# Patient Record
Sex: Male | Born: 1945
Health system: Southern US, Community
[De-identification: ages and names within clinical notes are randomized; demographics above are authoritative.]

## PROBLEM LIST (undated history)

## (undated) DIAGNOSIS — J111 Influenza due to unidentified influenza virus with other respiratory manifestations: Secondary | ICD-10-CM

## (undated) DIAGNOSIS — M545 Low back pain, unspecified: Secondary | ICD-10-CM

## (undated) DIAGNOSIS — M1990 Unspecified osteoarthritis, unspecified site: Secondary | ICD-10-CM

## (undated) DIAGNOSIS — K649 Unspecified hemorrhoids: Secondary | ICD-10-CM

## (undated) DIAGNOSIS — J329 Chronic sinusitis, unspecified: Secondary | ICD-10-CM

## (undated) DIAGNOSIS — K589 Irritable bowel syndrome without diarrhea: Secondary | ICD-10-CM

## (undated) DIAGNOSIS — I4581 Long QT syndrome: Secondary | ICD-10-CM

## (undated) DIAGNOSIS — E785 Hyperlipidemia, unspecified: Secondary | ICD-10-CM

## (undated) DIAGNOSIS — R55 Syncope and collapse: Secondary | ICD-10-CM

## (undated) HISTORY — PX: APPENDECTOMY: SHX54

## (undated) HISTORY — DX: Low back pain: M54.5

## (undated) HISTORY — DX: Hyperlipidemia, unspecified: E78.5

## (undated) HISTORY — DX: Unspecified osteoarthritis, unspecified site: M19.90

## (undated) HISTORY — PX: EYE SURGERY: SHX253

## (undated) HISTORY — PX: POLYPECTOMY: SHX149

## (undated) HISTORY — PX: WISDOM TOOTH EXTRACTION: SHX21

## (undated) HISTORY — DX: Long QT syndrome: I45.81

## (undated) HISTORY — DX: Irritable bowel syndrome, unspecified: K58.9

## (undated) HISTORY — PX: HYPERPLASIA TISSUE EXCISION: SHX5201

## (undated) HISTORY — DX: Unspecified hemorrhoids: K64.9

## (undated) HISTORY — PX: TONSILLECTOMY: SUR1361

## (undated) HISTORY — DX: Low back pain, unspecified: M54.50

---

## 1993-12-31 DIAGNOSIS — R55 Syncope and collapse: Secondary | ICD-10-CM

## 1993-12-31 HISTORY — DX: Syncope and collapse: R55

## 2004-05-17 ENCOUNTER — Encounter: Payer: Self-pay | Admitting: Internal Medicine

## 2004-12-01 ENCOUNTER — Ambulatory Visit: Payer: Self-pay | Admitting: Internal Medicine

## 2005-02-23 ENCOUNTER — Ambulatory Visit: Payer: Self-pay | Admitting: Internal Medicine

## 2005-03-27 ENCOUNTER — Ambulatory Visit: Payer: Self-pay | Admitting: Internal Medicine

## 2005-04-04 ENCOUNTER — Ambulatory Visit: Payer: Self-pay | Admitting: Internal Medicine

## 2005-04-05 ENCOUNTER — Ambulatory Visit: Payer: Self-pay | Admitting: Internal Medicine

## 2005-04-19 ENCOUNTER — Ambulatory Visit: Payer: Self-pay | Admitting: Internal Medicine

## 2005-04-30 ENCOUNTER — Ambulatory Visit: Payer: Self-pay | Admitting: Internal Medicine

## 2005-05-18 ENCOUNTER — Ambulatory Visit: Payer: Self-pay | Admitting: Internal Medicine

## 2005-06-01 ENCOUNTER — Ambulatory Visit: Payer: Self-pay | Admitting: Internal Medicine

## 2005-11-16 ENCOUNTER — Ambulatory Visit: Payer: Self-pay | Admitting: Internal Medicine

## 2005-11-16 ENCOUNTER — Emergency Department (HOSPITAL_COMMUNITY): Admission: EM | Admit: 2005-11-16 | Discharge: 2005-11-17 | Payer: Self-pay | Admitting: Emergency Medicine

## 2005-12-07 ENCOUNTER — Ambulatory Visit: Payer: Self-pay | Admitting: Internal Medicine

## 2005-12-12 ENCOUNTER — Ambulatory Visit (HOSPITAL_COMMUNITY): Admission: RE | Admit: 2005-12-12 | Discharge: 2005-12-12 | Payer: Self-pay | Admitting: Internal Medicine

## 2005-12-14 ENCOUNTER — Ambulatory Visit: Payer: Self-pay

## 2005-12-14 ENCOUNTER — Encounter: Payer: Self-pay | Admitting: Internal Medicine

## 2005-12-14 ENCOUNTER — Encounter: Payer: Self-pay | Admitting: Cardiology

## 2006-01-12 ENCOUNTER — Encounter: Admission: RE | Admit: 2006-01-12 | Discharge: 2006-01-12 | Payer: Self-pay | Admitting: *Deleted

## 2006-02-28 ENCOUNTER — Ambulatory Visit: Payer: Self-pay | Admitting: Internal Medicine

## 2006-03-18 ENCOUNTER — Ambulatory Visit: Payer: Self-pay | Admitting: Internal Medicine

## 2006-03-25 ENCOUNTER — Ambulatory Visit: Payer: Self-pay

## 2006-04-29 ENCOUNTER — Ambulatory Visit: Payer: Self-pay | Admitting: Internal Medicine

## 2006-06-18 ENCOUNTER — Ambulatory Visit: Payer: Self-pay | Admitting: Internal Medicine

## 2006-06-25 ENCOUNTER — Ambulatory Visit: Payer: Self-pay | Admitting: Internal Medicine

## 2006-12-31 HISTORY — PX: COLONOSCOPY: SHX174

## 2007-04-02 ENCOUNTER — Ambulatory Visit: Payer: Self-pay | Admitting: Internal Medicine

## 2007-04-04 ENCOUNTER — Ambulatory Visit: Payer: Self-pay | Admitting: Internal Medicine

## 2007-09-22 ENCOUNTER — Ambulatory Visit: Payer: Self-pay | Admitting: Internal Medicine

## 2007-09-22 LAB — CONVERTED CEMR LAB
AST: 60 units/L — ABNORMAL HIGH (ref 0–37)
Albumin: 4.2 g/dL (ref 3.5–5.2)
Alkaline Phosphatase: 62 units/L (ref 39–117)
Basophils Absolute: 0 10*3/uL (ref 0.0–0.1)
Basophils Relative: 0 % (ref 0.0–1.0)
Blood in Urine, dipstick: NEGATIVE
CO2: 32 meq/L (ref 19–32)
Chloride: 109 meq/L (ref 96–112)
Creatinine, Ser: 0.9 mg/dL (ref 0.4–1.5)
HCT: 43.9 % (ref 39.0–52.0)
MCHC: 33.9 g/dL (ref 30.0–36.0)
Neutrophils Relative %: 52.9 % (ref 43.0–77.0)
Nitrite: NEGATIVE
PSA: 0.67 ng/mL (ref 0.10–4.00)
Protein, U semiquant: NEGATIVE
RBC: 4.56 M/uL (ref 4.22–5.81)
RDW: 12.7 % (ref 11.5–14.6)
Sodium: 145 meq/L (ref 135–145)
Total Bilirubin: 1.2 mg/dL (ref 0.3–1.2)
Total CHOL/HDL Ratio: 3.1
Triglycerides: 58 mg/dL (ref 0–149)
Urobilinogen, UA: 0.2
VLDL: 12 mg/dL (ref 0–40)
WBC Urine, dipstick: NEGATIVE
WBC: 5.3 10*3/uL (ref 4.5–10.5)
pH: 8.5

## 2007-09-24 DIAGNOSIS — M545 Low back pain: Secondary | ICD-10-CM

## 2007-09-29 ENCOUNTER — Ambulatory Visit: Payer: Self-pay | Admitting: Internal Medicine

## 2007-09-29 DIAGNOSIS — E785 Hyperlipidemia, unspecified: Secondary | ICD-10-CM

## 2007-09-29 DIAGNOSIS — R945 Abnormal results of liver function studies: Secondary | ICD-10-CM | POA: Insufficient documentation

## 2007-09-29 LAB — CONVERTED CEMR LAB
ALT: 58 units/L — ABNORMAL HIGH (ref 0–53)
Alkaline Phosphatase: 69 units/L (ref 39–117)
HDL goal, serum: 40 mg/dL
LDL Goal: 130 mg/dL

## 2007-10-15 ENCOUNTER — Ambulatory Visit: Payer: Self-pay | Admitting: Gastroenterology

## 2007-12-11 ENCOUNTER — Ambulatory Visit: Payer: Self-pay | Admitting: Gastroenterology

## 2007-12-11 ENCOUNTER — Encounter: Payer: Self-pay | Admitting: Internal Medicine

## 2008-09-22 ENCOUNTER — Ambulatory Visit: Payer: Self-pay | Admitting: Internal Medicine

## 2008-09-22 LAB — CONVERTED CEMR LAB
Alkaline Phosphatase: 57 units/L (ref 39–117)
Bilirubin Urine: NEGATIVE
Bilirubin, Direct: 0.1 mg/dL (ref 0.0–0.3)
Cholesterol: 234 mg/dL (ref 0–200)
GFR calc Af Amer: 97 mL/min
Glucose, Bld: 88 mg/dL (ref 70–99)
HCT: 48.2 % (ref 39.0–52.0)
Ketones, urine, test strip: NEGATIVE
Lymphocytes Relative: 26.4 % (ref 12.0–46.0)
Monocytes Absolute: 0.7 10*3/uL (ref 0.1–1.0)
Monocytes Relative: 10.2 % (ref 3.0–12.0)
Platelets: 233 10*3/uL (ref 150–400)
Potassium: 4.3 meq/L (ref 3.5–5.1)
Protein, U semiquant: NEGATIVE
RDW: 12.2 % (ref 11.5–14.6)
Sodium: 143 meq/L (ref 135–145)
TSH: 1.8 microintl units/mL (ref 0.35–5.50)
Total Bilirubin: 1 mg/dL (ref 0.3–1.2)
Total CHOL/HDL Ratio: 2.7
Total Protein: 7.2 g/dL (ref 6.0–8.3)
Triglycerides: 55 mg/dL (ref 0–149)
Urobilinogen, UA: 0.2
VLDL: 11 mg/dL (ref 0–40)

## 2008-10-01 ENCOUNTER — Ambulatory Visit: Payer: Self-pay | Admitting: Internal Medicine

## 2008-10-01 DIAGNOSIS — R55 Syncope and collapse: Secondary | ICD-10-CM

## 2009-05-06 ENCOUNTER — Encounter: Payer: Self-pay | Admitting: *Deleted

## 2009-05-06 ENCOUNTER — Encounter: Payer: Self-pay | Admitting: Internal Medicine

## 2009-11-04 ENCOUNTER — Ambulatory Visit: Payer: Self-pay | Admitting: Internal Medicine

## 2009-11-04 LAB — CONVERTED CEMR LAB
ALT: 37 units/L (ref 0–53)
Albumin: 4.6 g/dL (ref 3.5–5.2)
Basophils Relative: 0.1 % (ref 0.0–3.0)
Bilirubin Urine: NEGATIVE
Bilirubin, Direct: 0.1 mg/dL (ref 0.0–0.3)
CO2: 30 meq/L (ref 19–32)
Chloride: 107 meq/L (ref 96–112)
Cholesterol: 240 mg/dL — ABNORMAL HIGH (ref 0–200)
Eosinophils Relative: 0.8 % (ref 0.0–5.0)
Glucose, Urine, Semiquant: NEGATIVE
HCT: 49.5 % (ref 39.0–52.0)
HDL: 81 mg/dL (ref >39.00)
Hemoglobin: 16.8 g/dL (ref 13.0–17.0)
Ketones, urine, test strip: NEGATIVE
MCV: 100.1 fL — ABNORMAL HIGH (ref 78.0–100.0)
Monocytes Absolute: 0.8 10*3/uL (ref 0.1–1.0)
Neutro Abs: 4.5 10*3/uL (ref 1.4–7.7)
Neutrophils Relative %: 62.2 % (ref 43.0–77.0)
Nitrite: NEGATIVE
PSA: 1.01 ng/mL (ref 0.10–4.00)
RBC: 4.95 M/uL (ref 4.22–5.81)
Specific Gravity, Urine: 1.02
Total Protein: 7.1 g/dL (ref 6.0–8.3)
Triglycerides: 67 mg/dL (ref 0.0–149.0)
WBC: 7.2 10*3/uL (ref 4.5–10.5)
pH: 6

## 2009-11-22 ENCOUNTER — Ambulatory Visit: Payer: Self-pay | Admitting: Internal Medicine

## 2010-02-24 ENCOUNTER — Telehealth: Payer: Self-pay | Admitting: Internal Medicine

## 2010-04-19 ENCOUNTER — Ambulatory Visit: Payer: Self-pay | Admitting: Internal Medicine

## 2010-04-19 DIAGNOSIS — F5102 Adjustment insomnia: Secondary | ICD-10-CM

## 2010-04-26 ENCOUNTER — Telehealth: Payer: Self-pay | Admitting: Internal Medicine

## 2010-05-02 ENCOUNTER — Encounter: Payer: Self-pay | Admitting: Internal Medicine

## 2011-01-30 NOTE — Miscellaneous (Signed)
Summary: [facility]  [facility]   Imported By: Maryln Gottron 05/16/2010 09:22:54  _____________________________________________________________________  External Attachment:    Type:   Image     Comment:   External Document

## 2011-01-30 NOTE — Assessment & Plan Note (Signed)
Summary: BACK PAIN // RS   Vital Signs:  Patient profile:   65 year old male Height:      69 inches Weight:      164 pounds BMI:     24.31 Temp:     98.2 degrees F oral Pulse rate:   68 / minute Resp:     14 per minute BP sitting:   120 / 76  (left arm)  Vitals Entered By: Willy Eddy, LPN (April 19, 2010 2:41 PM) CC: c/o low back pain after reaching over to pick up a book at work on 4-14-, Back Pain   CC:  c/o low back pain after reaching over to pick up a book at work on 4-14- and Back Pain.  History of Present Illness: last thirsday at work reached and felt a pain in the lower back to IBU and used heat and this has improved over 6 days The pt also sufffers with insomnia and the pain has increased this problem  Back Pain History:      The patient's back pain started approximately 04/13/2010.  The pain is located in the lower back region and does not radiate below the knees.  He states this is work related.  On a scale of 1-10, he describes the pain as a 6.  He states that he has no prior history of back pain.  The patient has not had any recent physical therapy for his back pain.     Preventive Screening-Counseling & Management  Alcohol-Tobacco     Smoking Status: quit     Year Quit: 1978  Problems Prior to Update: 1)  Syncope  (ICD-780.2) 2)  Abnormal Result, Function Study, Liver  (ICD-794.8) 3)  Hyperlipidemia  (ICD-272.4) 4)  Low Back Pain  (ICD-724.2) 5)  Physical Examination  (ICD-V70.0)  Current Problems (verified): 1)  Syncope  (ICD-780.2) 2)  Abnormal Result, Function Study, Liver  (ICD-794.8) 3)  Hyperlipidemia  (ICD-272.4) 4)  Low Back Pain  (ICD-724.2) 5)  Physical Examination  (ICD-V70.0)  Medications Prior to Update: 1)  Advil 100 Mg  Tabs (Ibuprofen) .... As Needed 2)  Melatonin 3 Mg Tabs (Melatonin) .Marland Kitchen.. 1 At Bedtime As Needed Sleep 3)  Zolpidem Tartrate 5 Mg Tabs (Zolpidem Tartrate) .... One By Mouth Q Hs As Needed Insomnia  Current  Medications (verified): 1)  Advil 100 Mg  Tabs (Ibuprofen) .... As Needed 2)  Melatonin 3 Mg Tabs (Melatonin) .Marland Kitchen.. 1 At Bedtime As Needed Sleep 3)  Zolpidem Tartrate 5 Mg Tabs (Zolpidem Tartrate) .... One By Mouth Q Hs As Needed Insomnia  Allergies (verified): 1)  ! * Mycins  Past History:  Family History: Last updated: 09/29/2007 Family History of Arthritis Family History Hypertension Family History of Prostate CA 1st degree relative <50  Social History: Last updated: 09/24/2007 Occupation: Married Current Smoker Alcohol use-yes Drug use-no  Risk Factors: Smoking Status: quit (04/19/2010)  Past medical, surgical, family and social histories (including risk factors) reviewed, and no changes noted (except as noted below).  Past Medical History: Reviewed history from 09/29/2007 and no changes required. IBS Hemorrhoid Low back pain Hyperlipidemia  Past Surgical History: Reviewed history from 09/24/2007 and no changes required. Flexible Sigmoidoscopy Appendectomy  Family History: Reviewed history from 09/29/2007 and no changes required. Family History of Arthritis Family History Hypertension Family History of Prostate CA 1st degree relative <50  Social History: Reviewed history from 09/24/2007 and no changes required. Occupation: Married Current Smoker Alcohol use-yes Drug use-no  Review of  Systems  The patient denies anorexia, fever, weight loss, weight gain, vision loss, decreased hearing, hoarseness, chest pain, syncope, dyspnea on exertion, peripheral edema, prolonged cough, headaches, hemoptysis, abdominal pain, melena, hematochezia, severe indigestion/heartburn, hematuria, incontinence, genital sores, muscle weakness, suspicious skin lesions, transient blindness, difficulty walking, depression, unusual weight change, abnormal bleeding, enlarged lymph nodes, angioedema, and breast masses.         back pain  Physical Exam  General:   Well-developed,well-nourished,in no acute distress; alert,appropriate and cooperative throughout examination Eyes:  No corneal or conjunctival inflammation noted. EOMI. Perrla. Funduscopic exam benign, without hemorrhages, exudates or papilledema. Vision grossly normal. Chest Wall:  No deformities, masses, tenderness or gynecomastia noted. Lungs:  Normal respiratory effort, chest expands symmetrically. Lungs are clear to auscultation, no crackles or wheezes. Heart:  normal rate and regular rhythm.   Abdomen:  soft, non-tender, normal bowel sounds, and no distention.   Msk:  lumbar lordosis, SI joint tenderness, and trigger point tenderness.   Extremities:  No clubbing, cyanosis, edema, or deformity noted with normal full range of motion of all joints.   Neurologic:  No cranial nerve deficits noted. Station and gait are normal. Plantar reflexes are down-going bilaterally. DTRs are symmetrical throughout. Sensory, motor and coordinative functions appear intact.   Impression & Recommendations:  Problem # 1:  LOW BACK PAIN (ICD-724.2)  His updated medication list for this problem includes:    Advil 100 Mg Tabs (Ibuprofen) .Marland Kitchen... As needed  Discussed use of moist heat or ice, modified activities, medications, and stretching/strengthening exercises. Back care instructions given. To be seen in 2 weeks if no improvement; sooner if worsening of symptoms.   Orders: T-Lumbar Spine Comp w/[facility] [phone])  Problem # 2:  TRANSIENT DISORDER INITIATING/MAINTAINING SLEEP (ICD-307.41) the use of advil PM and somnapure  Complete Medication List: 1)  Advil 100 Mg Tabs (Ibuprofen) .... As needed 2)  Melatonin 3 Mg Tabs (Melatonin) .Marland Kitchen.. 1 at bedtime as needed sleep 3)  Zolpidem Tartrate 5 Mg Tabs (Zolpidem tartrate) .... One by mouth q hs as needed insomnia  Patient Instructions: 1)  get xray today and take the muscle relaxants at night for rest 2)  Most patients (90%) with low back pain will improve  with time (2-6 weeks). Keep active but avoid activities that are painful. Apply moist heat and/or ice to lower back several times a day.

## 2011-01-30 NOTE — Progress Notes (Signed)
Summary: Sleep RX  Phone Note Call from Patient   Caller: Patient Call For: Stacie Glaze MD Summary of Call: Pt is calling to ask Dr. Lovell Sheehan if he can have a RX for sleep. [facility] taking [facility]il [facility] and is not helping. [phone] Initial call taken by: Lynann Beaver CMA,  February 24, 2010 11:21 AM  Follow-up for Phone Call        per drjenkins- may have zolpidem5 mg # 20 1 at bedtime as needed sleep Follow-up by: Willy Eddy, LPN,  February 24, 2010 3:46 PM    New/Updated Medications: ZOLPIDEM TARTRATE 5 MG TABS (ZOLPIDEM TARTRATE) one by mouth q hs as needed insomnia Prescriptions: ZOLPIDEM TARTRATE 5 MG TABS (ZOLPIDEM TARTRATE) one by mouth q hs as needed insomnia  #20 x 1   Entered by:   Lynann Beaver CMA   Authorized by:   Stacie Glaze MD   Signed by:   Lynann Beaver CMA on 02/24/2010   Method used:   Telephoned to ...       [facility]  [phone]* (retail)       [address] [place], [place]  [address]       Ph: [phone] or [phone]       Fax: [phone]   RxID:   [id number]

## 2011-01-30 NOTE — Progress Notes (Signed)
Summary: PT  Phone Note Call from Patient Call back at Work Phone [phone]   Summary of Call: Have considered & want to have PT.   Next week unavailble Tues am.  Please set up. Initial call taken by: Rudy Jew, RN,  April 26, 2010 2:45 PM

## 2011-04-25 ENCOUNTER — Encounter: Payer: Self-pay | Admitting: Internal Medicine

## 2011-04-26 ENCOUNTER — Other Ambulatory Visit (INDEPENDENT_AMBULATORY_CARE_PROVIDER_SITE_OTHER): Payer: BC Managed Care – PPO | Admitting: Internal Medicine

## 2011-04-26 DIAGNOSIS — Z0000 Encounter for general adult medical examination without abnormal findings: Secondary | ICD-10-CM

## 2011-04-26 DIAGNOSIS — Z13220 Encounter for screening for lipoid disorders: Secondary | ICD-10-CM

## 2011-04-26 LAB — CBC WITH DIFFERENTIAL/PLATELET
Basophils Absolute: 0 10*3/uL (ref 0.0–0.1)
Basophils Relative: 0.3 % (ref 0.0–3.0)
Eosinophils Absolute: 0 10*3/uL (ref 0.0–0.7)
Eosinophils Relative: 0.8 % (ref 0.0–5.0)
HCT: 48.5 % (ref 39.0–52.0)
Hemoglobin: 16.7 g/dL (ref 13.0–17.0)
Lymphocytes Relative: 31.6 % (ref 12.0–46.0)
Lymphs Abs: 1.9 10*3/uL (ref 0.7–4.0)
MCHC: 34.5 g/dL (ref 30.0–36.0)
MCV: 97.6 fl (ref 78.0–100.0)
Monocytes Absolute: 0.7 10*3/uL (ref 0.1–1.0)
Monocytes Relative: 11.2 % (ref 3.0–12.0)
Neutro Abs: 3.4 10*3/uL (ref 1.4–7.7)
Neutrophils Relative %: 56.1 % (ref 43.0–77.0)
Platelets: 218 10*3/uL (ref 150.0–400.0)
RBC: 4.97 Mil/uL (ref 4.22–5.81)
RDW: 13.7 % (ref 11.5–14.6)
WBC: 6 10*3/uL (ref 4.5–10.5)

## 2011-04-26 LAB — BASIC METABOLIC PANEL
BUN: 17 mg/dL (ref 6–23)
CO2: 28 mEq/L (ref 19–32)
Calcium: 9.3 mg/dL (ref 8.4–10.5)
Chloride: 105 mEq/L (ref 96–112)
Creatinine, Ser: 1 mg/dL (ref 0.4–1.5)
GFR: 83.63 mL/min (ref >60.00)
Glucose, Bld: 88 mg/dL (ref 70–99)
Potassium: 5 mEq/L (ref 3.5–5.1)
Sodium: 143 mEq/L (ref 135–145)

## 2011-04-26 LAB — LDL CHOLESTEROL, DIRECT: Direct LDL: 140.9 mg/dL

## 2011-04-26 LAB — LIPID PANEL
Cholesterol: 245 mg/dL — ABNORMAL HIGH (ref 0–200)
HDL: 85.6 mg/dL (ref >39.00)
Total CHOL/HDL Ratio: 3
Triglycerides: 63 mg/dL (ref 0.0–149.0)
VLDL: 12.6 mg/dL (ref 0.0–40.0)

## 2011-04-26 LAB — POCT URINALYSIS DIPSTICK
Nitrite, UA: NEGATIVE
Urobilinogen, UA: 0.2
pH, UA: 5

## 2011-04-26 LAB — HEPATIC FUNCTION PANEL: Total Bilirubin: 1.2 mg/dL (ref 0.3–1.2)

## 2011-04-26 LAB — TSH: TSH: 2.21 u[IU]/mL (ref 0.35–5.50)

## 2011-05-03 ENCOUNTER — Ambulatory Visit (INDEPENDENT_AMBULATORY_CARE_PROVIDER_SITE_OTHER): Payer: BC Managed Care – PPO | Admitting: Internal Medicine

## 2011-05-03 ENCOUNTER — Encounter: Payer: Self-pay | Admitting: Internal Medicine

## 2011-05-03 VITALS — BP 116/74 | HR 72 | Temp 98.2°F | Resp 14 | Ht 70.0 in | Wt 168.0 lb

## 2011-05-03 DIAGNOSIS — Z0000 Encounter for general adult medical examination without abnormal findings: Secondary | ICD-10-CM

## 2011-05-03 DIAGNOSIS — L570 Actinic keratosis: Secondary | ICD-10-CM | POA: Insufficient documentation

## 2011-05-03 NOTE — Progress Notes (Signed)
  Subjective:    Patient ID: Kristopher Martinez, male    DOB: May 13, 1946, 65 y.o.   MRN: [id number]  HPI cpx Hearing loss And left leg pain with sitting and with use of the recumbent bike Has actinic keratosis on the forehead  Review of Systems  Constitutional: Negative for fever and fatigue.  HENT: Negative for hearing loss, congestion, neck pain and postnasal drip.   Eyes: Negative for discharge, redness and visual disturbance.  Respiratory: Negative for cough, shortness of breath and wheezing.   Cardiovascular: Negative for leg swelling.  Gastrointestinal: Negative for abdominal pain, constipation and abdominal distention.  Genitourinary: Negative for urgency and frequency.  Musculoskeletal: Positive for back pain and arthralgias. Negative for joint swelling.       Left leg stiff  Skin: Negative for color change and rash.  Neurological: Negative for weakness and light-headedness.  Hematological: Negative for adenopathy.  Psychiatric/Behavioral: Negative for behavioral problems.       Past Medical History  Diagnosis Date  . IBS (irritable bowel syndrome)   . Hemorrhoid   . Low back pain   . Hyperlipidemia    Past Surgical History  Procedure Date  . Appendectomy     reports that he quit smoking about 34 years ago. He does not have any smokeless tobacco history on file. He reports that he drinks alcohol. He reports that he does not use illicit drugs. family history includes Alcohol abuse in his mother; Cancer in his father; Colitis in his mother; and Prostate cancer in an unspecified family member. No Known Allergies  Objective:   Physical Exam  Constitutional: He is oriented to person, place, and time. He appears well-developed and well-nourished.  HENT:  Head: Normocephalic and atraumatic.  Eyes: Conjunctivae are normal. Pupils are equal, round, and reactive to light.  Neck: Normal range of motion. Neck supple.  Cardiovascular: Normal rate and regular rhythm.     Pulmonary/Chest: Effort normal and breath sounds normal.  Abdominal: Soft. Bowel sounds are normal.  Genitourinary: Prostate normal.  Musculoskeletal: Normal range of motion.  Neurological: He is alert and oriented to person, place, and time.  Skin: Skin is warm and dry.  Psychiatric: He has a normal mood and affect. His behavior is normal. Thought content normal.          Assessment & Plan:   Patient presents for yearly preventative medicine examination.   all immunizations and health maintenance protocols were reviewed with the patient and they are up to date with these protocols.   screening laboratory values were reviewed with the patient including screening of hyperlipidemia PSA renal function and hepatic function.   There medications past medical history social history problem list and allergies were reviewed in detail.   Goals were established with regard to weight loss exercise diet in compliance with medications

## 2011-05-03 NOTE — Assessment & Plan Note (Signed)
Patient has a 0.4 cm area of skin ulceration on his right for head is consistent with an actinic keratosis  Informed consent was obtained in the lesion was treated for 60 seconds of liquid nitrogen application the patient tolerated the procedure well as procedural care was discussed with the patient and instructions should the lesion reappears contact our office immediately

## 2011-12-05 ENCOUNTER — Ambulatory Visit (INDEPENDENT_AMBULATORY_CARE_PROVIDER_SITE_OTHER): Payer: Medicare Other | Admitting: Internal Medicine

## 2011-12-05 DIAGNOSIS — Z23 Encounter for immunization: Secondary | ICD-10-CM

## 2011-12-27 ENCOUNTER — Encounter: Payer: Self-pay | Admitting: Internal Medicine

## 2011-12-27 DIAGNOSIS — Z23 Encounter for immunization: Secondary | ICD-10-CM

## 2011-12-27 NOTE — Patient Instructions (Signed)
Shot given today

## 2012-05-08 ENCOUNTER — Encounter: Payer: Self-pay | Admitting: Internal Medicine

## 2012-05-08 ENCOUNTER — Ambulatory Visit (INDEPENDENT_AMBULATORY_CARE_PROVIDER_SITE_OTHER): Payer: Medicare Other | Admitting: Internal Medicine

## 2012-05-08 VITALS — BP 120/80 | HR 68 | Temp 98.0°F | Resp 14 | Ht 70.0 in | Wt 160.0 lb

## 2012-05-08 DIAGNOSIS — N400 Enlarged prostate without lower urinary tract symptoms: Secondary | ICD-10-CM

## 2012-05-08 DIAGNOSIS — E785 Hyperlipidemia, unspecified: Secondary | ICD-10-CM

## 2012-05-08 DIAGNOSIS — Z0000 Encounter for general adult medical examination without abnormal findings: Secondary | ICD-10-CM

## 2012-05-08 DIAGNOSIS — H9319 Tinnitus, unspecified ear: Secondary | ICD-10-CM

## 2012-05-08 DIAGNOSIS — T887XXA Unspecified adverse effect of drug or medicament, initial encounter: Secondary | ICD-10-CM

## 2012-05-08 LAB — LDL CHOLESTEROL, DIRECT: Direct LDL: 135.6 mg/dL

## 2012-05-08 LAB — CBC WITH DIFFERENTIAL/PLATELET
Basophils Relative: 0.3 % (ref 0.0–3.0)
Eosinophils Absolute: 0.1 10*3/uL (ref 0.0–0.7)
HCT: 47.3 % (ref 39.0–52.0)
Lymphs Abs: 1.7 10*3/uL (ref 0.7–4.0)
MCHC: 33.4 g/dL (ref 30.0–36.0)
MCV: 98.5 fl (ref 78.0–100.0)
Monocytes Absolute: 0.6 10*3/uL (ref 0.1–1.0)
Neutro Abs: 3 10*3/uL (ref 1.4–7.7)
Neutrophils Relative %: 55.7 % (ref 43.0–77.0)
RBC: 4.81 Mil/uL (ref 4.22–5.81)

## 2012-05-08 LAB — POCT URINALYSIS DIPSTICK
Leukocytes, UA: NEGATIVE
Nitrite, UA: NEGATIVE
Protein, UA: NEGATIVE
Urobilinogen, UA: 0.2
pH, UA: 7

## 2012-05-08 LAB — BASIC METABOLIC PANEL
BUN: 18 mg/dL (ref 6–23)
CO2: 26 mEq/L (ref 19–32)
Chloride: 106 mEq/L (ref 96–112)
Creatinine, Ser: 0.8 mg/dL (ref 0.4–1.5)

## 2012-05-08 LAB — HEPATIC FUNCTION PANEL
Albumin: 4.2 g/dL (ref 3.5–5.2)
Bilirubin, Direct: 0.1 mg/dL (ref 0.0–0.3)
Total Protein: 6.6 g/dL (ref 6.0–8.3)

## 2012-05-08 LAB — PSA: PSA: 1.19 ng/mL (ref 0.10–4.00)

## 2012-05-08 LAB — TSH: TSH: 1.69 u[IU]/mL (ref 0.35–5.50)

## 2012-05-08 NOTE — Patient Instructions (Addendum)
The patient is instructed to continue all medications as prescribed. Schedule followup with check out clerk upon leaving the clinic   "practical paleo"  

## 2012-05-08 NOTE — Progress Notes (Signed)
Subjective:    Patient ID: Kristopher Martinez, male    DOB: 1946/10/14, 66 y.o.   MRN: [id number]  HPI  Patient is a 66 year old male who presents for a Medicare examination.  This is a welcome to Medicare exam. We reviewed his current medications current problem list and we look at his immunization history and health history today.  We discussed the hyperlipidemia we discussed his history of actinic keratosis and we will do a skin examination as a part of today's visit.  Patient has been doing generally well has no reported problems other than increased tinnitus and possible hearing loss he indicates that the hearing loss may be greater in his left side than his right side he states that his wife has reported that there is some hearing loss.  Review of Systems  Constitutional: Negative for fever and fatigue.  HENT: Negative for hearing loss, congestion, neck pain and postnasal drip.   Eyes: Negative for discharge, redness and visual disturbance.  Respiratory: Negative for cough, shortness of breath and wheezing.   Cardiovascular: Negative for leg swelling.  Gastrointestinal: Negative for abdominal pain, constipation and abdominal distention.  Genitourinary: Negative for urgency and frequency.  Musculoskeletal: Negative for joint swelling and arthralgias.  Skin: Negative for color change and rash.  Neurological: Negative for weakness and light-headedness.  Hematological: Negative for adenopathy.  Psychiatric/Behavioral: Negative for behavioral problems.   Past Medical History  Diagnosis Date  . IBS (irritable bowel syndrome)   . Hemorrhoid   . Low back pain   . Hyperlipidemia     History   Social History  . Marital Status: Married    Spouse Name: N/A    Number of Children: N/A  . Years of Education: N/A   Occupational History  . Not on file.   Social History Main Topics  . Smoking status: Former Smoker    Quit date: 12/31/1976  . Smokeless tobacco: Not on file  .  Alcohol Use: Yes  . Drug Use: No  . Sexually Active: Yes   Other Topics Concern  . Not on file   Social History Narrative  . No narrative on file    Past Surgical History  Procedure Date  . Appendectomy     Family History  Problem Relation Age of Onset  . Prostate cancer    . Colitis Mother   . Alcohol abuse Mother   . Cancer Father     prostate cancer    No Known Allergies  Current Outpatient Prescriptions on File Prior to Visit  Medication Sig Dispense Refill  . ibuprofen (ADVIL,MOTRIN) 100 MG tablet Take 100 mg by mouth every 6 (six) hours as needed.        . Melatonin 3 MG CAPS Take by mouth at bedtime as needed.          BP 120/80  Pulse 68  Temp 98 F (36.7 C)  Resp 14  Ht 5\' 10"  (1.778 m)  Wt 160 lb (72.576 kg)  BMI 22.96 kg/m2       Objective:   Physical Exam  Nursing note and vitals reviewed. Constitutional: He appears well-developed and well-nourished.  HENT:  Head: Normocephalic and atraumatic.  Eyes: Conjunctivae are normal. Pupils are equal, round, and reactive to light.  Neck: Normal range of motion. Neck supple.  Cardiovascular: Normal rate and regular rhythm.   Pulmonary/Chest: Effort normal and breath sounds normal.  Abdominal: Soft. Bowel sounds are normal.          Assessment &  Plan:  We'll measure a lipid and liver today as well as appropriate screening blood work that will include a CBC and a basic metabolic panel for 995.2. The skin examination check for actinic keratosis and treat as necessary.  Will complete the protocol for initial Medicare welcome to Medicare exam Subjective:    Kristopher Martinez is a 66 y.o. male who presents for a welcome to Medicare exam.   Cardiac risk factors: dyslipidemia and male gender.  Depression Screen (Note: if answer to either of the following is "Yes", a more complete depression screening is indicated)  Q1: Over the past two weeks, have you felt down, depressed or hopeless? no Q2: Over  the past two weeks, have you felt little interest or pleasure in doing things? no  Activities of Daily Living In your present state of health, do you have any difficulty performing the following activities?:  Preparing food and eating?: No Bathing yourself: No Getting dressed: No Using the toilet:No Moving around from place to place: No In the past year have you fallen or had a near fall?:No  Current exercise habits: Gym/ health club routine includes cardio and light weights.  Dietary issues discussed: none  Hearing difficulties: Yes Safe in current home environment: yes  The following portions of the patient's history were reviewed and updated as appropriate: allergies, current medications, past family history, past medical history, past social history, past surgical history and problem list. Review of Systems A comprehensive review of systems was negative.    Objective:     Vision by Snellen chart: right eye:20/20, left eye:20/20 Blood pressure 120/80, pulse 68, temperature 98 F (36.7 C), resp. rate 14, height 5\' 10"  (1.778 m), weight 160 lb (72.576 kg). Body mass index is 22.96 kg/(m^2). BP 120/80  Pulse 68  Temp 98 F (36.7 C)  Resp 14  Ht 5\' 10"  (1.778 m)  Wt 160 lb (72.576 kg)  BMI 22.96 kg/m2  General Appearance:    Alert, cooperative, no distress, appears stated age  Head:    Normocephalic, without obvious abnormality, atraumatic  Eyes:    PERRL, conjunctiva/corneas clear, EOM's intact, fundi    benign, both eyes       Ears:    Normal TM's and external ear canals, both ears  Nose:   Nares normal, septum midline, mucosa normal, no drainage    or sinus tenderness  Throat:   Lips, mucosa, and tongue normal; teeth and gums normal  Neck:   Supple, symmetrical, trachea midline, no adenopathy;       thyroid:  No enlargement/tenderness/nodules; no carotid   bruit or JVD  Back:     Symmetric, no curvature, ROM normal, no CVA tenderness  Lungs:     Clear to auscultation  bilaterally, respirations unlabored  Chest wall:    No tenderness or deformity  Heart:    Regular rate and rhythm, S1 and S2 normal, no murmur, rub   or gallop  Abdomen:     Soft, non-tender, bowel sounds active all four quadrants,    no masses, no organomegaly  Genitalia:    Normal male without lesion, discharge or tenderness  Rectal:    Normal tone, normal prostate, no masses or tenderness;   guaiac negative stool  Extremities:   Extremities normal, atraumatic, no cyanosis or edema  Pulses:   2+ and symmetric all extremities  Skin:   Skin color, texture, turgor normal, no rashes or lesions  Lymph nodes:   Cervical, supraclavicular, and axillary nodes normal  Neurologic:   CNII-XII intact. Normal strength, sensation and reflexes      throughout      Assessment:     Patient presents for yearly preventative medicine examination.   all immunizations and health maintenance protocols were reviewed with the patient and they are up to date with these protocols.   screening laboratory values were reviewed with the patient including screening of hyperlipidemia PSA renal function and hepatic function.   There medications past medical history social history problem list and allergies were reviewed in detail.   Goals were established with regard to weight loss exercise diet in compliance with medications      Plan:     During the course of the visit the patient was educated and counseled about appropriate screening and preventive services including:   Pneumococcal vaccine   Influenza vaccine  Advanced directives: power of attorney for healthcare on file  Patient Instructions (the written plan) was given to the patient.

## 2012-05-14 ENCOUNTER — Ambulatory Visit: Payer: BC Managed Care – PPO | Admitting: Internal Medicine

## 2012-12-04 ENCOUNTER — Ambulatory Visit (INDEPENDENT_AMBULATORY_CARE_PROVIDER_SITE_OTHER): Payer: Medicare Other | Admitting: Internal Medicine

## 2012-12-04 DIAGNOSIS — Z23 Encounter for immunization: Secondary | ICD-10-CM

## 2013-06-05 ENCOUNTER — Encounter: Payer: Self-pay | Admitting: Internal Medicine

## 2013-06-05 ENCOUNTER — Other Ambulatory Visit: Payer: Self-pay | Admitting: *Deleted

## 2013-06-05 ENCOUNTER — Ambulatory Visit (INDEPENDENT_AMBULATORY_CARE_PROVIDER_SITE_OTHER): Payer: Medicare Other | Admitting: Internal Medicine

## 2013-06-05 VITALS — BP 120/80 | HR 72 | Temp 98.3°F | Resp 16 | Ht 70.0 in | Wt 160.0 lb

## 2013-06-05 DIAGNOSIS — Z0000 Encounter for general adult medical examination without abnormal findings: Secondary | ICD-10-CM

## 2013-06-05 DIAGNOSIS — E785 Hyperlipidemia, unspecified: Secondary | ICD-10-CM

## 2013-06-05 DIAGNOSIS — Z23 Encounter for immunization: Secondary | ICD-10-CM

## 2013-06-05 LAB — HEPATIC FUNCTION PANEL
AST: 29 U/L (ref 0–37)
Albumin: 4.2 g/dL (ref 3.5–5.2)
Alkaline Phosphatase: 47 U/L (ref 39–117)
Total Protein: 6.8 g/dL (ref 6.0–8.3)

## 2013-06-05 LAB — POCT URINALYSIS DIPSTICK
Ketones, UA: NEGATIVE
Spec Grav, UA: 1.02
Urobilinogen, UA: 0.2
pH, UA: 8.5

## 2013-06-05 LAB — CBC WITH DIFFERENTIAL/PLATELET
Basophils Absolute: 0 10*3/uL (ref 0.0–0.1)
Basophils Relative: 0.3 % (ref 0.0–3.0)
Eosinophils Absolute: 0.1 10*3/uL (ref 0.0–0.7)
HCT: 47 % (ref 39.0–52.0)
Hemoglobin: 15.9 g/dL (ref 13.0–17.0)
Lymphocytes Relative: 30 % (ref 12.0–46.0)
Lymphs Abs: 1.5 10*3/uL (ref 0.7–4.0)
MCHC: 33.9 g/dL (ref 30.0–36.0)
Monocytes Relative: 12.7 % — ABNORMAL HIGH (ref 3.0–12.0)
Neutro Abs: 2.8 10*3/uL (ref 1.4–7.7)
RBC: 4.75 Mil/uL (ref 4.22–5.81)
RDW: 13.8 % (ref 11.5–14.6)

## 2013-06-05 LAB — LDL CHOLESTEROL, DIRECT: Direct LDL: 114.9 mg/dL

## 2013-06-05 LAB — BASIC METABOLIC PANEL
CO2: 26 mEq/L (ref 19–32)
Glucose, Bld: 85 mg/dL (ref 70–99)
Potassium: 4.3 mEq/L (ref 3.5–5.1)
Sodium: 140 mEq/L (ref 135–145)

## 2013-06-05 LAB — TSH: TSH: 1.85 u[IU]/mL (ref 0.35–5.50)

## 2013-06-05 LAB — LIPID PANEL
Cholesterol: 228 mg/dL — ABNORMAL HIGH (ref 0–200)
Total CHOL/HDL Ratio: 2
VLDL: 7.4 mg/dL (ref 0.0–40.0)

## 2013-06-05 NOTE — Progress Notes (Signed)
Subjective:    Patient ID: Kristopher Martinez, male    DOB: 05/03/1946, 67 y.o.   MRN: [id number]  HPI CPX   Review of Systems  Constitutional: Negative for fever and fatigue.  HENT: Negative for hearing loss, congestion, neck pain and postnasal drip.   Eyes: Negative for discharge, redness and visual disturbance.  Respiratory: Negative for cough, shortness of breath and wheezing.   Cardiovascular: Negative for leg swelling.  Gastrointestinal: Negative for abdominal pain, constipation and abdominal distention.  Genitourinary: Negative for urgency and frequency.  Musculoskeletal: Negative for joint swelling and arthralgias.  Skin: Negative for color change and rash.  Neurological: Negative for weakness and light-headedness.  Hematological: Negative for adenopathy.  Psychiatric/Behavioral: Negative for behavioral problems.   Past Medical History  Diagnosis Date  . IBS (irritable bowel syndrome)   . Hemorrhoid   . Low back pain   . Hyperlipidemia     History   Social History  . Marital Status: Married    Spouse Name: N/A    Number of Children: N/A  . Years of Education: N/A   Occupational History  . Not on file.   Social History Main Topics  . Smoking status: Former Smoker    Quit date: 12/31/1976  . Smokeless tobacco: Not on file  . Alcohol Use: Yes  . Drug Use: No  . Sexually Active: Yes   Other Topics Concern  . Not on file   Social History Narrative  . No narrative on file    Past Surgical History  Procedure Laterality Date  . Appendectomy      Family History  Problem Relation Age of Onset  . Prostate cancer    . Colitis Mother   . Alcohol abuse Mother   . Cancer Father     prostate cancer    No Known Allergies  Current Outpatient Prescriptions on File Prior to Visit  Medication Sig Dispense Refill  . ibuprofen (ADVIL,MOTRIN) 100 MG tablet Take 100 mg by mouth every 6 (six) hours as needed.        . Melatonin 3 MG CAPS Take by mouth at bedtime  as needed.         No current facility-administered medications on file prior to visit.    BP 120/80  Pulse 72  Temp(Src) 98.3 F (36.8 C)  Resp 16  Ht 5\' 10"  (1.778 m)  Wt 160 lb (72.576 kg)  BMI 22.96 kg/m2    Patient presents for yearly preventative medicine examination.   all immunizations and health maintenance protocols were reviewed with the patient and they are up to date with these protocols.   screening laboratory values were reviewed with the patient including screening of hyperlipidemia PSA renal function and hepatic function.   There medications past medical history social history problem list and allergies were reviewed in detail.   Goals were established with regard to weight loss exercise diet in compliance with medications     Objective:   Physical Exam  Nursing note and vitals reviewed. Constitutional: He is oriented to person, place, and time. He appears well-developed and well-nourished.  HENT:  Head: Normocephalic and atraumatic.  Eyes: Conjunctivae are normal. Pupils are equal, round, and reactive to light.  Neck: Normal range of motion. Neck supple.  Cardiovascular: Normal rate and regular rhythm.   Murmur heard. Pulmonary/Chest: Effort normal and breath sounds normal.  Abdominal: Soft. Bowel sounds are normal.  Genitourinary: Rectum normal and prostate normal.  Musculoskeletal: Normal range of motion.  Neurological:  He is alert and oriented to person, place, and time.  Skin: Skin is warm and dry.  Psychiatric: He has a normal mood and affect. His behavior is normal.          Assessment & Plan:   Patient presents for yearly preventative medicine examination.   all immunizations and health maintenance protocols were reviewed with the patient and they are up to date with these protocols.   screening laboratory values were reviewed with the patient including screening of hyperlipidemia PSA renal function and hepatic function.   There  medications past medical history social history problem list and allergies were reviewed in detail.   Goals were established with regard to weight loss exercise diet in compliance with medications Rare presyncopal events One in over a year Related most closely to alcohol without food No sz symptom or palpitations No atypical migrain symptoms

## 2013-06-09 ENCOUNTER — Encounter: Payer: Self-pay | Admitting: Internal Medicine

## 2013-09-21 ENCOUNTER — Telehealth: Payer: Self-pay | Admitting: Internal Medicine

## 2013-09-21 NOTE — Telephone Encounter (Signed)
Pt states his left elbow has been bothering him when he lifts things or puts weight on it at the gym. Pt would like to know if you would refer him to someone. Pt thinks this is a tendon or arthitis. pls advise.

## 2013-10-08 ENCOUNTER — Encounter: Payer: Self-pay | Admitting: Internal Medicine

## 2013-10-08 ENCOUNTER — Ambulatory Visit (INDEPENDENT_AMBULATORY_CARE_PROVIDER_SITE_OTHER): Payer: Medicare Other | Admitting: Internal Medicine

## 2013-10-08 VITALS — BP 108/80 | HR 74 | Temp 98.3°F | Wt 166.0 lb

## 2013-10-08 DIAGNOSIS — K4190 Unilateral femoral hernia, without obstruction or gangrene, not specified as recurrent: Secondary | ICD-10-CM

## 2013-10-08 DIAGNOSIS — Z23 Encounter for immunization: Secondary | ICD-10-CM

## 2013-10-08 NOTE — Patient Instructions (Signed)
Call or return to clinic prn if these symptoms worsen or fail to improve as anticipated. Hernia A hernia occurs when an internal organ pushes out through a weak spot in the abdominal wall. Hernias most commonly occur in the groin and around the navel. Hernias often can be pushed back into place (reduced). Most hernias tend to get worse over time. Some abdominal hernias can get stuck in the opening (irreducible or incarcerated hernia) and cannot be reduced. An irreducible abdominal hernia which is tightly squeezed into the opening is at risk for impaired blood supply (strangulated hernia). A strangulated hernia is a medical emergency. Because of the risk for an irreducible or strangulated hernia, surgery may be recommended to repair a hernia. CAUSES   Heavy lifting.  Prolonged coughing.  Straining to have a bowel movement.  A cut (incision) made during an abdominal surgery. HOME CARE INSTRUCTIONS   Bed rest is not required. You may continue your normal activities.  Avoid lifting more than 10 pounds (4.5 kg) or straining.  Cough gently. If you are a smoker it is best to stop. Even the best hernia repair can break down with the continual strain of coughing. Even if you do not have your hernia repaired, a cough will continue to aggravate the problem.  Do not wear anything tight over your hernia. Do not try to keep it in with an outside bandage or truss. These can damage abdominal contents if they are trapped within the hernia sac.  Eat a normal diet.  Avoid constipation. Straining over long periods of time will increase hernia size and encourage breakdown of repairs. If you cannot do this with diet alone, stool softeners may be used. SEEK IMMEDIATE MEDICAL CARE IF:   You have a fever.  You develop increasing abdominal pain.  You feel nauseous or vomit.  Your hernia is stuck outside the abdomen, looks discolored, feels hard, or is tender.  You have any changes in your bowel habits or in  the hernia that are unusual for you.  You have increased pain or swelling around the hernia.  You cannot push the hernia back in place by applying gentle pressure while lying down. MAKE SURE YOU:   Understand these instructions.  Will watch your condition.  Will get help right away if you are not doing well or get worse. Document Released: 12/17/2005 Document Revised: 03/10/2012 Document Reviewed: 08/05/2008 [facility] Patient Information 2014 [place], [place]. Hernia, Surgical Repair A hernia occurs when an internal organ pushes out through a weak spot in the belly (abdominal) wall muscles. Hernias commonly occur in the groin and around the navel. Hernias often can be pushed back into place (reduced). Most hernias tend to get worse over time. Problems occur when abdominal contents get stuck in the opening (incarcerated hernia). The blood supply gets cut off (strangulated hernia). This is an emergency and needs surgery. Otherwise, hernia repair can be an elective procedure. This means you can schedule this at your convenience when an emergency is not present. Because complications can occur, if you decide to repair the hernia, it is best to do it soon. When it becomes an emergency procedure, there is increased risk of complications after surgery. CAUSES   Heavy lifting.  Obesity.  Prolonged coughing.  Straining to move your bowels.  Hernias can also occur through a cut (incision) by a surgeonafter an abdominal operation. HOME CARE INSTRUCTIONS Before the repair:  Bed rest is not required. You may continue your normal activities, but avoid heavy lifting (  more than 10 pounds) or straining. Cough gently. If you are a smoker, it is best to stop. Even the best hernia repair can break down with the continual strain of coughing.  Do not wear anything tight over your hernia. Do not try to keep it in with an outside bandage or truss. These can damage abdominal contents if they are trapped in the  hernia sac.  Eat a normal diet. Avoid constipation. Straining over long periods of time to have a bowel movement will increase hernia size. It also can breakdown repairs. If you cannot do this with diet alone, laxatives or stool softeners may be used. PRIOR TO SURGERY, SEEK IMMEDIATE MEDICAL CARE IF: You have problems (symptoms) of a trapped (incarcerated) hernia. Symptoms include:  An oral temperature above 102 F (38.9 C) develops, or as your caregiver suggests.  Increasing abdominal pain.  Feeling sick to your stomach(nausea) and vomiting.  You stop passing gas or stool.  The hernia is stuck outside the abdomen, looks discolored, feels hard, or is tender.  You have any changes in your bowel habits or in the hernia that is unusual for you. LET YOUR CAREGIVERS KNOW ABOUT THE FOLLOWING:  Allergies.  Medications taken including herbs, eye drops, over the counter medications, and creams.  Use of steroids (by mouth or creams).  Family or personal history of problems with anesthetics or Novocaine.  Possibility of pregnancy, if this applies.  Personal history of blood clots (thrombophlebitis).  Family or personal history of bleeding or blood problems.  Previous surgery.  Other health problems. BEFORE THE PROCEDURE You should be present 1 hour prior to your procedure, or as directed by your caregiver.  AFTER THE PROCEDURE After surgery, you will be taken to the recovery area. A nurse will watch and check your progress there. Once you are awake, stable, and taking fluids well, you will be allowed to go home as long as there are no problems. Once home, an ice pack (wrapped in a light towel) applied to your operative site may help with discomfort. It may also keep the swelling down. Do not lift anything heavier than 10 pounds (4.55 kilograms). Take showers not baths. Do not drive while taking narcotics. Follow instructions as suggested by your caregiver.  SEEK IMMEDIATE MEDICAL CARE  IF: After surgery:  There is redness, swelling, or increasing pain in the wound.  There is pus coming from the wound.  There is drainage from a wound lasting longer than 1 day.  An unexplained oral temperature above 102 F (38.9 C) develops.  You notice a foul smell coming from the wound or dressing.  There is a breaking open of a wound (edged not staying together) after the sutures have been removed.  You notice increasing pain in the shoulders (shoulder strap areas).  You develop dizzy episodes or fainting while standing.  You develop persistent nausea or vomiting.  You develop a rash.  You have difficulty breathing.  You develop any reaction or side effects to medications given. MAKE SURE YOU:   Understand these instructions.  Will watch your condition.  Will get help right away if you are not doing well or get worse. Document Released: 06/12/2001 Document Revised: 03/10/2012 Document Reviewed: 05/04/2008 [facility] Patient Information 2014 [place], [place].

## 2013-10-08 NOTE — Progress Notes (Signed)
Subjective:    Patient ID: Kristopher Martinez, male    DOB: [birth year], 67 y.o.   MRN: [id number]  HPI  67 year old patient who is in today for evaluation of a painless mass in the right groin area.  He states that in the 70s he had a abnormal left gland in this region that was resected and was benign. He is quite active and does exercise regularly at his health club. There has been no pain  Past Medical History  Diagnosis Date  . IBS (irritable bowel syndrome)   . Hemorrhoid   . Low back pain   . Hyperlipidemia     History   Social History  . Marital Status: Married    Spouse Name: N/A    Number of Children: N/A  . Years of Education: N/A   Occupational History  . Not on file.   Social History Main Topics  . Smoking status: Former Smoker    Quit date: 12/31/1976  . Smokeless tobacco: Not on file  . Alcohol Use: Yes  . Drug Use: No  . Sexual Activity: Yes   Other Topics Concern  . Not on file   Social History Narrative  . No narrative on file    Past Surgical History  Procedure Laterality Date  . Appendectomy      Family History  Problem Relation Age of Onset  . Prostate cancer    . Colitis Mother   . Alcohol abuse Mother   . Cancer Father     prostate cancer    No Known Allergies  Current Outpatient Prescriptions on File Prior to Visit  Medication Sig Dispense Refill  . ibuprofen (ADVIL,MOTRIN) 100 MG tablet Take 100 mg by mouth every 6 (six) hours as needed.        . Melatonin 3 MG CAPS Take by mouth at bedtime as needed.        Marland Kitchen glucosamine-chondroitin 500-400 MG tablet Take 1 tablet by mouth daily.       No current facility-administered medications on file prior to visit.    BP 108/80  Pulse 74  Temp(Src) 98.3 F (36.8 C) (Oral)  Wt 166 lb (75.297 kg)  BMI 23.82 kg/m2  SpO2 98%       Review of Systems  Constitutional: Negative for fever, chills, appetite change and fatigue.  HENT: Negative for congestion, dental problem, ear pain,  hearing loss, sore throat, tinnitus, trouble swallowing and voice change.   Eyes: Negative for pain, discharge and visual disturbance.  Respiratory: Negative for cough, chest tightness, wheezing and stridor.   Cardiovascular: Negative for chest pain, palpitations and leg swelling.  Gastrointestinal: Negative for nausea, vomiting, abdominal pain, diarrhea, constipation, blood in stool and abdominal distention.  Genitourinary: Negative for urgency, hematuria, flank pain, discharge, difficulty urinating and genital sores.  Musculoskeletal: Negative for arthralgias, back pain, gait problem, joint swelling, myalgias and neck stiffness.  Skin: Negative for rash.  Neurological: Negative for dizziness, syncope, speech difficulty, weakness, numbness and headaches.  Hematological: Negative for adenopathy. Does not bruise/bleed easily.  Psychiatric/Behavioral: Negative for behavioral problems and dysphoric mood. The patient is not nervous/anxious.        Objective:   Physical Exam  Constitutional: He is oriented to person, place, and time. He appears well-developed.  HENT:  Head: Normocephalic.  Right Ear: External ear normal.  Left Ear: External ear normal.  Eyes: Conjunctivae and EOM are normal.  Neck: Normal range of motion.  Cardiovascular: Normal rate and normal heart sounds.  Pulmonary/Chest: Breath sounds normal.  Abdominal: Bowel sounds are normal.  Easily  reducible right femoral hernia noted  Musculoskeletal: Normal range of motion. He exhibits no edema and no tenderness.  Neurological: He is alert and oriented to person, place, and time.  Psychiatric: He has a normal mood and affect. His behavior is normal.          Assessment & Plan:   Right femoral hernia. Information was dispensed. He'll call if he develops any symptoms or enlargement.

## 2013-10-12 ENCOUNTER — Telehealth: Payer: Self-pay | Admitting: Internal Medicine

## 2013-10-12 DIAGNOSIS — K4090 Unilateral inguinal hernia, without obstruction or gangrene, not specified as recurrent: Secondary | ICD-10-CM

## 2013-10-12 NOTE — Telephone Encounter (Signed)
Pt would like to proceed w/ hernia removal. Seen and dx by dr Kirtland Bouchard 10/09. pls advise

## 2013-10-12 NOTE — Telephone Encounter (Signed)
Please schedule an appointment with Gen. surgery due to right inguinal hernia

## 2013-10-12 NOTE — Telephone Encounter (Signed)
Spoke to pt told him referral was sent for General Surgeon and someone will be in contact with him. Pt verbalized understanding.

## 2013-10-15 ENCOUNTER — Telehealth (INDEPENDENT_AMBULATORY_CARE_PROVIDER_SITE_OTHER): Payer: Self-pay | Admitting: General Surgery

## 2013-10-15 ENCOUNTER — Ambulatory Visit (INDEPENDENT_AMBULATORY_CARE_PROVIDER_SITE_OTHER): Payer: Medicare Other | Admitting: General Surgery

## 2013-10-15 ENCOUNTER — Encounter (INDEPENDENT_AMBULATORY_CARE_PROVIDER_SITE_OTHER): Payer: Self-pay | Admitting: General Surgery

## 2013-10-15 VITALS — BP 118/86 | HR 84 | Temp 98.2°F | Resp 15 | Ht 70.0 in | Wt 167.0 lb

## 2013-10-15 DIAGNOSIS — K4090 Unilateral inguinal hernia, without obstruction or gangrene, not specified as recurrent: Secondary | ICD-10-CM | POA: Insufficient documentation

## 2013-10-15 NOTE — Progress Notes (Signed)
Patient ID: Kristopher Martinez, male   DOB: [birth year], 67 y.o.   MRN: [id number]  Chief Complaint  Patient presents with  . New Evaluation    eval RIH    HPI Kristopher Martinez is a 67 y.o. male.   HPI 67 year old Caucasian male referred by Dr. Darryll Capers for evaluation of a right inguinal hernia. The patient states that he noticed a lump in his groin about 2 weeks ago. The lump is most noticeable when he is lifting objects and when he is working in his garden. He doesn't really cause him any discomfort. He just notices that it's there and it does bother him because it does stick out. He denies any stinging or burning in his groin. He denies any nausea or vomiting. He denies any diarrhea or constipation. He denies any difficulty urinating. He denies any nocturia. He has had a prior vasectomy. He has had a prior excision in his right groin for atypical lymphoid tissue in the 70s. He states that he had a lump in that area that was surgically removed and it was atypical lymphoid tissue. He states that he was never diagnosed with lymphoma per se. Past Medical History  Diagnosis Date  . IBS (irritable bowel syndrome)   . Hemorrhoid   . Low back pain   . Hyperlipidemia     Past Surgical History  Procedure Laterality Date  . Appendectomy    . Hyperplasia tissue excision      atypical lymphoid     Family History  Problem Relation Age of Onset  . Prostate cancer    . Colitis Mother   . Alcohol abuse Mother   . Cancer Father     prostate cancer    Social History History  Substance Use Topics  . Smoking status: Former Smoker    Quit date: 12/31/1976  . Smokeless tobacco: Never Used  . Alcohol Use: Yes     Comment: wine daily    No Known Allergies  Current Outpatient Prescriptions  Medication Sig Dispense Refill  . ibuprofen (ADVIL,MOTRIN) 100 MG tablet Take 100 mg by mouth every 6 (six) hours as needed.        . Melatonin 3 MG CAPS Take by mouth at bedtime as needed.         No  current facility-administered medications for this visit.    Review of Systems Review of Systems  Constitutional: Negative for fever, chills, appetite change and unexpected weight change.  HENT: Negative for congestion and trouble swallowing.   Eyes: Negative for visual disturbance.  Respiratory: Negative for chest tightness and shortness of breath.   Cardiovascular: Negative for chest pain and leg swelling.       No PND, no orthopnea, no DOE  Gastrointestinal: Negative for nausea, vomiting, abdominal pain, diarrhea and constipation.       See HPI  Genitourinary: Negative for dysuria, hematuria and difficulty urinating.  Musculoskeletal: Negative.   Skin: Negative for rash.  Neurological: Negative for seizures and speech difficulty.  Hematological: Does not bruise/bleed easily.  Psychiatric/Behavioral: Negative for behavioral problems and confusion.    Blood pressure 118/86, pulse 84, temperature 98.2 F (36.8 C), temperature source Temporal, resp. rate 15, height 5\' 10"  (1.778 m), weight 167 lb (75.751 kg).  Physical Exam Physical Exam  Vitals reviewed. Constitutional: He is oriented to person, place, and time. He appears well-developed and well-nourished. No distress.  HENT:  Head: Normocephalic and atraumatic.  Right Ear: External ear normal.  Left Ear: External  ear normal.  Eyes: Conjunctivae are normal. No scleral icterus.  Neck: Normal range of motion. Neck supple. No tracheal deviation present. No thyromegaly present.  Cardiovascular: Normal rate, normal heart sounds and intact distal pulses.   Pulmonary/Chest: Effort normal and breath sounds normal. No respiratory distress. He has no wheezes.  Abdominal: Soft. He exhibits no distension. There is no tenderness. There is no rebound and no guarding. A hernia is present. Hernia confirmed positive in the right inguinal area. Hernia confirmed negative in the left inguinal area.    Genitourinary: Testes normal and penis  normal.     Reducible non-tender right inguinal hernia; patient examined supine and standing; he has an oblique incision in his right groin just above this anterior celiac spine. He also has a fainter transverse incision in his right midabdomen off to the side.  Musculoskeletal: Normal range of motion. He exhibits no edema and no tenderness.  Lymphadenopathy:    He has no cervical adenopathy.       Right: No inguinal adenopathy present.       Left: No inguinal adenopathy present.  Neurological: He is alert and oriented to person, place, and time. He exhibits normal muscle tone.  Skin: Skin is warm and dry. No rash noted. He is not diaphoretic. No erythema. No pallor.  Psychiatric: He has a normal mood and affect. His behavior is normal. Judgment and thought content normal.    Data Reviewed Dr. Charm Rings note from October 9  Assessment    Right inguinal hernia     Plan    We discussed the etiology of inguinal hernias. We discussed the signs & symptoms of incarceration & strangulation.  We discussed non-operative and operative management. We discussed both open as well as laparoscopic approaches. Because he has a prior right inguinal incision I recommended a laparoscopic approach  The patient has elected To proceed with a laparoscopic repair of a right inguinal hernia with mesh.   I described the procedure in detail.  The patient was given educational material. We discussed the risks and benefits including but not limited to bleeding, infection, chronic inguinal pain, nerve entrapment, hernia recurrence, mesh complications, hematoma formation, urinary retention, injury to the testicles, numbness in the groin, blood clots, injury to the surrounding structures, and anesthesia risk. We also discussed the typical post operative recovery course, including no heavy lifting for 4-6 weeks. I explained that the likelihood of improvement of their symptoms is Good   He is going to wait to have surgery  until December because he has a family obligation-trip coming up. He was instructed to contact our office when he is ready to schedule surgery  Mary Sella. Andrey Campanile, MD, FACS General, Bariatric, & Minimally Invasive Surgery [facility], [place]        [facility] M 10/15/2013, 12:15 PM

## 2013-10-15 NOTE — Telephone Encounter (Signed)
Called pt to notify him to stop any blood thinners that he may be on 5 days prior to surgery if he decides to go through with the surgery

## 2013-10-15 NOTE — Patient Instructions (Addendum)
Please call when you are ready to schedule surgery. Please call at least 6 weeks before your desired surgery week Please send me an email if you have trouble reaching our schedulers.   Hernia Repair with Laparoscope A hernia occurs when an internal organ pushes out through a weak spot in the belly (abdominal) wall muscles. Hernias most commonly occur in the groin and around the navel. Hernias can also occur through a cut by the surgeon (incision) after an abdominal operation. A hernia may be caused by:  Lifting heavy objects.  Prolonged coughing.  Straining to move your bowels. Hernias can often be pushed back into place (reduced). Most hernias tend to get worse over time. Problems occur when abdominal contents get stuck in the opening and the blood supply is blocked or impaired (incarcerated hernia). Because of these risks, you require surgery to repair the hernia. Your hernia will be repaired using a laparoscope. Laparoscopic surgery is a type of minimally invasive surgery. It does not involve making a typical surgical cut (incision) in the skin. A laparoscope is a telescope-like rod and lens system. It is usually connected to a video camera and a light source so your caregiver can clearly see the operative area. The instruments are inserted through  to  inch (5 mm or 10 mm) openings in the skin at specific locations. A working and viewing space is created by blowing a small amount of carbon dioxide gas into the abdominal cavity. The abdomen is essentially blown up like a balloon (insufflated). This elevates the abdominal wall above the internal organs like a dome. The carbon dioxide gas is common to the human body and can be absorbed by tissue and removed by the respiratory system. Once the repair is completed, the small incisions will be closed with either stitches (sutures) or staples (just like a paper stapler only this staple holds the skin together). LET YOUR CAREGIVERS KNOW  ABOUT:  Allergies.  Medications taken including herbs, eye drops, over the counter medications, and creams.  Use of steroids (by mouth or creams).  Previous problems with anesthetics or Novocaine.  Possibility of pregnancy, if this applies.  History of blood clots (thrombophlebitis).  History of bleeding or blood problems.  Previous surgery.  Other health problems. BEFORE THE PROCEDURE  Laparoscopy can be done either in a hospital or out-patient clinic. You may be given a mild sedative to help you relax before the procedure. Once in the operating room, you will be given a general anesthesia to make you sleep (unless you and your caregiver choose a different anesthetic).  AFTER THE PROCEDURE  After the procedure you will be watched in a recovery area. Depending on what type of hernia was repaired, you might be admitted to the hospital or you might go home the same day. With this procedure you may have less pain and scarring. This usually results in a quicker recovery and less risk of infection. HOME CARE INSTRUCTIONS   Bed rest is not required. You may continue your normal activities but avoid heavy lifting (more than 10 pounds) or straining.  Cough gently. If you are a smoker it is best to stop, as even the best hernia repair can break down with the continual strain of coughing.  Avoid driving until given the OK by your surgeon.  There are no dietary restrictions unless given otherwise.  TAKE ALL MEDICATIONS AS DIRECTED.  Only take over-the-counter or prescription medicines for pain, discomfort, or fever as directed by your caregiver. SEEK MEDICAL  CARE IF:   There is increasing abdominal pain or pain in your incisions.  There is more bleeding from incisions, other than minimal spotting.  You feel light headed or faint.  You develop an unexplained fever, chills, and/or an oral temperature above 102 F (38.9 C).  You have redness, swelling, or increasing pain in the  wound.  Pus coming from wound.  A foul smell coming from the wound or dressings. SEEK IMMEDIATE MEDICAL CARE IF:   You develop a rash.  You have difficulty breathing.  You have any allergic problems. MAKE SURE YOU:   Understand these instructions.  Will watch your condition.  Will get help right away if you are not doing well or get worse. Document Released: 12/17/2005 Document Revised: 03/10/2012 Document Reviewed: 11/16/2009 [facility] Patient Information 2014 [place], [place].

## 2013-11-03 ENCOUNTER — Encounter: Payer: Self-pay | Admitting: Internal Medicine

## 2013-11-25 ENCOUNTER — Encounter (INDEPENDENT_AMBULATORY_CARE_PROVIDER_SITE_OTHER): Payer: Self-pay | Admitting: General Surgery

## 2014-02-02 ENCOUNTER — Encounter (INDEPENDENT_AMBULATORY_CARE_PROVIDER_SITE_OTHER): Payer: Self-pay | Admitting: General Surgery

## 2014-03-10 ENCOUNTER — Ambulatory Visit (INDEPENDENT_AMBULATORY_CARE_PROVIDER_SITE_OTHER): Payer: Medicare Other | Admitting: General Surgery

## 2014-03-10 ENCOUNTER — Encounter (INDEPENDENT_AMBULATORY_CARE_PROVIDER_SITE_OTHER): Payer: Self-pay | Admitting: General Surgery

## 2014-03-10 VITALS — BP 134/80 | HR 80 | Temp 97.8°F | Resp 16 | Ht 70.0 in | Wt 161.0 lb

## 2014-03-10 DIAGNOSIS — K4090 Unilateral inguinal hernia, without obstruction or gangrene, not specified as recurrent: Secondary | ICD-10-CM

## 2014-03-10 MED ORDER — TAMSULOSIN HCL 0.4 MG PO CAPS: 0.4000 mg | ORAL_CAPSULE | Freq: Every day | ORAL | Status: DC

## 2014-03-10 NOTE — Progress Notes (Signed)
Surgery on 03/22/14.  Preop  on 03/17/14.  Need orders in EPIC.Marland Kitchen  Thank You.

## 2014-03-10 NOTE — Patient Instructions (Signed)
Pick up Flomax prescription at your pharmacy

## 2014-03-11 ENCOUNTER — Other Ambulatory Visit (INDEPENDENT_AMBULATORY_CARE_PROVIDER_SITE_OTHER): Payer: Self-pay | Admitting: General Surgery

## 2014-03-11 NOTE — Progress Notes (Signed)
Patient ID: Kristopher Martinez, male   DOB: 03/06/1946, 67 y.o.   MRN: [id number]  Chief Complaint  Patient presents with  . Routine Post Op    HPI Kristopher Martinez is a 67 y.o. male.  HPI 67-year-old Caucasian male comes back in to rediscuss his right inguinal hernia. I initially met him in October 2014. Since that time he has done extensive research regarding inguinal hernia repair. He entertained going to [place] to getting his hernia repaired without mesh at the [facility]. However after communicating with him being male and his ongoing research he is here today to schedule laparoscopic inguinal hernia repair. He continues to have no pain or discomfort. However the bulge is more prominent at times. He denies any abdominal pain, nausea, vomiting, diarrhea or constipation. Past Medical History  Diagnosis Date  . IBS (irritable bowel syndrome)   . Hemorrhoid   . Low back pain   . Hyperlipidemia     Past Surgical History  Procedure Laterality Date  . Appendectomy    . Hyperplasia tissue excision      atypical lymphoid     Family History  Problem Relation Age of Onset  . Prostate cancer    . Colitis Mother   . Alcohol abuse Mother   . Cancer Father     prostate cancer    Social History History  Substance Use Topics  . Smoking status: Former Smoker    Quit date: 12/31/1976  . Smokeless tobacco: Never Used  . Alcohol Use: Yes     Comment: wine daily    No Known Allergies  Current Outpatient Prescriptions  Medication Sig Dispense Refill  . ibuprofen (ADVIL,MOTRIN) 100 MG tablet Take 100 mg by mouth every 6 (six) hours as needed.        . Melatonin 3 MG CAPS Take by mouth at bedtime as needed.        . tamsulosin (FLOMAX) 0.4 MG CAPS capsule Take 1 capsule (0.4 mg total) by mouth daily. Start taking 4 days before surgery including morning of surgery  7 capsule  0   No current facility-administered medications for this visit.    Review of Systems Review of Systems    Constitutional: Negative for fever, chills, appetite change and unexpected weight change.  HENT: Negative for congestion and trouble swallowing.   Eyes: Negative for visual disturbance.  Respiratory: Negative for chest tightness and shortness of breath.   Cardiovascular: Negative for chest pain and leg swelling.       No PND, no orthopnea, no DOE  Gastrointestinal:       See HPI  Genitourinary: Negative for dysuria and hematuria.  Musculoskeletal: Negative.   Skin: Negative for rash.  Neurological: Negative for seizures and speech difficulty.  Hematological: Does not bruise/bleed easily.  Psychiatric/Behavioral: Negative for behavioral problems and confusion.    Blood pressure 134/80, pulse 80, temperature 97.8 F (36.6 C), temperature source Oral, resp. rate 16, height 5' 10" (1.778 m), weight 161 lb (73.029 kg).  Physical Exam Physical Exam  Constitutional: He is oriented to person, place, and time. He appears well-developed and well-nourished. No distress.  HENT:  Head: Normocephalic and atraumatic.  Right Ear: External ear normal.  Left Ear: External ear normal.  Eyes: Conjunctivae are normal. No scleral icterus.  Neck: Normal range of motion. Neck supple. No tracheal deviation present. No thyromegaly present.  Cardiovascular: Normal rate, normal heart sounds and intact distal pulses.   Pulmonary/Chest: Effort normal and breath sounds normal. No   respiratory distress. He has no wheezes.  Abdominal: Soft. He exhibits no distension. There is no tenderness. There is no rebound and no guarding. A hernia is present. Hernia confirmed positive in the right inguinal area. Hernia confirmed negative in the left inguinal area.    Genitourinary: Testes normal and penis normal.  Musculoskeletal: Normal range of motion. He exhibits no edema and no tenderness.  Lymphadenopathy:    He has no cervical adenopathy.  Neurological: He is alert and oriented to person, place, and time. He exhibits  normal muscle tone.  Skin: Skin is warm and dry. No rash noted. He is not diaphoretic. No erythema. No pallor.  Psychiatric: He has a normal mood and affect. His behavior is normal. Judgment and thought content normal.    Data Reviewed My office note and emails with pt  Assessment    Right inguinal hernia     Plan    We re discussed the etiology of inguinal hernias. We discussed the signs & symptoms of incarceration & strangulation.  We discussed non-operative and operative management.  The patient has elected to proceed with LAPAROSCOPIC REPAIR OF RIGHT INGUINAL HERNIA WITH MESH   I re-described the procedure in detail.  The patient was given educational material. We discussed the risks and benefits including but not limited to bleeding, infection, chronic inguinal pain, nerve entrapment, hernia recurrence, mesh complications, hematoma formation, urinary retention, injury to the testicles, numbness in the groin, blood clots, injury to the surrounding structures, and anesthesia risk. We also discussed the typical post operative recovery course, including no heavy lifting for 4-6 weeks. I explained that the likelihood of improvement of their symptoms is good  He will meet with the schedulers today to schedule surgery. He was given a prescription for perioperative Flomax to decrease his risk of postoperative urinary retention.  Eric M. Wilson, MD, FACS General, Bariatric, & Minimally Invasive Surgery [facility]          WILSON,ERIC M 03/11/2014, 1:30 PM    

## 2014-03-15 ENCOUNTER — Encounter (HOSPITAL_COMMUNITY): Payer: Self-pay | Admitting: Pharmacy Technician

## 2014-03-17 ENCOUNTER — Encounter (HOSPITAL_COMMUNITY)
Admission: RE | Admit: 2014-03-17 | Discharge: 2014-03-17 | Disposition: A | Discharge status: Home / Self Care | Payer: Medicare Other | Source: Ambulatory Visit | Attending: General Surgery | Admitting: General Surgery

## 2014-03-17 ENCOUNTER — Encounter (HOSPITAL_COMMUNITY): Payer: Self-pay

## 2014-03-17 DIAGNOSIS — Z01812 Encounter for preprocedural laboratory examination: Secondary | ICD-10-CM | POA: Insufficient documentation

## 2014-03-17 HISTORY — DX: Syncope and collapse: R55

## 2014-03-17 LAB — BASIC METABOLIC PANEL
BUN: 15 mg/dL (ref 6–23)
CO2: 27 mEq/L (ref 19–32)
Calcium: 9.3 mg/dL (ref 8.4–10.5)
Chloride: 103 mEq/L (ref 96–112)
Creatinine, Ser: 0.91 mg/dL (ref 0.50–1.35)
GFR calc Af Amer: >90 mL/min (ref >90)
GFR, EST NON AFRICAN AMERICAN: 86 mL/min — AB (ref >90)
GLUCOSE: 95 mg/dL (ref 70–99)
Potassium: 4.4 mEq/L (ref 3.7–5.3)
Sodium: 141 mEq/L (ref 137–147)

## 2014-03-17 LAB — CBC
HEMATOCRIT: 46.7 % (ref 39.0–52.0)
Hemoglobin: 16.5 g/dL (ref 13.0–17.0)
MCH: 33.3 pg (ref 26.0–34.0)
MCHC: 35.3 g/dL (ref 30.0–36.0)
MCV: 94.2 fL (ref 78.0–100.0)
Platelets: 218 10*3/uL (ref 150–400)
RBC: 4.96 MIL/uL (ref 4.22–5.81)
RDW: 13.4 % (ref 11.5–15.5)
WBC: 5.6 10*3/uL (ref 4.0–10.5)

## 2014-03-17 NOTE — Patient Instructions (Addendum)
20     Your procedure is scheduled on:  Monday 03/22/2014  Report to [facility] at  [place] AM.  Call this number if you have problems the night before or morning of surgery: [phone]   Remember:             IF YOU USE CPAP,BRING MASK AND TUBING AM OF SURGERY!             IF YOU DO NOT HAVE YOUR TYPE AND SCREEN DRAWN AT PRE-ADMIT APPOINTMENT, YOU WILL HAVE IT DRAWN AM OF SURGERY!   Do not eat food or drink liquids AFTER MIDNIGHT!  Take these medicines the morning of surgery with A SIP OF WATER: NONE    [facility] IS NOT RESPONSIBLE FOR ANY BELONGINGS OR VALUABLES BROUGHT TO HOSPITAL.  Marland Kitchen  Leave suitcase in the car. After surgery it may be brought to your room.  For patients admitted to the hospital, checkout time is 11:00 AM the day of              Discharge.    DO NOT WEAR  JEWELRY,MAKE-UP,LOTIONS,POWDERS,PERFUMES,CONTACTS , DENTURES OR BRIDGEWORK ,AND DO NOT WEAR FALSE EYELASHES                                    Patients discharged the day of surgery will not be allowed to drive home. If going home the same day of surgery, must have someone stay with you first 24 hrs.at home and arrange for someone to drive you home from the  [place]: N/A   Special Instructions:              Please read over the following fact sheets that you were given:             1. [place].Majesty Stehlin,RN,BSN     [id number]                FAILURE TO FOLLOW THESE INSTRUCTIONS MAY RESULT IN  CANCELLATION OF YOUR SURGERY!               Patient Signature:___________________________

## 2014-03-22 ENCOUNTER — Ambulatory Visit: Payer: Medicare Other | Admitting: Family

## 2014-03-22 ENCOUNTER — Ambulatory Visit (HOSPITAL_COMMUNITY): Admission: RE | Admit: 2014-03-22 | Payer: Medicare Other | Source: Ambulatory Visit | Admitting: General Surgery

## 2014-03-22 ENCOUNTER — Telehealth (INDEPENDENT_AMBULATORY_CARE_PROVIDER_SITE_OTHER): Payer: Self-pay | Admitting: General Surgery

## 2014-03-22 ENCOUNTER — Encounter (HOSPITAL_COMMUNITY): Admission: RE | Payer: Self-pay | Source: Ambulatory Visit

## 2014-03-22 SURGERY — REPAIR, HERNIA, INGUINAL, LAPAROSCOPIC
Anesthesia: General | Laterality: Right

## 2014-03-22 NOTE — Telephone Encounter (Signed)
Called patient and he stated that his fever broke over night and I looked at his apt in [place] and [place]. Has moved him to 04-08-14 @ 12:30 to 2:00. So I transferred the patient to Hilda Blades to let him talk to him, he did not know that they reschedule him for surgery

## 2014-03-29 ENCOUNTER — Encounter: Payer: Self-pay | Admitting: Internal Medicine

## 2014-03-29 DIAGNOSIS — J019 Acute sinusitis, unspecified: Secondary | ICD-10-CM

## 2014-03-29 MED ORDER — AMOXICILLIN-POT CLAVULANATE 875-125 MG PO TABS: 1.0000 | ORAL_TABLET | Freq: Two times a day (BID) | ORAL | Status: DC

## 2014-03-31 ENCOUNTER — Encounter (HOSPITAL_COMMUNITY): Payer: Self-pay | Admitting: *Deleted

## 2014-03-31 NOTE — Progress Notes (Signed)
Patient's surgery was originally scheduled from 03/23/14.  Preop appointment was on 03/17/14.  Patient became sick with ? Flu and sinusitis and surgery moved to 04/08/14.  Reviewed surgery date and time along with preop instructions and updated medication history.

## 2014-04-08 ENCOUNTER — Ambulatory Visit (HOSPITAL_COMMUNITY)
Admission: RE | Admit: 2014-04-08 | Discharge: 2014-04-08 | Disposition: A | Discharge status: Home / Self Care | Payer: Medicare Other | Source: Ambulatory Visit | Attending: General Surgery | Admitting: General Surgery

## 2014-04-08 ENCOUNTER — Ambulatory Visit (HOSPITAL_COMMUNITY): Payer: Medicare Other | Admitting: Certified Registered Nurse Anesthetist

## 2014-04-08 ENCOUNTER — Encounter (HOSPITAL_COMMUNITY): Payer: Self-pay | Admitting: *Deleted

## 2014-04-08 ENCOUNTER — Encounter (HOSPITAL_COMMUNITY): Payer: Medicare Other | Admitting: Certified Registered Nurse Anesthetist

## 2014-04-08 ENCOUNTER — Encounter (HOSPITAL_COMMUNITY)
Admission: RE | Disposition: A | Discharge status: Home / Self Care | Payer: Self-pay | Source: Ambulatory Visit | Attending: General Surgery

## 2014-04-08 DIAGNOSIS — E785 Hyperlipidemia, unspecified: Secondary | ICD-10-CM | POA: Insufficient documentation

## 2014-04-08 DIAGNOSIS — K4090 Unilateral inguinal hernia, without obstruction or gangrene, not specified as recurrent: Secondary | ICD-10-CM

## 2014-04-08 DIAGNOSIS — Z9089 Acquired absence of other organs: Secondary | ICD-10-CM | POA: Insufficient documentation

## 2014-04-08 DIAGNOSIS — Z87891 Personal history of nicotine dependence: Secondary | ICD-10-CM | POA: Insufficient documentation

## 2014-04-08 HISTORY — PX: INSERTION OF MESH: SHX5868

## 2014-04-08 HISTORY — DX: Influenza due to unidentified influenza virus with other respiratory manifestations: J11.1

## 2014-04-08 HISTORY — PX: INGUINAL HERNIA REPAIR: SHX194

## 2014-04-08 HISTORY — DX: Chronic sinusitis, unspecified: J32.9

## 2014-04-08 SURGERY — REPAIR, HERNIA, INGUINAL, LAPAROSCOPIC
Anesthesia: General | Site: Abdomen | Laterality: Right

## 2014-04-08 MED ORDER — FENTANYL CITRATE 0.05 MG/ML IJ SOLN
INTRAMUSCULAR | Status: DC | PRN
  Administered 2014-04-08: 100 ug via INTRAVENOUS
  Administered 2014-04-08 (×3): 50 ug via INTRAVENOUS

## 2014-04-08 MED ORDER — CEFAZOLIN SODIUM-DEXTROSE 2-3 GM-% IV SOLR
2.0000 g | INTRAVENOUS | Status: AC
  Administered 2014-04-08: 2 g via INTRAVENOUS

## 2014-04-08 MED ORDER — PROPOFOL 10 MG/ML IV BOLUS
INTRAVENOUS | Status: AC
  Filled 2014-04-08: qty 20

## 2014-04-08 MED ORDER — 0.9 % SODIUM CHLORIDE (POUR BTL) OPTIME
TOPICAL | Status: DC | PRN
  Administered 2014-04-08: 1000 mL

## 2014-04-08 MED ORDER — SODIUM CHLORIDE 0.9 % IJ SOLN: 3.0000 mL | INTRAMUSCULAR | Status: DC | PRN

## 2014-04-08 MED ORDER — ONDANSETRON HCL 4 MG/2ML IJ SOLN
INTRAMUSCULAR | Status: AC
  Filled 2014-04-08: qty 2

## 2014-04-08 MED ORDER — GLYCOPYRROLATE 0.2 MG/ML IJ SOLN
INTRAMUSCULAR | Status: AC
  Filled 2014-04-08: qty 3

## 2014-04-08 MED ORDER — NEOSTIGMINE METHYLSULFATE 1 MG/ML IJ SOLN
INTRAMUSCULAR | Status: AC
  Filled 2014-04-08: qty 10

## 2014-04-08 MED ORDER — LIDOCAINE HCL (CARDIAC) 20 MG/ML IV SOLN
INTRAVENOUS | Status: AC
  Filled 2014-04-08: qty 5

## 2014-04-08 MED ORDER — LACTATED RINGERS IR SOLN
Status: DC | PRN
  Administered 2014-04-08: 1000 mL

## 2014-04-08 MED ORDER — MIDAZOLAM HCL 5 MG/5ML IJ SOLN
INTRAMUSCULAR | Status: DC | PRN
  Administered 2014-04-08: 2 mg via INTRAVENOUS

## 2014-04-08 MED ORDER — ROCURONIUM BROMIDE 100 MG/10ML IV SOLN
INTRAVENOUS | Status: DC | PRN
  Administered 2014-04-08: 5 mg via INTRAVENOUS
  Administered 2014-04-08: 40 mg via INTRAVENOUS

## 2014-04-08 MED ORDER — FENTANYL CITRATE 0.05 MG/ML IJ SOLN
INTRAMUSCULAR | Status: AC
  Filled 2014-04-08: qty 5

## 2014-04-08 MED ORDER — GLYCOPYRROLATE 0.2 MG/ML IJ SOLN
INTRAMUSCULAR | Status: DC | PRN
  Administered 2014-04-08: 0.6 mg via INTRAVENOUS

## 2014-04-08 MED ORDER — DEXAMETHASONE SODIUM PHOSPHATE 10 MG/ML IJ SOLN
INTRAMUSCULAR | Status: DC | PRN
  Administered 2014-04-08: 10 mg via INTRAVENOUS

## 2014-04-08 MED ORDER — LACTATED RINGERS IV SOLN
INTRAVENOUS | Status: DC
  Administered 2014-04-08: 1000 mL via INTRAVENOUS

## 2014-04-08 MED ORDER — PROPOFOL 10 MG/ML IV BOLUS
INTRAVENOUS | Status: DC | PRN
  Administered 2014-04-08: 150 mg via INTRAVENOUS

## 2014-04-08 MED ORDER — LIDOCAINE HCL (CARDIAC) 20 MG/ML IV SOLN
INTRAVENOUS | Status: DC | PRN
  Administered 2014-04-08: 100 mg via INTRAVENOUS

## 2014-04-08 MED ORDER — SODIUM CHLORIDE 0.9 % IJ SOLN: 3.0000 mL | Freq: Two times a day (BID) | INTRAMUSCULAR | Status: DC

## 2014-04-08 MED ORDER — BUPIVACAINE-EPINEPHRINE PF 0.25-1:200000 % IJ SOLN
INTRAMUSCULAR | Status: AC
  Filled 2014-04-08: qty 30

## 2014-04-08 MED ORDER — NEOSTIGMINE METHYLSULFATE 1 MG/ML IJ SOLN
INTRAMUSCULAR | Status: DC | PRN
  Administered 2014-04-08: 5 mg via INTRAVENOUS

## 2014-04-08 MED ORDER — PHENYLEPHRINE 40 MCG/ML (10ML) SYRINGE FOR IV PUSH (FOR BLOOD PRESSURE SUPPORT)
PREFILLED_SYRINGE | INTRAVENOUS | Status: AC
  Filled 2014-04-08: qty 10

## 2014-04-08 MED ORDER — SODIUM CHLORIDE 0.9 % IJ SOLN
INTRAMUSCULAR | Status: AC
  Filled 2014-04-08: qty 10

## 2014-04-08 MED ORDER — CEFAZOLIN SODIUM-DEXTROSE 2-3 GM-% IV SOLR
INTRAVENOUS | Status: AC
  Filled 2014-04-08: qty 50

## 2014-04-08 MED ORDER — ONDANSETRON HCL 4 MG/2ML IJ SOLN
INTRAMUSCULAR | Status: DC | PRN
  Administered 2014-04-08: 4 mg via INTRAVENOUS

## 2014-04-08 MED ORDER — SODIUM CHLORIDE 0.9 % IV SOLN: 250.0000 mL | INTRAVENOUS | Status: DC | PRN

## 2014-04-08 MED ORDER — OXYCODONE HCL 5 MG PO TABS: 5.0000 mg | ORAL_TABLET | ORAL | Status: DC | PRN

## 2014-04-08 MED ORDER — MORPHINE SULFATE 10 MG/ML IJ SOLN: 1.0000 mg | INTRAMUSCULAR | Status: DC | PRN

## 2014-04-08 MED ORDER — BUPIVACAINE-EPINEPHRINE 0.25% -1:200000 IJ SOLN
INTRAMUSCULAR | Status: DC | PRN
  Administered 2014-04-08: 26 mL

## 2014-04-08 MED ORDER — OXYCODONE HCL 5 MG PO TABS
5.0000 mg | ORAL_TABLET | Freq: Once | ORAL | Status: AC | PRN
Start: 2014-04-08 — End: 2014-04-08
  Administered 2014-04-08: 5 mg via ORAL
  Filled 2014-04-08: qty 1

## 2014-04-08 MED ORDER — ACETAMINOPHEN 325 MG PO TABS: 650.0000 mg | ORAL_TABLET | ORAL | Status: DC | PRN

## 2014-04-08 MED ORDER — EPHEDRINE SULFATE 50 MG/ML IJ SOLN
INTRAMUSCULAR | Status: DC | PRN
  Administered 2014-04-08: 5 mg via INTRAVENOUS
  Administered 2014-04-08 (×2): 10 mg via INTRAVENOUS

## 2014-04-08 MED ORDER — HYDROMORPHONE HCL PF 1 MG/ML IJ SOLN: 0.2500 mg | INTRAMUSCULAR | Status: DC | PRN

## 2014-04-08 MED ORDER — OXYCODONE-ACETAMINOPHEN 5-325 MG PO TABS: 1.0000 | ORAL_TABLET | ORAL | Status: DC | PRN

## 2014-04-08 MED ORDER — EPHEDRINE SULFATE 50 MG/ML IJ SOLN
INTRAMUSCULAR | Status: AC
Start: 2014-04-08 — End: 2014-04-08
  Filled 2014-04-08: qty 1

## 2014-04-08 MED ORDER — PROMETHAZINE HCL 25 MG/ML IJ SOLN: 6.2500 mg | INTRAMUSCULAR | Status: DC | PRN

## 2014-04-08 MED ORDER — MIDAZOLAM HCL 2 MG/2ML IJ SOLN
INTRAMUSCULAR | Status: AC
  Filled 2014-04-08: qty 2

## 2014-04-08 MED ORDER — DEXAMETHASONE SODIUM PHOSPHATE 10 MG/ML IJ SOLN
INTRAMUSCULAR | Status: AC
  Filled 2014-04-08: qty 1

## 2014-04-08 MED ORDER — PHENYLEPHRINE HCL 10 MG/ML IJ SOLN
INTRAMUSCULAR | Status: DC | PRN
  Administered 2014-04-08 (×4): 80 ug via INTRAVENOUS

## 2014-04-08 MED ORDER — MEPERIDINE HCL 50 MG/ML IJ SOLN: 6.2500 mg | INTRAMUSCULAR | Status: DC | PRN

## 2014-04-08 MED ORDER — ROCURONIUM BROMIDE 100 MG/10ML IV SOLN
INTRAVENOUS | Status: AC
  Filled 2014-04-08: qty 1

## 2014-04-08 MED ORDER — KETOROLAC TROMETHAMINE 30 MG/ML IJ SOLN
INTRAMUSCULAR | Status: AC
  Filled 2014-04-08: qty 1

## 2014-04-08 MED ORDER — ACETAMINOPHEN 650 MG RE SUPP: 650.0000 mg | RECTAL | Status: DC | PRN

## 2014-04-08 MED ORDER — SUCCINYLCHOLINE CHLORIDE 20 MG/ML IJ SOLN
INTRAMUSCULAR | Status: DC | PRN
  Administered 2014-04-08: 100 mg via INTRAVENOUS

## 2014-04-08 MED ORDER — KETOROLAC TROMETHAMINE 30 MG/ML IJ SOLN
INTRAMUSCULAR | Status: DC | PRN
  Administered 2014-04-08: 30 mg via INTRAVENOUS

## 2014-04-08 MED ORDER — OXYCODONE HCL 5 MG/5ML PO SOLN
5.0000 mg | Freq: Once | ORAL | Status: AC | PRN
  Filled 2014-04-08: qty 5

## 2014-04-08 SURGICAL SUPPLY — 39 items
ADH SKN CLS APL DERMABOND .7 (GAUZE/BANDAGES/DRESSINGS) ×2
APL SKNCLS STERI-STRIP NONHPOA (GAUZE/BANDAGES/DRESSINGS)
BANDAGE ADH SHEER 1  50/CT (GAUZE/BANDAGES/DRESSINGS) IMPLANT
BENZOIN TINCTURE PRP APPL 2/3 (GAUZE/BANDAGES/DRESSINGS) IMPLANT
CANISTER SUCTION 2500CC (MISCELLANEOUS) ×4 IMPLANT
CLOSURE WOUND 1/2 X4 (GAUZE/BANDAGES/DRESSINGS)
DECANTER SPIKE VIAL GLASS SM (MISCELLANEOUS) IMPLANT
DERMABOND ADVANCED (GAUZE/BANDAGES/DRESSINGS) ×2
DERMABOND ADVANCED .7 DNX12 (GAUZE/BANDAGES/DRESSINGS) ×2 IMPLANT
DEVICE SECURE STRAP 25 ABSORB (INSTRUMENTS) IMPLANT
DRAPE LAPAROSCOPIC ABDOMINAL (DRAPES) ×4 IMPLANT
DRAPE UTILITY XL STRL (DRAPES) ×4 IMPLANT
DRSG TEGADERM 2-3/8X2-3/4 SM (GAUZE/BANDAGES/DRESSINGS) IMPLANT
DRSG TEGADERM 4X4.75 (GAUZE/BANDAGES/DRESSINGS) IMPLANT
ELECT REM PT RETURN 9FT ADLT (ELECTROSURGICAL) ×4
ELECTRODE REM PT RTRN 9FT ADLT (ELECTROSURGICAL) ×2 IMPLANT
GLOVE BIO SURGEON STRL SZ7.5 (GLOVE) IMPLANT
GLOVE BIOGEL M STRL SZ7.5 (GLOVE) ×4 IMPLANT
GLOVE INDICATOR 8.0 STRL GRN (GLOVE) ×4 IMPLANT
GOWN STRL REUS W/TWL LRG LVL3 (GOWN DISPOSABLE) ×4 IMPLANT
GOWN STRL REUS W/TWL XL LVL3 (GOWN DISPOSABLE) ×8 IMPLANT
KIT BASIN OR (CUSTOM PROCEDURE TRAY) ×4 IMPLANT
MESH ULTRAPRO 3X6 7.6X15CM (Mesh General) ×4 IMPLANT
NS IRRIG 1000ML POUR BTL (IV SOLUTION) ×4 IMPLANT
RELOAD STAPLE HERNIA 4.0 BLUE (INSTRUMENTS) ×8 IMPLANT
RELOAD STAPLE HERNIA 4.8 BLK (STAPLE) IMPLANT
SCISSORS LAP 5X35 DISP (ENDOMECHANICALS) IMPLANT
SET IRRIG TUBING LAPAROSCOPIC (IRRIGATION / IRRIGATOR) ×4 IMPLANT
SHEARS CURVED HARMONIC AC 45CM (MISCELLANEOUS) IMPLANT
SOLUTION ANTI FOG 6CC (MISCELLANEOUS) ×4 IMPLANT
STAPLER HERNIA 12 8.5 360D (INSTRUMENTS) ×4 IMPLANT
STRIP CLOSURE SKIN 1/2X4 (GAUZE/BANDAGES/DRESSINGS) IMPLANT
SUT MNCRL AB 4-0 PS2 18 (SUTURE) ×4 IMPLANT
SUT VICRYL 0 UR6 27IN ABS (SUTURE) ×4 IMPLANT
TOWEL OR 17X26 10 PK STRL BLUE (TOWEL DISPOSABLE) ×4 IMPLANT
TRAY LAP CHOLE (CUSTOM PROCEDURE TRAY) ×4 IMPLANT
TROCAR BLADELESS OPT 5 75 (ENDOMECHANICALS) ×8 IMPLANT
TROCAR XCEL BLUNT TIP 100MML (ENDOMECHANICALS) ×4 IMPLANT
TUBING INSUFFLATION 10FT LAP (TUBING) ×4 IMPLANT

## 2014-04-08 NOTE — Anesthesia Postprocedure Evaluation (Signed)
Anesthesia Post Note  Patient: Kristopher Martinez  Procedure(s) Performed: Procedure(s) (LRB): LAPAROSCOPIC RIGHT INGUINAL HERNIA REPAIR (Right) INSERTION OF MESH (Right)  Anesthesia type: General  Patient location: PACU  Post pain: Pain level controlled  Post assessment: Post-op Vital signs reviewed  Last Vitals: BP 109/72  Pulse 80  Temp(Src) 36.6 C (Oral)  Resp 16  Ht 5\' 10"  (1.778 m)  Wt 162 lb (73.483 kg)  BMI 23.24 kg/m2  SpO2 97%  Post vital signs: Reviewed  Level of consciousness: sedated  Complications: No apparent anesthesia complications

## 2014-04-08 NOTE — Transfer of Care (Signed)
Immediate Anesthesia Transfer of Care Note  Patient: Kristopher Martinez  Procedure(s) Performed: Procedure(s) (LRB): LAPAROSCOPIC RIGHT INGUINAL HERNIA REPAIR (Right) INSERTION OF MESH (Right)  Patient Location: PACU  Anesthesia Type: General  Level of Consciousness: sedated, patient cooperative and responds to stimulation  Airway & Oxygen Therapy: Patient Spontanous Breathing and Patient connected to face mask oxgen  Post-op Assessment: Report given to PACU RN and Post -op Vital signs reviewed and stable  Post vital signs: Reviewed and stable  Complications: No apparent anesthesia complications

## 2014-04-08 NOTE — Op Note (Signed)
04/08/2014  Kristopher Martinez [birth year] [id number]  PREOPERATIVE DIAGNOSIS: right inguinal hernia.   POSTOPERATIVE DIAGNOSIS: right direct inguinal hernia.   PROCEDURE: Laparoscopic repair of right direct inguinal hernia with  mesh (TAPP).   SURGEON: Leighton Ruff. Redmond Pulling, MD   ASSISTANT SURGEON: None.   ANESTHESIA: General plus local consisting of 0.25% Marcaine with epi.   ESTIMATED BLOOD LOSS: Minimal.   FINDINGS: The patient had a right defect inguinal hernia.  It was repaired using a 3 inch x 6  inch piece of Ethicon UltraPro mesh.   SPECIMEN: none  INDICATIONS FOR PROCEDURE: 68 yo WM with enlarging symptomatic right inguinal hernia who desired repair.  The risks and benefits including but not limited to bleeding, infection, chronic inguinal pain, nerve entrapment, hernia recurrence, mesh complications, hematoma formation, urinary retention, injury to the testicles or the ovaries, numbness in the groin, blood clots, injury to the surrounding structures, and anesthesia risk was discussed with the patient.  DESCRIPTION OF PROCEDURE: After obtaining verbal consent and marking  the right groin in the holding area with the patient confirming the  operative site, the patient was then taken back to the operating room, placed  supine on the operating room table. General endotracheal anesthesia was  established. The patient had emptied their bladder prior to going back to  the operating room. Sequential compression devices were placed. The  abdomen and groin were prepped and draped in the usual standard surgical  fashion with ChloraPrep. The patient received IV  antibiotics prior to the incision. A surgical time-out was performed.  Local was infiltrated at the base of the umbilicus.   Next, a 1-cm vertical infraumbilical incision was made with a #11 blade. The fascia  was grasped and lifted anteriorly. Next, the fascia was incised, and  the abdominal cavity was entered. Pursestring suture  was placed around  the fascial edges using a 0 Vicryl. A 12-mm Hasson trocar was placed.  Pneumoperitoneum was smoothly established up to a patient pressure of 15  mmHg. Laparoscope was advanced. There was no evidence of a  contralateral hernia. The patient had a defect medial to  the inferior epigastric vessel, consistent with an right direct  hernia. Two 5-mm trocars were placed, one on the right, one on the left  in the midclavicular line slightly above the level of the umbilicus all  under direct visualization. After local had been infiltrated, I then  made incision along the peritoneum on the right, starting 2 inches above  the anterior superior iliac spine and caring it medial  toward the median umbilical ligament in a lazy S configuration using  Endo Shears with electrocautery. The peritoneal flap was then gently  dissected downward from the anterior abdominal wall taking care not to  injure the inferior epigastric vessels. The pubic bone was identified.  The testicular vessels were identified.  Using  traction and counter traction with short graspers, I reduced the direct herniasac in  its entirety. The testicular vessels had been identified and preserved. The vas deferens was identified and preserved, and the hernia sac was stripped from those to  surrounding structures. I then went about creating a large pocket by  lifting the peritoneum of the pelvic floor. I took great care not to  injure the iliac vessels.    Local anesthetic was injected 2 finger breadths below and medial to the anterior superior iliac spine as well as along the right groin prior to placing the mesh. I then obtained a piece of Ethicon  UltraPro mesh 3 inch x  6 inch, placed it through the Hasson trocar, half of it covered medial  to the inferior epigastric vessels and half of it lateral to the  inferior epigastric vessels. The defect was well  covered with the mesh. I then secured the mesh to the abdominal wall   using an Covidien Universal Hernia Stapler (4.72mm staples). Staples were placed through  the Cooper's ligament, one staple on each side of the inferior epigastric  vessel and two staples out laterally. No staples were placed below the  shelving edge of the inguinal ligament. Pneumoperitoneum was reduced  to 8 mmHg. I then brought the peritoneal flap back up to the abdominal  wall and stapled it to the abdominal wall using 5 staples. There was a small  defect in the peritoneum and it was reapproximated with 1 staple thru the peritoneum only, and the mesh was well covered. I removed the  Hasson trocar and tied down the previously placed pursestring suture.  The closure was viewed laparoscopically. There was no evidence of  fascial defect. There was no air leak at the umbilicus. There was no  evidence of injury to surrounding structures. Pneumoperitoneum was  released, and the remaining trocars were removed. All skin incisions  were closed with a 4-0 Monocryl in a subcuticular fashion followed by  application of Dermabond. All needle, instrument, and sponge counts  were correct x2. There are no immediate complications. The patient  tolerated the procedure well. The patient was extubated and taken to the  recovery room in stable condition.  Leighton Ruff. Redmond Pulling, MD, FACS General, Bariatric, & Minimally Invasive Surgery [facility] [place]

## 2014-04-08 NOTE — H&P (View-Only) (Signed)
Patient ID: Kristopher Martinez, male   DOB: 04/08/1946, 67 y.o.   MRN: [id number]  Chief Complaint  Patient presents with  . Routine Post Op    HPI Kristopher Martinez is a 67 y.o. male.  HPI 67-year-old Caucasian male comes back in to rediscuss his right inguinal hernia. I initially met him in October 2014. Since that time he has done extensive research regarding inguinal hernia repair. He entertained going to [place] to getting his hernia repaired without mesh at the [facility]. However after communicating with him being male and his ongoing research he is here today to schedule laparoscopic inguinal hernia repair. He continues to have no pain or discomfort. However the bulge is more prominent at times. He denies any abdominal pain, nausea, vomiting, diarrhea or constipation. Past Medical History  Diagnosis Date  . IBS (irritable bowel syndrome)   . Hemorrhoid   . Low back pain   . Hyperlipidemia     Past Surgical History  Procedure Laterality Date  . Appendectomy    . Hyperplasia tissue excision      atypical lymphoid     Family History  Problem Relation Age of Onset  . Prostate cancer    . Colitis Mother   . Alcohol abuse Mother   . Cancer Father     prostate cancer    Social History History  Substance Use Topics  . Smoking status: Former Smoker    Quit date: 12/31/1976  . Smokeless tobacco: Never Used  . Alcohol Use: Yes     Comment: wine daily    No Known Allergies  Current Outpatient Prescriptions  Medication Sig Dispense Refill  . ibuprofen (ADVIL,MOTRIN) 100 MG tablet Take 100 mg by mouth every 6 (six) hours as needed.        . Melatonin 3 MG CAPS Take by mouth at bedtime as needed.        . tamsulosin (FLOMAX) 0.4 MG CAPS capsule Take 1 capsule (0.4 mg total) by mouth daily. Start taking 4 days before surgery including morning of surgery  7 capsule  0   No current facility-administered medications for this visit.    Review of Systems Review of Systems    Constitutional: Negative for fever, chills, appetite change and unexpected weight change.  HENT: Negative for congestion and trouble swallowing.   Eyes: Negative for visual disturbance.  Respiratory: Negative for chest tightness and shortness of breath.   Cardiovascular: Negative for chest pain and leg swelling.       No PND, no orthopnea, no DOE  Gastrointestinal:       See HPI  Genitourinary: Negative for dysuria and hematuria.  Musculoskeletal: Negative.   Skin: Negative for rash.  Neurological: Negative for seizures and speech difficulty.  Hematological: Does not bruise/bleed easily.  Psychiatric/Behavioral: Negative for behavioral problems and confusion.    Blood pressure 134/80, pulse 80, temperature 97.8 F (36.6 C), temperature source Oral, resp. rate 16, height 5' 10" (1.778 m), weight 161 lb (73.029 kg).  Physical Exam Physical Exam  Constitutional: He is oriented to person, place, and time. He appears well-developed and well-nourished. No distress.  HENT:  Head: Normocephalic and atraumatic.  Right Ear: External ear normal.  Left Ear: External ear normal.  Eyes: Conjunctivae are normal. No scleral icterus.  Neck: Normal range of motion. Neck supple. No tracheal deviation present. No thyromegaly present.  Cardiovascular: Normal rate, normal heart sounds and intact distal pulses.   Pulmonary/Chest: Effort normal and breath sounds normal. No   respiratory distress. He has no wheezes.  Abdominal: Soft. He exhibits no distension. There is no tenderness. There is no rebound and no guarding. A hernia is present. Hernia confirmed positive in the right inguinal area. Hernia confirmed negative in the left inguinal area.    Genitourinary: Testes normal and penis normal.  Musculoskeletal: Normal range of motion. He exhibits no edema and no tenderness.  Lymphadenopathy:    He has no cervical adenopathy.  Neurological: He is alert and oriented to person, place, and time. He exhibits  normal muscle tone.  Skin: Skin is warm and dry. No rash noted. He is not diaphoretic. No erythema. No pallor.  Psychiatric: He has a normal mood and affect. His behavior is normal. Judgment and thought content normal.    Data Reviewed My office note and emails with pt  Assessment    Right inguinal hernia     Plan    We re discussed the etiology of inguinal hernias. We discussed the signs & symptoms of incarceration & strangulation.  We discussed non-operative and operative management.  The patient has elected to proceed with LAPAROSCOPIC REPAIR OF RIGHT INGUINAL HERNIA WITH MESH   I re-described the procedure in detail.  The patient was given educational material. We discussed the risks and benefits including but not limited to bleeding, infection, chronic inguinal pain, nerve entrapment, hernia recurrence, mesh complications, hematoma formation, urinary retention, injury to the testicles, numbness in the groin, blood clots, injury to the surrounding structures, and anesthesia risk. We also discussed the typical post operative recovery course, including no heavy lifting for 4-6 weeks. I explained that the likelihood of improvement of their symptoms is good  He will meet with the schedulers today to schedule surgery. He was given a prescription for perioperative Flomax to decrease his risk of postoperative urinary retention.  Aanshi Batchelder M. Andric Kerce, MD, FACS General, Bariatric, & Minimally Invasive Surgery [facility]          Quanna Wittke M 03/11/2014, 1:30 PM    

## 2014-04-08 NOTE — Progress Notes (Signed)
Report given to Pam

## 2014-04-08 NOTE — Interval H&P Note (Signed)
History and Physical Interval Note:  04/08/2014 11:56 AM  Kristopher Martinez  has presented today for surgery, with the diagnosis of right inguinal hernia  The various methods of treatment have been discussed with the patient and family. After consideration of risks, benefits and other options for treatment, the patient has consented to  Procedure(s): LAPAROSCOPIC RIGHT INGUINAL HERNIA REPAIR (Right) INSERTION OF MESH (Right) as a surgical intervention .  The patient's history has been reviewed, patient examined, no change in status, stable for surgery.  I have reviewed the patient's chart and labs.  Questions were answered to the patient's satisfaction.    Leighton Ruff. Redmond Pulling, MD, [place], Bariatric, & Minimally Invasive Surgery [facility], [place]   Gayland Curry

## 2014-04-08 NOTE — Discharge Instructions (Signed)
[facility] ° °UMBILICAL OR INGUINAL HERNIA REPAIR: POST OP INSTRUCTIONS ° °Always review your discharge instruction sheet given to you by the facility where your surgery was performed. °IF YOU HAVE DISABILITY OR FAMILY LEAVE FORMS, YOU MUST BRING THEM TO THE OFFICE FOR PROCESSING.   °DO NOT GIVE THEM TO YOUR DOCTOR. ° °1. A  prescription for pain medication may be given to you upon discharge.  Take your pain medication as prescribed, if needed.  If narcotic pain medicine is not needed, then you may take acetaminophen (Tylenol) or ibuprofen (Advil) as needed. °2. Take your usually prescribed medications unless otherwise directed. °3. If you need a refill on your pain medication, please contact your pharmacy.  They will contact our office to request authorization. Prescriptions will not be filled after 5 pm or on week-ends. °4. You should follow a light diet the first 24 hours after arrival home, such as soup and crackers, etc.  Be sure to include lots of fluids daily.  Resume your normal diet the day after surgery. °5. Most patients will experience some swelling and bruising around the umbilicus or in the groin and scrotum.  Ice packs and reclining will help.  Swelling and bruising can take several days to resolve.  °6. It is common to experience some constipation if taking pain medication after surgery.  Increasing fluid intake and taking a stool softener (such as Colace) will usually help or prevent this problem from occurring.  A mild laxative (Milk of Magnesia or Miralax) should be taken according to package directions if there are no bowel movements after 48 hours. °7.   If your surgeon used skin glue on the incision, you may shower in 24 hours.  The glue will flake off over the next 2-3 weeks.  Any sutures or staples will be removed at the office during your follow-up visit. °8. ACTIVITIES:  You may resume regular (light) daily activities beginning the next day--such as daily self-care,  walking, climbing stairs--gradually increasing activities as tolerated.  You may have sexual intercourse when it is comfortable.  Refrain from any heavy lifting or straining until approved by your doctor. °a. You may drive when you are no longer taking prescription pain medication, you can comfortably wear a seatbelt, and you can safely maneuver your car and apply brakes. °b. RETURN TO WORK:  °9. You should see your doctor in the office for a follow-up appointment approximately 2-3 weeks after your surgery.  Make sure that you call for this appointment within a day or two after you arrive home to insure a convenient appointment time. °10. OTHER INSTRUCTIONS:   ° ° °WHEN TO CALL YOUR DOCTOR: °1. Fever over 101.0 °2. Inability to urinate °3. Nausea and/or vomiting °4. Extreme swelling or bruising °5. Continued bleeding from incision. °6. Increased pain, redness, or drainage from the incision ° °The clinic staff is available to answer your questions during regular business hours.  Please don’t hesitate to call and ask to speak to one of the nurses for clinical concerns.  If you have a medical emergency, go to the nearest emergency room or call 911.  A surgeon from [facility] is always on call at the hospital ° ° °[address], [address], [place], [place]  [address] ? ° [address] [address], [place], [place]   [address] °[phone] ? [phone] ? FAX [phone] °Web site: [email or web address] ° °

## 2014-04-08 NOTE — Anesthesia Preprocedure Evaluation (Addendum)
Anesthesia Evaluation  Patient identified by MRN, date of birth, ID band Patient awake    Reviewed: Allergy & Precautions, H&P , NPO status , Patient's Chart, lab work & pertinent test results  Airway Mallampati: II TM Distance: >3 FB Neck ROM: Full    Dental  (+) Dental Advisory Given   Pulmonary former smoker,  breath sounds clear to auscultation        Cardiovascular negative cardio ROS  Rhythm:Regular Rate:Normal     Neuro/Psych negative neurological ROS  negative psych ROS   GI/Hepatic negative GI ROS, Neg liver ROS,   Endo/Other  negative endocrine ROS  Renal/GU negative Renal ROS     Musculoskeletal negative musculoskeletal ROS (+)   Abdominal   Peds  Hematology negative hematology ROS (+)   Anesthesia Other Findings   Reproductive/Obstetrics                          Anesthesia Physical Anesthesia Plan  ASA: II  Anesthesia Plan: General   Post-op Pain Management:    Induction: Intravenous  Airway Management Planned: Oral ETT  Additional Equipment:   Intra-op Plan:   Post-operative Plan: Extubation in OR  Informed Consent: I have reviewed the patients History and Physical, chart, labs and discussed the procedure including the risks, benefits and alternatives for the proposed anesthesia with the patient or authorized representative who has indicated his/her understanding and acceptance.   Dental advisory given  Plan Discussed with: CRNA  Anesthesia Plan Comments:         Anesthesia Quick Evaluation

## 2014-04-09 ENCOUNTER — Encounter (HOSPITAL_COMMUNITY): Payer: Self-pay | Admitting: General Surgery

## 2014-04-09 ENCOUNTER — Encounter (INDEPENDENT_AMBULATORY_CARE_PROVIDER_SITE_OTHER): Payer: Medicare Other | Admitting: General Surgery

## 2014-04-09 ENCOUNTER — Telehealth (INDEPENDENT_AMBULATORY_CARE_PROVIDER_SITE_OTHER): Payer: Self-pay | Admitting: General Surgery

## 2014-04-09 NOTE — Telephone Encounter (Signed)
Spoke with pt and informed him I scheduled his PO appt on 04/22/14 at 2:30 w/ an arrival time of 2:15.

## 2014-04-09 NOTE — Telephone Encounter (Signed)
Message copied by Flossie Buffy on Fri Apr 09, 2014  3:09 PM ------      Message from: Salvatore Marvel      Created: Fri Apr 09, 2014  2:13 PM      Regarding: Dr. Redmond Pulling 1st po Lap RIH      Contact: [phone]       Patient had lap RIH on 04/08/14 With Dr. Redmond Pulling and needs 1st po appointment.            Thank you. ------

## 2014-04-22 ENCOUNTER — Encounter (INDEPENDENT_AMBULATORY_CARE_PROVIDER_SITE_OTHER): Payer: Self-pay | Admitting: General Surgery

## 2014-04-22 ENCOUNTER — Ambulatory Visit (INDEPENDENT_AMBULATORY_CARE_PROVIDER_SITE_OTHER): Payer: Medicare Other | Admitting: General Surgery

## 2014-04-22 VITALS — BP 122/80 | Temp 98.0°F | Resp 14 | Ht 70.0 in | Wt 156.4 lb

## 2014-04-22 DIAGNOSIS — Z09 Encounter for follow-up examination after completed treatment for conditions other than malignant neoplasm: Secondary | ICD-10-CM

## 2014-04-22 NOTE — Progress Notes (Signed)
Subjective:     Patient ID: Kristopher Martinez, male   DOB: 1946/03/15, 68 y.o.   MRN: [id number]  HPI  68 year old Caucasian male comes in today for followup after going laparoscopic repair of a right direct inguinal hernia with mesh on April 9. He states he is doing great. He did have a few days of constipation but is now regular. He states he did have some pain the first 3 days however he has minimal to no discomfort now. He denies any burning or stinging sensation in his groin. He denies any difficulty urinating. He is very motivated to resume working out his abs  Review of Systems     Objective:   Physical Exam BP 122/80  Temp(Src) 98 F (36.7 C)  Resp 14  Ht 5\' 10"  (1.778 m)  Wt 156 lb 6.4 oz (70.943 kg)  BMI 22.44 kg/m2  Gen: alert, NAD, non-toxic appearing Pulm: Lungs clear to auscultation, symmetric chest rise CV: regular rate and rhythm Abd: soft, nontender, nondistended. Well-healed trocar sites. No cellulitis. No incisional hernia GU: both testicles descended, no scrotal masses, no sign of hernia recurrence Ext: no edema     Assessment:     Status post laparoscopic repair of right direct inguinal hernia with mesh     Plan:     Overall I think he is doing fantastic. We discussed the intraoperative findings and we discussed the intraoperative photographs. I reminded him he should not do any heavy lifting for another 2 weeks. We discussed what cardio activity he can do currently. Since he is doing so well I will see him on an as-needed basis  Leighton Ruff. Redmond Pulling, MD, FACS General, Bariatric, & Minimally Invasive Surgery [facility], [place]

## 2014-04-22 NOTE — Patient Instructions (Signed)
Avoid heavy lifting for another 2 weeks Can do light cardio now

## 2014-04-30 ENCOUNTER — Encounter (HOSPITAL_COMMUNITY): Payer: Self-pay | Admitting: Emergency Medicine

## 2014-04-30 ENCOUNTER — Emergency Department (HOSPITAL_COMMUNITY)
Admission: EM | Admit: 2014-04-30 | Discharge: 2014-04-30 | Disposition: A | Discharge status: Home / Self Care | Payer: Medicare Other | Attending: Emergency Medicine | Admitting: Emergency Medicine

## 2014-04-30 ENCOUNTER — Emergency Department (HOSPITAL_COMMUNITY): Payer: Medicare Other

## 2014-04-30 DIAGNOSIS — Z87891 Personal history of nicotine dependence: Secondary | ICD-10-CM | POA: Insufficient documentation

## 2014-04-30 DIAGNOSIS — Z8719 Personal history of other diseases of the digestive system: Secondary | ICD-10-CM | POA: Insufficient documentation

## 2014-04-30 DIAGNOSIS — Z8709 Personal history of other diseases of the respiratory system: Secondary | ICD-10-CM | POA: Insufficient documentation

## 2014-04-30 DIAGNOSIS — G8918 Other acute postprocedural pain: Secondary | ICD-10-CM | POA: Insufficient documentation

## 2014-04-30 DIAGNOSIS — Z8679 Personal history of other diseases of the circulatory system: Secondary | ICD-10-CM | POA: Insufficient documentation

## 2014-04-30 DIAGNOSIS — Z862 Personal history of diseases of the blood and blood-forming organs and certain disorders involving the immune mechanism: Secondary | ICD-10-CM | POA: Insufficient documentation

## 2014-04-30 DIAGNOSIS — Z79899 Other long term (current) drug therapy: Secondary | ICD-10-CM | POA: Insufficient documentation

## 2014-04-30 DIAGNOSIS — Z8639 Personal history of other endocrine, nutritional and metabolic disease: Secondary | ICD-10-CM | POA: Insufficient documentation

## 2014-04-30 DIAGNOSIS — Z8669 Personal history of other diseases of the nervous system and sense organs: Secondary | ICD-10-CM | POA: Insufficient documentation

## 2014-04-30 DIAGNOSIS — R109 Unspecified abdominal pain: Secondary | ICD-10-CM | POA: Insufficient documentation

## 2014-04-30 DIAGNOSIS — Z9089 Acquired absence of other organs: Secondary | ICD-10-CM | POA: Insufficient documentation

## 2014-04-30 LAB — URINALYSIS, ROUTINE W REFLEX MICROSCOPIC
Bilirubin Urine: NEGATIVE
Glucose, UA: NEGATIVE mg/dL
Hgb urine dipstick: NEGATIVE
Ketones, ur: 15 mg/dL — AB
Leukocytes, UA: NEGATIVE
NITRITE: NEGATIVE
Protein, ur: NEGATIVE mg/dL
SPECIFIC GRAVITY, URINE: 1.013 (ref 1.005–1.030)
UROBILINOGEN UA: 1 mg/dL (ref 0.0–1.0)
pH: 7 (ref 5.0–8.0)

## 2014-04-30 LAB — CBC WITH DIFFERENTIAL/PLATELET
Basophils Absolute: 0 10*3/uL (ref 0.0–0.1)
Basophils Relative: 0 % (ref 0–1)
EOS ABS: 0.1 10*3/uL (ref 0.0–0.7)
EOS PCT: 1 % (ref 0–5)
HCT: 41.3 % (ref 39.0–52.0)
HEMOGLOBIN: 14.5 g/dL (ref 13.0–17.0)
LYMPHS ABS: 1.5 10*3/uL (ref 0.7–4.0)
Lymphocytes Relative: 24 % (ref 12–46)
MCH: 32.1 pg (ref 26.0–34.0)
MCHC: 35.1 g/dL (ref 30.0–36.0)
MCV: 91.4 fL (ref 78.0–100.0)
MONOS PCT: 12 % (ref 3–12)
Monocytes Absolute: 0.8 10*3/uL (ref 0.1–1.0)
Neutro Abs: 3.8 10*3/uL (ref 1.7–7.7)
Neutrophils Relative %: 62 % (ref 43–77)
Platelets: 203 10*3/uL (ref 150–400)
RBC: 4.52 MIL/uL (ref 4.22–5.81)
RDW: 13.7 % (ref 11.5–15.5)
WBC: 6.1 10*3/uL (ref 4.0–10.5)

## 2014-04-30 LAB — COMPREHENSIVE METABOLIC PANEL
ALK PHOS: 183 U/L — AB (ref 39–117)
ALT: 194 U/L — ABNORMAL HIGH (ref 0–53)
AST: 72 U/L — ABNORMAL HIGH (ref 0–37)
Albumin: 3.6 g/dL (ref 3.5–5.2)
BUN: 22 mg/dL (ref 6–23)
CALCIUM: 9 mg/dL (ref 8.4–10.5)
CO2: 23 mEq/L (ref 19–32)
Chloride: 104 mEq/L (ref 96–112)
Creatinine, Ser: 0.8 mg/dL (ref 0.50–1.35)
GFR calc non Af Amer: >90 mL/min (ref >90)
GLUCOSE: 104 mg/dL — AB (ref 70–99)
Potassium: 3.9 mEq/L (ref 3.7–5.3)
Sodium: 142 mEq/L (ref 137–147)
TOTAL PROTEIN: 6.5 g/dL (ref 6.0–8.3)
Total Bilirubin: 0.7 mg/dL (ref 0.3–1.2)

## 2014-04-30 LAB — LIPASE, BLOOD: Lipase: 50 U/L (ref 11–59)

## 2014-04-30 MED ORDER — MORPHINE SULFATE 4 MG/ML IJ SOLN
4.0000 mg | Freq: Once | INTRAMUSCULAR | Status: DC
  Filled 2014-04-30: qty 1

## 2014-04-30 MED ORDER — SODIUM CHLORIDE 0.9 % IV BOLUS (SEPSIS)
1000.0000 mL | Freq: Once | INTRAVENOUS | Status: AC
  Administered 2014-04-30: 1000 mL via INTRAVENOUS

## 2014-04-30 MED ORDER — ONDANSETRON HCL 4 MG/2ML IJ SOLN
4.0000 mg | Freq: Once | INTRAMUSCULAR | Status: AC
  Administered 2014-04-30: 4 mg via INTRAVENOUS
  Filled 2014-04-30: qty 2

## 2014-04-30 NOTE — Discharge Instructions (Signed)
Abdominal Pain, Adult °Many things can cause abdominal pain. Usually, abdominal pain is not caused by a disease and will improve without treatment. It can often be observed and treated at home. Your health care provider will do a physical exam and possibly order blood tests and X-rays to help determine the seriousness of your pain. However, in many cases, more time must pass before a clear cause of the pain can be found. Before that point, your health care provider may not know if you need more testing or further treatment. °HOME CARE INSTRUCTIONS  °Monitor your abdominal pain for any changes. The following actions may help to alleviate any discomfort you are experiencing: °· Only take over-the-counter or prescription medicines as directed by your health care provider. °· Do not take laxatives unless directed to do so by your health care provider. °· Try a clear liquid diet (broth, tea, or water) as directed by your health care provider. Slowly move to a bland diet as tolerated. °SEEK MEDICAL CARE IF: °· You have unexplained abdominal pain. °· You have abdominal pain associated with nausea or diarrhea. °· You have pain when you urinate or have a bowel movement. °· You experience abdominal pain that wakes you in the night. °· You have abdominal pain that is worsened or improved by eating food. °· You have abdominal pain that is worsened with eating fatty foods. °SEEK IMMEDIATE MEDICAL CARE IF:  °· Your pain does not go away within 2 hours. °· You have a fever. °· You keep throwing up (vomiting). °· Your pain is felt only in portions of the abdomen, such as the right side or the left lower portion of the abdomen. °· You pass bloody or black tarry stools. °MAKE SURE YOU: °· Understand these instructions.   °· Will watch your condition.   °· Will get help right away if you are not doing well or get worse.   °Document Released: 09/26/2005 Document Revised: 10/07/2013 Document Reviewed: 08/26/2013 °ExitCare® Patient  Information ©2014 [employer]. ° °

## 2014-04-30 NOTE — ED Notes (Signed)
Pt states that he was awakened by diffuse mid abdominal that began around 2:30am; pt states that the pain is cramping that waves across mid abd; denies nausea, vomiting or diarrhea; pt states that he has had recent hernia repair.

## 2014-04-30 NOTE — ED Provider Notes (Signed)
CSN: [id number]     Arrival date & time 04/30/14  0340 History   First MD Initiated Contact with Patient 04/30/14 [phone]     Chief Complaint  Patient presents with  . Abdominal Pain     (Consider location/radiation/quality/duration/timing/severity/associated sxs/prior Treatment) HPI Comments: Patient is a 68 year old male with no significant past medical history who underwent a right inguinal hernia repair 3 weeks ago and presents with mid abdominal pain. The pain woke him from sleep about 1.5 hours ago. The pain does not radiate. The pain is described as cramping and severe. The pain started suddenly and has acute phases of worsening since the onset. No alleviating/aggravating factors. The patient has tried nothing for symptoms without relief. Associated symptoms include nothing. Patient denies fever, headache, NVD, chest pain, SOB, dysuria, constipation. Patient reports feeling like he is having trouble passing gas. His last bowel movement was yesterday.   Patient is a 67 y.o. male presenting with abdominal pain.  Abdominal Pain Associated symptoms: no chest pain, no chills, no diarrhea, no dysuria, no fatigue, no fever, no nausea, no shortness of breath and no vomiting     Past Medical History  Diagnosis Date  . IBS (irritable bowel syndrome)   . Hemorrhoid   . Low back pain   . Hyperlipidemia   . Syncope 1995    did all testing-post-prandal-pt.states was told had long QT segment  . Flu     03/23/14    . Sinusitis     03/23/14   Past Surgical History  Procedure Laterality Date  . Appendectomy    . Hyperplasia tissue excision      atypical lymphoid   . Eye surgery      strabismus as child  . Tonsillectomy    . Inguinal hernia repair Right 04/08/2014    Procedure: LAPAROSCOPIC RIGHT INGUINAL HERNIA REPAIR;  Surgeon: Gayland Curry, MD;  Location: [facility];  Service: General;  Laterality: Right;  . Insertion of mesh Right 04/08/2014    Procedure: INSERTION OF MESH;  Surgeon: Gayland Curry, MD;  Location: [facility];  Service: General;  Laterality: Right;   Family History  Problem Relation Age of Onset  . Prostate cancer    . Colitis Mother   . Alcohol abuse Mother   . Cancer Father     prostate cancer   History  Substance Use Topics  . Smoking status: Former Smoker    Quit date: 12/31/1976  . Smokeless tobacco: Never Used  . Alcohol Use: Yes     Comment: wine daily    Review of Systems  Constitutional: Negative for fever, chills and fatigue.  HENT: Negative for trouble swallowing.   Eyes: Negative for visual disturbance.  Respiratory: Negative for shortness of breath.   Cardiovascular: Negative for chest pain and palpitations.  Gastrointestinal: Positive for abdominal pain. Negative for nausea, vomiting and diarrhea.  Genitourinary: Negative for dysuria and difficulty urinating.  Musculoskeletal: Negative for arthralgias and neck pain.  Skin: Negative for color change.  Neurological: Negative for dizziness and weakness.  Psychiatric/Behavioral: Negative for dysphoric mood.      Allergies  Review of patient's allergies indicates no known allergies.  Home Medications   Prior to Admission medications   Medication Sig Start Date End Date Taking? Authorizing Provider  Ibuprofen-Diphenhydramine Cit (ADVIL PM PO) Take 1 tablet by mouth at bedtime as needed (pain/sleep).   Yes Historical Provider, MD  Melatonin 3 MG CAPS Take 3 mg by mouth at bedtime as  needed. For sleep   Yes Historical Provider, MD  pseudoephedrine (SUDAFED) 30 MG tablet Take 30 mg by mouth every 4 (four) hours as needed for congestion. As needed per patient   Yes Historical Provider, MD   BP 116/77  Pulse 79  Temp(Src) 97.6 F (36.4 C) (Oral)  Resp 16  SpO2 100% Physical Exam  Nursing note and vitals reviewed. Constitutional: He appears well-developed and well-nourished. No distress.  HENT:  Head: Normocephalic and atraumatic.  Eyes: Conjunctivae and EOM are normal.  Neck:  Normal range of motion.  Cardiovascular: Normal rate and regular rhythm.  Exam reveals no gallop and no friction rub.   No murmur heard. Pulmonary/Chest: Effort normal and breath sounds normal. He has no wheezes. He has no rales. He exhibits no tenderness.  Abdominal: Soft. He exhibits no distension. There is tenderness. There is no rebound and no guarding.  Upper abdominal tenderness to palpation. No focal tenderness or peritoneal signs.   Musculoskeletal: Normal range of motion.  Neurological: He is alert.  Speech is goal-oriented. Moves limbs without ataxia.   Skin: Skin is warm and dry.  Psychiatric: He has a normal mood and affect. His behavior is normal.    ED Course  Procedures (including critical care time) Labs Review Labs Reviewed  COMPREHENSIVE METABOLIC PANEL - Abnormal; Notable for the following:    Glucose, Bld 104 (*)    AST 72 (*)    ALT 194 (*)    Alkaline Phosphatase 183 (*)    All other components within normal limits  URINALYSIS, ROUTINE W REFLEX MICROSCOPIC - Abnormal; Notable for the following:    Ketones, ur 15 (*)    All other components within normal limits  CBC WITH DIFFERENTIAL  LIPASE, BLOOD    Imaging Review Dg Abd 2 Views  04/30/2014   CLINICAL DATA:  Mid abdominal pain.  EXAM: ABDOMEN - 2 VIEW  COMPARISON:  Lumbar spine radiographs performed 04/19/2010  FINDINGS: The visualized bowel gas pattern is unremarkable. Scattered air and stool filled loops of colon are seen; no abnormal dilatation of small bowel loops is seen to suggest small bowel obstruction. No free intra-abdominal air is identified on the provided upright view. Postoperative change is noted overlying the right lower quadrant and right hemipelvis.  The visualized osseous structures are within normal limits; the sacroiliac joints are unremarkable in appearance. The visualized lung bases are essentially clear.  IMPRESSION: 1. Unremarkable bowel gas pattern; no free intra-abdominal air seen. 2.  Moderate amount of stool noted in the colon.   Electronically Signed   By: Garald Balding M.D.   On: 04/30/2014 05:49     EKG Interpretation None      MDM   Final diagnoses:  Abdominal pain    4:15 AM Labs and abdominal plain films pending. Vitals stable and patient afebrile. Patient will have morphine and zofran for symptoms.   5:29 AM Patient's AST, ALT and alk phos are elevated. Patient refused pain medication and is no longer having pain. Patient will have RUQ [place] to evaluate gallbladder.   Patient would like to go home. Patient will return for outpatient [place] of his gallbladder.   Alvina Chou, PA-C 04/30/14 2251

## 2014-05-01 NOTE — ED Provider Notes (Signed)
Medical screening examination/treatment/procedure(s) were conducted as a shared visit with non-physician practitioner(s) and myself.  I personally evaluated the patient during the encounter.   EKG Interpretation None      Patient developed resolution of all the symptoms in the emergency department.  Mild elevation in his liver enzymes.  Suspect recently passed common bile duct stone versus biliary colic.  I didn't to get an ultrasound of his abdomen but is concerned about the potential delay in the ultrasound tech coming to perform the exam and would prefer to have this done as an outpatient.  I scheduled him an outpatient ultrasound and left message for his primary care physician regarding his laboratory values which will need to be rechecked next week as well as the scheduled ultrasound.  His primary care physician stated that he would followup with this.  Patient understands return to the ER for new or worsening symptoms.  Hoy Morn, MD 05/01/14 [phone]

## 2014-05-03 ENCOUNTER — Ambulatory Visit (HOSPITAL_COMMUNITY)
Admission: RE | Admit: 2014-05-03 | Discharge: 2014-05-03 | Disposition: A | Discharge status: Home / Self Care | Payer: Medicare Other | Source: Ambulatory Visit | Attending: Emergency Medicine | Admitting: Emergency Medicine

## 2014-05-03 ENCOUNTER — Encounter: Payer: Self-pay | Admitting: Internal Medicine

## 2014-05-03 ENCOUNTER — Telehealth: Payer: Self-pay

## 2014-05-03 DIAGNOSIS — R748 Abnormal levels of other serum enzymes: Secondary | ICD-10-CM | POA: Insufficient documentation

## 2014-05-03 DIAGNOSIS — K824 Cholesterolosis of gallbladder: Secondary | ICD-10-CM | POA: Insufficient documentation

## 2014-05-03 DIAGNOSIS — N281 Cyst of kidney, acquired: Secondary | ICD-10-CM | POA: Insufficient documentation

## 2014-05-03 DIAGNOSIS — R109 Unspecified abdominal pain: Secondary | ICD-10-CM | POA: Insufficient documentation

## 2014-05-03 NOTE — Telephone Encounter (Signed)
ok 

## 2014-05-03 NOTE — Telephone Encounter (Signed)
Pt would like to transfer care to Dr. Raliegh Ip.  Pls advise.

## 2014-05-04 NOTE — Telephone Encounter (Signed)
Sent mychart message to pt making aware.  Pls schedule.

## 2014-05-04 NOTE — Telephone Encounter (Signed)
lmovm for pt to cb  °

## 2014-05-05 NOTE — Telephone Encounter (Signed)
Pt has been sch with dr Burnice Logan

## 2014-05-10 ENCOUNTER — Encounter: Payer: Self-pay | Admitting: Internal Medicine

## 2014-06-07 ENCOUNTER — Encounter: Payer: Self-pay | Admitting: Internal Medicine

## 2014-06-07 MED ORDER — TADALAFIL 20 MG PO TABS: 10.0000 mg | ORAL_TABLET | ORAL | Status: DC | PRN

## 2014-06-08 ENCOUNTER — Encounter: Payer: Self-pay | Admitting: Internal Medicine

## 2014-07-15 ENCOUNTER — Ambulatory Visit (INDEPENDENT_AMBULATORY_CARE_PROVIDER_SITE_OTHER): Payer: Medicare Other | Admitting: Internal Medicine

## 2014-07-15 ENCOUNTER — Encounter: Payer: Self-pay | Admitting: Internal Medicine

## 2014-07-15 VITALS — BP 100/64 | HR 64 | Temp 97.8°F | Resp 20 | Ht 69.5 in | Wt 157.0 lb

## 2014-07-15 DIAGNOSIS — Z23 Encounter for immunization: Secondary | ICD-10-CM

## 2014-07-15 DIAGNOSIS — Z8042 Family history of malignant neoplasm of prostate: Secondary | ICD-10-CM

## 2014-07-15 DIAGNOSIS — E785 Hyperlipidemia, unspecified: Secondary | ICD-10-CM

## 2014-07-15 DIAGNOSIS — Z0000 Encounter for general adult medical examination without abnormal findings: Secondary | ICD-10-CM

## 2014-07-15 DIAGNOSIS — M545 Low back pain, unspecified: Secondary | ICD-10-CM

## 2014-07-15 DIAGNOSIS — Z125 Encounter for screening for malignant neoplasm of prostate: Secondary | ICD-10-CM

## 2014-07-15 DIAGNOSIS — R945 Abnormal results of liver function studies: Secondary | ICD-10-CM

## 2014-07-15 LAB — COMPREHENSIVE METABOLIC PANEL
ALK PHOS: 49 U/L (ref 39–117)
ALT: 35 U/L (ref 0–53)
AST: 30 U/L (ref 0–37)
Albumin: 4.4 g/dL (ref 3.5–5.2)
BUN: 13 mg/dL (ref 6–23)
CO2: 25 mEq/L (ref 19–32)
Calcium: 9.2 mg/dL (ref 8.4–10.5)
Chloride: 104 mEq/L (ref 96–112)
Creatinine, Ser: 0.8 mg/dL (ref 0.4–1.5)
GFR: 105.23 mL/min (ref >60.00)
GLUCOSE: 93 mg/dL (ref 70–99)
POTASSIUM: 4.2 meq/L (ref 3.5–5.1)
SODIUM: 139 meq/L (ref 135–145)
TOTAL PROTEIN: 7 g/dL (ref 6.0–8.3)
Total Bilirubin: 0.7 mg/dL (ref 0.2–1.2)

## 2014-07-15 LAB — LIPID PANEL
CHOLESTEROL: 221 mg/dL — AB (ref 0–200)
HDL: 96.6 mg/dL (ref >39.00)
LDL CALC: 115 mg/dL — AB (ref 0–99)
NONHDL: 124.4
Total CHOL/HDL Ratio: 2
Triglycerides: 45 mg/dL (ref 0.0–149.0)
VLDL: 9 mg/dL (ref 0.0–40.0)

## 2014-07-15 LAB — TSH: TSH: 1.46 u[IU]/mL (ref 0.35–4.50)

## 2014-07-15 LAB — PSA: PSA: 2.3 ng/mL (ref 0.10–4.00)

## 2014-07-15 NOTE — Patient Instructions (Addendum)
It is important that you exercise regularly, at least 20 minutes 3 to 4 times per week.  If you develop chest pain or shortness of breath seek  medical attention.  Return in one year for follow-up  Health Maintenance A healthy lifestyle and preventative care can promote health and wellness.  Maintain regular health, dental, and eye exams.  Eat a healthy diet. Foods like vegetables, fruits, whole grains, low-fat dairy products, and lean protein foods contain the nutrients you need and are low in calories. Decrease your intake of foods high in solid fats, added sugars, and salt. Get information about a proper diet from your health care provider, if necessary.  Regular physical exercise is one of the most important things you can do for your health. Most adults should get at least 150 minutes of moderate-intensity exercise (any activity that increases your heart rate and causes you to sweat) each week. In addition, most adults need muscle-strengthening exercises on 2 or more days a week.   Maintain a healthy weight. The body mass index (BMI) is a screening tool to identify possible weight problems. It provides an estimate of body fat based on height and weight. Your health care provider can find your BMI and can help you achieve or maintain a healthy weight. For males 20 years and older:  A BMI below 18.5 is considered underweight.  A BMI of 18.5 to 24.9 is normal.  A BMI of 25 to 29.9 is considered overweight.  A BMI of 30 and above is considered obese.  Maintain normal blood lipids and cholesterol by exercising and minimizing your intake of saturated fat. Eat a balanced diet with plenty of fruits and vegetables. Blood tests for lipids and cholesterol should begin at age 20 and be repeated every 5 years. If your lipid or cholesterol levels are high, you are over age 50, or you are at high risk for heart disease, you may need your cholesterol levels checked more frequently.Ongoing high lipid  and cholesterol levels should be treated with medicines if diet and exercise are not working.  If you smoke, find out from your health care provider how to quit. If you do not use tobacco, do not start.  Lung cancer screening is recommended for adults aged 55-80 years who are at high risk for developing lung cancer because of a history of smoking. A yearly low-dose CT scan of the lungs is recommended for people who have at least a 30-pack-year history of smoking and are current smokers or have quit within the past 15 years. A pack year of smoking is smoking an average of 1 pack of cigarettes a day for 1 year (for example, a 30-pack-year history of smoking could mean smoking 1 pack a day for 30 years or 2 packs a day for 15 years). Yearly screening should continue until the smoker has stopped smoking for at least 15 years. Yearly screening should be stopped for people who develop a health problem that would prevent them from having lung cancer treatment.  If you choose to drink alcohol, do not have more than 2 drinks per day. One drink is considered to be 12 oz (360 mL) of beer, 5 oz (150 mL) of wine, or 1.5 oz (45 mL) of liquor.  Avoid the use of street drugs. Do not share needles with anyone. Ask for help if you need support or instructions about stopping the use of drugs.  High blood pressure causes heart disease and increases the risk of stroke. Blood   should be checked at least every 1-2 years. Ongoing high blood pressure should be treated with medicines if weight loss and exercise are not effective.  If you are 45-79 years old, ask your health care provider if you should take aspirin to prevent heart disease.  Diabetes screening involves taking a blood sample to check your fasting blood sugar level. This should be done once every 3 years after age 45 if you are at a normal weight and without risk factors for diabetes. Testing should be considered at a younger age or be carried out more  frequently if you are overweight and have at least 1 risk factor for diabetes.  Colorectal cancer can be detected and often prevented. Most routine colorectal cancer screening begins at the age of 50 and continues through age 75. However, your health care provider may recommend screening at an earlier age if you have risk factors for colon cancer. On a yearly basis, your health care provider may provide home test kits to check for hidden blood in the stool. A small camera at the end of a tube may be used to directly examine the colon (sigmoidoscopy or colonoscopy) to detect the earliest forms of colorectal cancer. Talk to your health care provider about this at age 50 when routine screening begins. A direct exam of the colon should be repeated every 5-10 years through age 75, unless early forms of precancerous polyps or small growths are found.  People who are at an increased risk for hepatitis B should be screened for this virus. You are considered at high risk for hepatitis B if:  You were born in a country where hepatitis B occurs often. Talk with your health care provider about which countries are considered high risk.  Your parents were born in a high-risk country and you have not received a shot to protect against hepatitis B (hepatitis B vaccine).  You have HIV or AIDS.  You use needles to inject street drugs.  You live with, or have sex with, someone who has hepatitis B.  You are a man who has sex with other men (MSM).  You get hemodialysis treatment.  You take certain medicines for conditions like cancer, organ transplantation, and autoimmune conditions.  Hepatitis C blood testing is recommended for all people born from 1945 through 1965 and any individual with known risk factors for hepatitis C.  Healthy men should no longer receive prostate-specific antigen (PSA) blood tests as part of routine cancer screening. Talk to your health care provider about prostate cancer  screening.  Testicular cancer screening is not recommended for adolescents or adult males who have no symptoms. Screening includes self-exam, a health care provider exam, and other screening tests. Consult with your health care provider about any symptoms you have or any concerns you have about testicular cancer.  Practice safe sex. Use condoms and avoid high-risk sexual practices to reduce the spread of sexually transmitted infections (STIs).  You should be screened for STIs, including gonorrhea and chlamydia if:  You are sexually active and are younger than 24 years.  You are older than 24 years, and your health care provider tells you that you are at risk for this type of infection.  Your sexual activity has changed since you were last screened, and you are at an increased risk for chlamydia or gonorrhea. Ask your health care provider if you are at risk.  If you are at risk of being infected with HIV, it is recommended that you take a   prescription medicine daily to prevent HIV infection. This is called pre-exposure prophylaxis (PrEP). You are considered at risk if:  You are a man who has sex with other men (MSM).  You are a heterosexual man who is sexually active with multiple partners.  You take drugs by injection.  You are sexually active with a partner who has HIV.  Talk with your health care provider about whether you are at high risk of being infected with HIV. If you choose to begin PrEP, you should first be tested for HIV. You should then be tested every 3 months for as long as you are taking PrEP.  Use sunscreen. Apply sunscreen liberally and repeatedly throughout the day. You should seek shade when your shadow is shorter than you. Protect yourself by wearing long sleeves, pants, a wide-brimmed hat, and sunglasses year round whenever you are outdoors.  Tell your health care provider of new moles or changes in moles, especially if there is a change in shape or color. Also, tell  your health care provider if a mole is larger than the size of a pencil eraser.  A one-time screening for abdominal aortic aneurysm (AAA) and surgical repair of large AAAs by ultrasound is recommended for men aged 65-75 years who are current or former smokers.  Stay current with your vaccines (immunizations). Document Released: 06/14/2008 Document Revised: 12/22/2013 Document Reviewed: 05/14/2011 ExitCare Patient Information 2015 [employer]. This information is not intended to replace advice given to you by your health care provider. Make sure you discuss any questions you have with your health care provider.  

## 2014-07-15 NOTE — Progress Notes (Signed)
Pre visit review using our clinic review tool, if applicable. No additional management support is needed unless otherwise documented below in the visit note. 

## 2014-07-15 NOTE — Progress Notes (Signed)
Subjective:    Patient ID: Kristopher Martinez, male    DOB: 07-31-1946, 68 y.o.   MRN: [id number]  HPI  68 year old patient who was seen for a health maintenance exam.  He was seen in the ED 2 months ago for what sounds like biliary colic.  He has had no recurrent symptoms.  Abdominal ultrasound was fairly unremarkable, without gallbladder wall thickening.  LFTs were mildly elevated. He has had a laparoscopic right hernia repair.  Past medical history is fairly unremarkable  Family history.  Father died of complications of prostate cancer.  Mother died of colitis.  One sister is well  Social history.  Exercises at his health club at least 3 times weekly.  He Adheres  to a heart healthy diet  Past Medical History  Diagnosis Date  . IBS (irritable bowel syndrome)   . Hemorrhoid   . Low back pain   . Hyperlipidemia   . Syncope 1995    did all testing-post-prandal-pt.states was told had long QT segment  . Flu     03/23/14    . Sinusitis     03/23/14    History   Social History  . Marital Status: Married    Spouse Name: N/A    Number of Children: N/A  . Years of Education: N/A   Occupational History  . Not on file.   Social History Main Topics  . Smoking status: Former Smoker    Quit date: 12/31/1976  . Smokeless tobacco: Never Used  . Alcohol Use: Yes     Comment: wine daily  . Drug Use: No  . Sexual Activity: Yes   Other Topics Concern  . Not on file   Social History Narrative  . No narrative on file    Past Surgical History  Procedure Laterality Date  . Appendectomy    . Hyperplasia tissue excision      atypical lymphoid   . Eye surgery      strabismus as child  . Tonsillectomy    . Inguinal hernia repair Right 04/08/2014    Procedure: LAPAROSCOPIC RIGHT INGUINAL HERNIA REPAIR;  Surgeon: Gayland Curry, MD;  Location: [facility];  Service: General;  Laterality: Right;  . Insertion of mesh Right 04/08/2014    Procedure: INSERTION OF MESH;  Surgeon: Gayland Curry,  MD;  Location: [facility];  Service: General;  Laterality: Right;    Family History  Problem Relation Age of Onset  . Prostate cancer    . Colitis Mother   . Alcohol abuse Mother   . Cancer Father     prostate cancer    No Known Allergies  Current Outpatient Prescriptions on File Prior to Visit  Medication Sig Dispense Refill  . Ibuprofen-Diphenhydramine Cit (ADVIL PM PO) Take 1 tablet by mouth at bedtime as needed (pain/sleep).      . Melatonin 3 MG CAPS Take 3 mg by mouth at bedtime as needed. For sleep      . tadalafil (CIALIS) 20 MG tablet Take 0.5-1 tablets (10-20 mg total) by mouth every other day as needed for erectile dysfunction.  5 tablet  11   No current facility-administered medications on file prior to visit.    BP 100/64  Pulse 64  Temp(Src) 97.8 F (36.6 C) (Oral)  Resp 20  Ht 5' 9.5" (1.765 m)  Wt 157 lb (71.215 kg)  BMI 22.86 kg/m2  SpO2 98%   1. Risk factors, based on past  M,S,F history-  no modifiable  cardiovascular risk factors  2.  Physical activities: Remains quite active physically with vigorous health club.  This patient with a personal trainer 3 times weekly  3.  Depression/mood: No history depression or mood disorder  4.  Hearing: No deficits  5.  ADL's: Independent  6.  Fall risk: Low  7.  Home safety: No problems identified  8.  Height weight, and visual acuity; height weight, stable.  No change in visual acuity  9.  Counseling: We'll continue active lifestyle, and heart healthy diet  10. Lab orders based on risk factors: Temperature profile be reviewed, including LFTs, and lipid panel.  A PSA will also be reviewed  11. Referral : Not appropriate at this time  12. Care plan: We'll continue heart healthy diet and active lifestyle.  Patient is at his ideal body weight.  Patient is up-to-date on colonoscopies  13. Cognitive assessment: Alert and oriented.  Normal affect.  No cognitive dysfunction    Review of Systems    Constitutional: Negative for fever, chills, activity change, appetite change and fatigue.  HENT: Negative for congestion, dental problem, ear pain, hearing loss, mouth sores, rhinorrhea, sinus pressure, sneezing, tinnitus, trouble swallowing and voice change.   Eyes: Negative for photophobia, pain, redness and visual disturbance.  Respiratory: Negative for apnea, cough, choking, chest tightness, shortness of breath and wheezing.   Cardiovascular: Negative for chest pain, palpitations and leg swelling.  Gastrointestinal: Negative for nausea, vomiting, abdominal pain, diarrhea, constipation, blood in stool, abdominal distention, anal bleeding and rectal pain.  Genitourinary: Negative for dysuria, urgency, frequency, hematuria, flank pain, decreased urine volume, discharge, penile swelling, scrotal swelling, difficulty urinating, genital sores and testicular pain.  Musculoskeletal: Negative for arthralgias, back pain, gait problem, joint swelling, myalgias, neck pain and neck stiffness.  Skin: Negative for color change, rash and wound.  Neurological: Negative for dizziness, tremors, seizures, syncope, facial asymmetry, speech difficulty, weakness, light-headedness, numbness and headaches.  Hematological: Negative for adenopathy. Does not bruise/bleed easily.  Psychiatric/Behavioral: Negative for suicidal ideas, hallucinations, behavioral problems, confusion, sleep disturbance, self-injury, dysphoric mood, decreased concentration and agitation. The patient is not nervous/anxious.        Objective:   Physical Exam  Constitutional: He appears well-developed and well-nourished.  HENT:  Head: Normocephalic and atraumatic.  Right Ear: External ear normal.  Left Ear: External ear normal.  Nose: Nose normal.  Mouth/Throat: Oropharynx is clear and moist.  Eyes: Conjunctivae and EOM are normal. Pupils are equal, round, and reactive to light. No scleral icterus.  Neck: Normal range of motion. Neck  supple. No JVD present. No thyromegaly present.  Cardiovascular: Regular rhythm, normal heart sounds and intact distal pulses.  Exam reveals no gallop and no friction rub.   No murmur heard. Pulmonary/Chest: Effort normal and breath sounds normal. He exhibits no tenderness.  Abdominal: Soft. Bowel sounds are normal. He exhibits no distension and no mass. There is no tenderness.  Right lower quadrant scar  Genitourinary: Prostate normal and penis normal. Guaiac negative stool.  Musculoskeletal: Normal range of motion. He exhibits no edema and no tenderness.  Lymphadenopathy:    He has no cervical adenopathy.  Neurological: He is alert. He has normal reflexes. No cranial nerve deficit. Coordination normal.  Skin: Skin is warm and dry. No rash noted.  Psychiatric: He has a normal mood and affect. His behavior is normal.          Assessment & Plan:   Preventive health examination Family history of prostate cancer.  PSA screening.  Discussed at length.  In view of his family, history we'll check a PSA History of dyslipidemia.  We'll check a lipid profile Status post laparoscopic hernia repair

## 2014-07-16 LAB — HEPATITIS C ANTIBODY: HCV Ab: NEGATIVE

## 2014-07-25 ENCOUNTER — Encounter: Payer: Self-pay | Admitting: Internal Medicine

## 2014-07-26 NOTE — Telephone Encounter (Signed)
Left message on voicemail to call office on home and mobile.  

## 2014-07-27 NOTE — Telephone Encounter (Signed)
Spoke to pt told him PSA is still in normal range, Dr. Raliegh Ip is out of the office will be back Monday will have him review and get back to him. Pt verbalized understanding.

## 2014-08-02 ENCOUNTER — Other Ambulatory Visit: Payer: Self-pay | Admitting: Internal Medicine

## 2014-08-02 DIAGNOSIS — Z8042 Family history of malignant neoplasm of prostate: Secondary | ICD-10-CM

## 2014-10-27 ENCOUNTER — Encounter: Payer: Self-pay | Admitting: Family

## 2014-11-15 ENCOUNTER — Ambulatory Visit: Payer: Medicare Other | Admitting: *Deleted

## 2014-11-16 ENCOUNTER — Ambulatory Visit (INDEPENDENT_AMBULATORY_CARE_PROVIDER_SITE_OTHER): Payer: Medicare Other | Admitting: *Deleted

## 2014-11-16 ENCOUNTER — Ambulatory Visit: Payer: Medicare Other | Admitting: *Deleted

## 2014-11-16 DIAGNOSIS — Z23 Encounter for immunization: Secondary | ICD-10-CM

## 2015-01-04 ENCOUNTER — Other Ambulatory Visit (INDEPENDENT_AMBULATORY_CARE_PROVIDER_SITE_OTHER): Payer: Medicare Other

## 2015-01-04 DIAGNOSIS — Z125 Encounter for screening for malignant neoplasm of prostate: Secondary | ICD-10-CM

## 2015-01-04 DIAGNOSIS — Z8042 Family history of malignant neoplasm of prostate: Secondary | ICD-10-CM | POA: Diagnosis not present

## 2015-01-04 LAB — PSA: PSA: 1.63 ng/mL (ref 0.10–4.00)

## 2015-06-24 ENCOUNTER — Encounter: Payer: Self-pay | Admitting: Internal Medicine

## 2015-06-24 DIAGNOSIS — H524 Presbyopia: Secondary | ICD-10-CM | POA: Diagnosis not present

## 2015-06-24 NOTE — Telephone Encounter (Signed)
done

## 2015-06-25 ENCOUNTER — Encounter: Payer: Self-pay | Admitting: Internal Medicine

## 2015-06-27 ENCOUNTER — Other Ambulatory Visit: Payer: Self-pay | Admitting: *Deleted

## 2015-06-27 MED ORDER — TADALAFIL 20 MG PO TABS: 10.0000 mg | ORAL_TABLET | ORAL | Status: DC | PRN

## 2015-07-06 ENCOUNTER — Encounter: Payer: Self-pay | Admitting: Internal Medicine

## 2015-07-18 ENCOUNTER — Ambulatory Visit (INDEPENDENT_AMBULATORY_CARE_PROVIDER_SITE_OTHER): Payer: Medicare Other | Admitting: Internal Medicine

## 2015-07-18 ENCOUNTER — Encounter: Payer: Self-pay | Admitting: Internal Medicine

## 2015-07-18 VITALS — BP 112/70 | HR 69 | Temp 98.4°F | Resp 20 | Ht 69.5 in | Wt 167.0 lb

## 2015-07-18 DIAGNOSIS — Z0000 Encounter for general adult medical examination without abnormal findings: Secondary | ICD-10-CM

## 2015-07-18 DIAGNOSIS — E785 Hyperlipidemia, unspecified: Secondary | ICD-10-CM

## 2015-07-18 LAB — CBC WITH DIFFERENTIAL/PLATELET
BASOS ABS: 0 10*3/uL (ref 0.0–0.1)
Basophils Relative: 0.5 % (ref 0.0–3.0)
Eosinophils Absolute: 0 10*3/uL (ref 0.0–0.7)
Eosinophils Relative: 0.5 % (ref 0.0–5.0)
HEMATOCRIT: 48.6 % (ref 39.0–52.0)
HEMOGLOBIN: 16.6 g/dL (ref 13.0–17.0)
LYMPHS PCT: 21.1 % (ref 12.0–46.0)
Lymphs Abs: 1.4 10*3/uL (ref 0.7–4.0)
MCHC: 34.1 g/dL (ref 30.0–36.0)
MCV: 98.2 fl (ref 78.0–100.0)
MONO ABS: 0.7 10*3/uL (ref 0.1–1.0)
MONOS PCT: 9.6 % (ref 3.0–12.0)
NEUTROS ABS: 4.6 10*3/uL (ref 1.4–7.7)
Neutrophils Relative %: 68.3 % (ref 43.0–77.0)
PLATELETS: 224 10*3/uL (ref 150.0–400.0)
RBC: 4.95 Mil/uL (ref 4.22–5.81)
RDW: 13.7 % (ref 11.5–15.5)
WBC: 6.8 10*3/uL (ref 4.0–10.5)

## 2015-07-18 LAB — COMPREHENSIVE METABOLIC PANEL
ALK PHOS: 50 U/L (ref 39–117)
ALT: 31 U/L (ref 0–53)
AST: 27 U/L (ref 0–37)
Albumin: 4.6 g/dL (ref 3.5–5.2)
BUN: 18 mg/dL (ref 6–23)
CO2: 27 mEq/L (ref 19–32)
Calcium: 9.2 mg/dL (ref 8.4–10.5)
Chloride: 103 mEq/L (ref 96–112)
Creatinine, Ser: 0.83 mg/dL (ref 0.40–1.50)
GFR: 97.66 mL/min (ref >60.00)
Glucose, Bld: 93 mg/dL (ref 70–99)
POTASSIUM: 4.1 meq/L (ref 3.5–5.1)
Sodium: 140 mEq/L (ref 135–145)
Total Bilirubin: 0.7 mg/dL (ref 0.2–1.2)
Total Protein: 7.3 g/dL (ref 6.0–8.3)

## 2015-07-18 LAB — LIPID PANEL
Cholesterol: 235 mg/dL — ABNORMAL HIGH (ref 0–200)
HDL: 102.7 mg/dL (ref >39.00)
LDL Cholesterol: 123 mg/dL — ABNORMAL HIGH (ref 0–99)
NonHDL: 132.3
TRIGLYCERIDES: 46 mg/dL (ref 0.0–149.0)
Total CHOL/HDL Ratio: 2
VLDL: 9.2 mg/dL (ref 0.0–40.0)

## 2015-07-18 LAB — TSH: TSH: 1.72 u[IU]/mL (ref 0.35–4.50)

## 2015-07-18 NOTE — Progress Notes (Signed)
Pre visit review using our clinic review tool, if applicable. No additional management support is needed unless otherwise documented below in the visit note. 

## 2015-07-18 NOTE — Progress Notes (Signed)
Subjective:    Patient ID: Kristopher Martinez, male    DOB: 1946/02/04, 69 y.o.   MRN: [id number]  HPI  69 year-old patient who was seen for a health maintenance exam.  He was seen in the ED 14  months ago for what sounds like biliary colic.  He has had no recurrent symptoms.  Abdominal ultrasound was fairly unremarkable, without gallbladder wall thickening.  LFTs were mildly elevated. He has had a laparoscopic right hernia repair.  Past medical history is fairly unremarkable  Family history.  Father died of complications of prostate cancer.  Mother died of colitis.  One sister is well  Social history.  Exercises at his health club at least 3 times weekly.  He Adheres  to a heart healthy diet  Past Medical History  Diagnosis Date  . IBS (irritable bowel syndrome)   . Hemorrhoid   . Low back pain   . Hyperlipidemia   . Syncope 1995    did all testing-post-prandal-pt.states was told had long QT segment  . Flu     03/23/14    . Sinusitis     03/23/14    History   Social History  . Marital Status: Married    Spouse Name: N/A  . Number of Children: N/A  . Years of Education: N/A   Occupational History  . Not on file.   Social History Main Topics  . Smoking status: Former Smoker    Quit date: 12/31/1976  . Smokeless tobacco: Never Used  . Alcohol Use: Yes     Comment: wine daily  . Drug Use: No  . Sexual Activity: Yes   Other Topics Concern  . Not on file   Social History Narrative    Past Surgical History  Procedure Laterality Date  . Appendectomy    . Hyperplasia tissue excision      atypical lymphoid   . Eye surgery      strabismus as child  . Tonsillectomy    . Inguinal hernia repair Right 04/08/2014    Procedure: LAPAROSCOPIC RIGHT INGUINAL HERNIA REPAIR;  Surgeon: Gayland Curry, MD;  Location: [facility];  Service: General;  Laterality: Right;  . Insertion of mesh Right 04/08/2014    Procedure: INSERTION OF MESH;  Surgeon: Gayland Curry, MD;  Location: [facility];   Service: General;  Laterality: Right;    Family History  Problem Relation Age of Onset  . Prostate cancer    . Colitis Mother   . Alcohol abuse Mother   . Cancer Father     prostate cancer    No Known Allergies  Current Outpatient Prescriptions on File Prior to Visit  Medication Sig Dispense Refill  . Ibuprofen-Diphenhydramine Cit (ADVIL PM PO) Take 1 tablet by mouth at bedtime as needed (pain/sleep).    . Melatonin 3 MG CAPS Take 3 mg by mouth at bedtime as needed. For sleep    . tadalafil (CIALIS) 20 MG tablet Take 0.5-1 tablets (10-20 mg total) by mouth every other day as needed for erectile dysfunction. 3 tablet 5   No current facility-administered medications on file prior to visit.    BP 112/70 mmHg  Pulse 69  Temp(Src) 98.4 F (36.9 C) (Oral)  Resp 20  Ht 5' 9.5" (1.765 m)  Wt 167 lb (75.751 kg)  BMI 24.32 kg/m2  SpO2 98%   1. Risk factors, based on past  M,S,F history-  no modifiable cardiovascular risk factors  2.  Physical activities: Remains quite  active physically with vigorous health club.  This patient with a personal trainer 3 times weekly  3.  Depression/mood: No history depression or mood disorder  4.  Hearing: No deficits  5.  ADL's: Independent  6.  Fall risk: Low  7.  Home safety: No problems identified  8.  Height weight, and visual acuity; height weight, stable.  No change in visual acuity  9.  Counseling: We'll continue active lifestyle, and heart healthy diet  10. Lab orders based on risk factors: Temperature profile be reviewed, including LFTs, and lipid panel.  A PSA will also be reviewed  11. Referral : Not appropriate at this time  12. Care plan: We'll continue heart healthy diet and active lifestyle.  Patient is at his ideal body weight.  Patient is up-to-date on colonoscopies  13. Cognitive assessment: Alert and oriented.  Normal affect.  No cognitive dysfunction  14.  Preventive services will include annual health screening  with lab.  He will be consider for a follow-up colonoscopy in 2 years.  Anuli examinations.  Encouraged.  Annual prostate examinations will be continued.  Patient was provided with a written and personalized care plan.  He will continue annual eye exams  15.  Provider list includes primary care and ophthalmology    Review of Systems  Constitutional: Negative for fever, chills, activity change, appetite change and fatigue.  HENT: Negative for congestion, dental problem, ear pain, hearing loss, mouth sores, rhinorrhea, sinus pressure, sneezing, tinnitus, trouble swallowing and voice change.   Eyes: Negative for photophobia, pain, redness and visual disturbance.  Respiratory: Negative for apnea, cough, choking, chest tightness, shortness of breath and wheezing.   Cardiovascular: Negative for chest pain, palpitations and leg swelling.  Gastrointestinal: Negative for nausea, vomiting, abdominal pain, diarrhea, constipation, blood in stool, abdominal distention, anal bleeding and rectal pain.  Genitourinary: Negative for dysuria, urgency, frequency, hematuria, flank pain, decreased urine volume, discharge, penile swelling, scrotal swelling, difficulty urinating, genital sores and testicular pain.  Musculoskeletal: Negative for myalgias, back pain, joint swelling, arthralgias, gait problem, neck pain and neck stiffness.  Skin: Negative for color change, rash and wound.  Neurological: Negative for dizziness, tremors, seizures, syncope, facial asymmetry, speech difficulty, weakness, light-headedness, numbness and headaches.  Hematological: Negative for adenopathy. Does not bruise/bleed easily.  Psychiatric/Behavioral: Negative for suicidal ideas, hallucinations, behavioral problems, confusion, sleep disturbance, self-injury, dysphoric mood, decreased concentration and agitation. The patient is not nervous/anxious.        Objective:   Physical Exam  Constitutional: He appears well-developed and  well-nourished.  HENT:  Head: Normocephalic and atraumatic.  Right Ear: External ear normal.  Left Ear: External ear normal.  Nose: Nose normal.  Mouth/Throat: Oropharynx is clear and moist.  Eyes: Conjunctivae and EOM are normal. Pupils are equal, round, and reactive to light. No scleral icterus.  Neck: Normal range of motion. Neck supple. No JVD present. No thyromegaly present.  Cardiovascular: Regular rhythm, normal heart sounds and intact distal pulses.  Exam reveals no gallop and no friction rub.   No murmur heard. Pulmonary/Chest: Effort normal and breath sounds normal. He exhibits no tenderness.  Abdominal: Soft. Bowel sounds are normal. He exhibits no distension and no mass. There is no tenderness.  Right lower quadrant scar  Genitourinary: Prostate normal and penis normal. Guaiac negative stool.  Musculoskeletal: Normal range of motion. He exhibits no edema or tenderness.  Lymphadenopathy:    He has no cervical adenopathy.  Neurological: He is alert. He has normal reflexes. No cranial  nerve deficit. Coordination normal.  Skin: Skin is warm and dry. No rash noted.  Psychiatric: He has a normal mood and affect. His behavior is normal.          Assessment & Plan:   Preventive health examination Family history of prostate cancer.  PSA screening.  Discussed at length.  In view of his family, history we'll check a PSA History of dyslipidemia.  We'll check a lipid profile Status post laparoscopic hernia repair

## 2015-07-18 NOTE — Patient Instructions (Signed)
It is important that you exercise regularly, at least 20 minutes 3 to 4 times per week.  If you develop chest pain or shortness of breath seek  medical attention.  Return in one year for follow-up  Health Maintenance A healthy lifestyle and preventative care can promote health and wellness.  Maintain regular health, dental, and eye exams.  Eat a healthy diet. Foods like vegetables, fruits, whole grains, low-fat dairy products, and lean protein foods contain the nutrients you need and are low in calories. Decrease your intake of foods high in solid fats, added sugars, and salt. Get information about a proper diet from your health care provider, if necessary.  Regular physical exercise is one of the most important things you can do for your health. Most adults should get at least 150 minutes of moderate-intensity exercise (any activity that increases your heart rate and causes you to sweat) each week. In addition, most adults need muscle-strengthening exercises on 2 or more days a week.   Maintain a healthy weight. The body mass index (BMI) is a screening tool to identify possible weight problems. It provides an estimate of body fat based on height and weight. Your health care provider can find your BMI and can help you achieve or maintain a healthy weight. For males 20 years and older:  A BMI below 18.5 is considered underweight.  A BMI of 18.5 to 24.9 is normal.  A BMI of 25 to 29.9 is considered overweight.  A BMI of 30 and above is considered obese.  Maintain normal blood lipids and cholesterol by exercising and minimizing your intake of saturated fat. Eat a balanced diet with plenty of fruits and vegetables. Blood tests for lipids and cholesterol should begin at age 20 and be repeated every 5 years. If your lipid or cholesterol levels are high, you are over age 50, or you are at high risk for heart disease, you may need your cholesterol levels checked more frequently.Ongoing high lipid  and cholesterol levels should be treated with medicines if diet and exercise are not working.  If you smoke, find out from your health care provider how to quit. If you do not use tobacco, do not start.  Lung cancer screening is recommended for adults aged 55-80 years who are at high risk for developing lung cancer because of a history of smoking. A yearly low-dose CT scan of the lungs is recommended for people who have at least a 30-pack-year history of smoking and are current smokers or have quit within the past 15 years. A pack year of smoking is smoking an average of 1 pack of cigarettes a day for 1 year (for example, a 30-pack-year history of smoking could mean smoking 1 pack a day for 30 years or 2 packs a day for 15 years). Yearly screening should continue until the smoker has stopped smoking for at least 15 years. Yearly screening should be stopped for people who develop a health problem that would prevent them from having lung cancer treatment.  If you choose to drink alcohol, do not have more than 2 drinks per day. One drink is considered to be 12 oz (360 mL) of beer, 5 oz (150 mL) of wine, or 1.5 oz (45 mL) of liquor.  Avoid the use of street drugs. Do not share needles with anyone. Ask for help if you need support or instructions about stopping the use of drugs.  High blood pressure causes heart disease and increases the risk of stroke. Blood   pressure should be checked at least every 1-2 years. Ongoing high blood pressure should be treated with medicines if weight loss and exercise are not effective.  If you are 45-79 years old, ask your health care provider if you should take aspirin to prevent heart disease.  Diabetes screening involves taking a blood sample to check your fasting blood sugar level. This should be done once every 3 years after age 45 if you are at a normal weight and without risk factors for diabetes. Testing should be considered at a younger age or be carried out more  frequently if you are overweight and have at least 1 risk factor for diabetes.  Colorectal cancer can be detected and often prevented. Most routine colorectal cancer screening begins at the age of 50 and continues through age 75. However, your health care provider may recommend screening at an earlier age if you have risk factors for colon cancer. On a yearly basis, your health care provider may provide home test kits to check for hidden blood in the stool. A small camera at the end of a tube may be used to directly examine the colon (sigmoidoscopy or colonoscopy) to detect the earliest forms of colorectal cancer. Talk to your health care provider about this at age 50 when routine screening begins. A direct exam of the colon should be repeated every 5-10 years through age 75, unless early forms of precancerous polyps or small growths are found.  People who are at an increased risk for hepatitis B should be screened for this virus. You are considered at high risk for hepatitis B if:  You were born in a country where hepatitis B occurs often. Talk with your health care provider about which countries are considered high risk.  Your parents were born in a high-risk country and you have not received a shot to protect against hepatitis B (hepatitis B vaccine).  You have HIV or AIDS.  You use needles to inject street drugs.  You live with, or have sex with, someone who has hepatitis B.  You are a man who has sex with other men (MSM).  You get hemodialysis treatment.  You take certain medicines for conditions like cancer, organ transplantation, and autoimmune conditions.  Hepatitis C blood testing is recommended for all people born from 1945 through 1965 and any individual with known risk factors for hepatitis C.  Healthy men should no longer receive prostate-specific antigen (PSA) blood tests as part of routine cancer screening. Talk to your health care provider about prostate cancer  screening.  Testicular cancer screening is not recommended for adolescents or adult males who have no symptoms. Screening includes self-exam, a health care provider exam, and other screening tests. Consult with your health care provider about any symptoms you have or any concerns you have about testicular cancer.  Practice safe sex. Use condoms and avoid high-risk sexual practices to reduce the spread of sexually transmitted infections (STIs).  You should be screened for STIs, including gonorrhea and chlamydia if:  You are sexually active and are younger than 24 years.  You are older than 24 years, and your health care provider tells you that you are at risk for this type of infection.  Your sexual activity has changed since you were last screened, and you are at an increased risk for chlamydia or gonorrhea. Ask your health care provider if you are at risk.  If you are at risk of being infected with HIV, it is recommended that you take   a prescription medicine daily to prevent HIV infection. This is called pre-exposure prophylaxis (PrEP). You are considered at risk if:  You are a man who has sex with other men (MSM).  You are a heterosexual man who is sexually active with multiple partners.  You take drugs by injection.  You are sexually active with a partner who has HIV.  Talk with your health care provider about whether you are at high risk of being infected with HIV. If you choose to begin PrEP, you should first be tested for HIV. You should then be tested every 3 months for as long as you are taking PrEP.  Use sunscreen. Apply sunscreen liberally and repeatedly throughout the day. You should seek shade when your shadow is shorter than you. Protect yourself by wearing long sleeves, pants, a wide-brimmed hat, and sunglasses year round whenever you are outdoors.  Tell your health care provider of new moles or changes in moles, especially if there is a change in shape or color. Also, tell  your health care provider if a mole is larger than the size of a pencil eraser.  A one-time screening for abdominal aortic aneurysm (AAA) and surgical repair of large AAAs by ultrasound is recommended for men aged 65-75 years who are current or former smokers.  Stay current with your vaccines (immunizations). Document Released: 06/14/2008 Document Revised: 12/22/2013 Document Reviewed: 05/14/2011 [employer] Patient Information 2015 [employer]. This information is not intended to replace advice given to you by your health care provider. Make sure you discuss any questions you have with your health care provider.  

## 2015-07-20 ENCOUNTER — Encounter: Payer: Self-pay | Admitting: Internal Medicine

## 2015-12-09 ENCOUNTER — Other Ambulatory Visit: Payer: Self-pay | Admitting: Internal Medicine

## 2015-12-09 ENCOUNTER — Encounter: Payer: Self-pay | Admitting: Internal Medicine

## 2015-12-09 DIAGNOSIS — H9193 Unspecified hearing loss, bilateral: Secondary | ICD-10-CM

## 2015-12-14 ENCOUNTER — Ambulatory Visit (INDEPENDENT_AMBULATORY_CARE_PROVIDER_SITE_OTHER): Payer: Medicare Other | Admitting: *Deleted

## 2015-12-14 DIAGNOSIS — Z23 Encounter for immunization: Secondary | ICD-10-CM | POA: Diagnosis not present

## 2015-12-28 DIAGNOSIS — H903 Sensorineural hearing loss, bilateral: Secondary | ICD-10-CM | POA: Diagnosis not present

## 2016-01-09 ENCOUNTER — Encounter: Payer: Self-pay | Admitting: Internal Medicine

## 2016-02-23 ENCOUNTER — Encounter: Payer: Self-pay | Admitting: Internal Medicine

## 2016-02-23 ENCOUNTER — Other Ambulatory Visit: Payer: Self-pay | Admitting: Internal Medicine

## 2016-02-23 MED ORDER — AMOXICILLIN-POT CLAVULANATE 875-125 MG PO TABS: 1.0000 | ORAL_TABLET | Freq: Two times a day (BID) | ORAL | Status: DC

## 2016-02-24 ENCOUNTER — Telehealth: Payer: Self-pay | Admitting: Internal Medicine

## 2016-02-24 ENCOUNTER — Encounter (HOSPITAL_COMMUNITY): Payer: Self-pay | Admitting: *Deleted

## 2016-02-24 ENCOUNTER — Emergency Department (HOSPITAL_COMMUNITY)
Admission: EM | Admit: 2016-02-24 | Discharge: 2016-02-25 | Disposition: A | Discharge status: Home / Self Care | Payer: Medicare Other | Attending: Emergency Medicine | Admitting: Emergency Medicine

## 2016-02-24 DIAGNOSIS — Z79899 Other long term (current) drug therapy: Secondary | ICD-10-CM | POA: Diagnosis not present

## 2016-02-24 DIAGNOSIS — Z8709 Personal history of other diseases of the respiratory system: Secondary | ICD-10-CM | POA: Insufficient documentation

## 2016-02-24 DIAGNOSIS — R197 Diarrhea, unspecified: Secondary | ICD-10-CM

## 2016-02-24 DIAGNOSIS — R112 Nausea with vomiting, unspecified: Secondary | ICD-10-CM

## 2016-02-24 DIAGNOSIS — Z792 Long term (current) use of antibiotics: Secondary | ICD-10-CM | POA: Insufficient documentation

## 2016-02-24 DIAGNOSIS — Z8719 Personal history of other diseases of the digestive system: Secondary | ICD-10-CM | POA: Diagnosis not present

## 2016-02-24 DIAGNOSIS — Z87891 Personal history of nicotine dependence: Secondary | ICD-10-CM | POA: Insufficient documentation

## 2016-02-24 DIAGNOSIS — Z8639 Personal history of other endocrine, nutritional and metabolic disease: Secondary | ICD-10-CM | POA: Insufficient documentation

## 2016-02-24 MED ORDER — ONDANSETRON 4 MG PO TBDP
4.0000 mg | ORAL_TABLET | Freq: Once | ORAL | Status: AC
  Administered 2016-02-24: 4 mg via ORAL
  Filled 2016-02-24: qty 1

## 2016-02-24 MED ORDER — PENICILLIN V POTASSIUM 500 MG PO TABS: 500.0000 mg | ORAL_TABLET | Freq: Four times a day (QID) | ORAL | Status: DC

## 2016-02-24 NOTE — ED Notes (Signed)
Pt states he started taking Augmentin yesterday and every time he takes it he vomits and has continuously felt nausea and also has diarrhea.  Denies difficulty breathing, sob and there are no hives.  Pt had dental surgery and and he ended up with a sinus infection and he was started on amoxicillan on 2-10 and didn't have any problems until yesterday when he started Augmentin

## 2016-02-24 NOTE — Telephone Encounter (Signed)
[facility] - Client [place] [facility] Patient Name: Kristopher Martinez DOB: April 08, 1946 Initial Comment Caller states is having reaction to Augmentin. Is having diarrhea and nausea and vomiting. Also has a fever of 100 Nurse Assessment Nurse: Doyle Askew, RN, Joelene Millin Date/Time (Eastern Time): 02/24/2016 4:31:18 PM Confirm and document reason for call. If symptomatic, describe symptoms. You must click the next button to save text entered. ---Caller states is having reaction to Augmentin. Is having diarrhea and nausea and vomiting. Also has a fever of 100 oral. STarted taking augmentin last night. Sinus infection. Pt has had diarrhea and vomiting. Pt is taking medication with yogurt and pt is still having lots of stomach upset. Has the patient traveled out of the country within the last 30 days? ---Not Applicable Does the patient have any new or worsening symptoms? ---Yes Will a triage be completed? ---Yes Related visit to physician within the last 2 weeks? ---Yes Does the PT have any chronic conditions? (i.e. diabetes, asthma, etc.) ---No Is this a behavioral health or substance abuse call? ---No Guidelines Guideline Title Affirmed Question Affirmed Notes Vomiting [1] MODERATE vomiting (e.g., 3 - 5 times/ day) AND [2] age > 39 Final Disposition User Go to ED Now (or PCP triage) Doyle Askew, RN, Joelene Millin Comments Nurse called backline and notifed office that pt had outcome of ER but doesn't need ER at this point. Has had 3 dose of Augmentin and N/V and diarrhea started this morning. Would like another antibiotic called in if possible. Referrals GO TO FACILITY REFUSED Disagree/Comply: Disagree Disagree/Comply Reason: Wait and see

## 2016-02-24 NOTE — ED Provider Notes (Signed)
CSN: [id number]     Arrival date & time 02/24/16  2155 History  By signing my name below, I, Kristopher Martinez, attest that this documentation has been prepared under the direction and in the presence of [name], MD. Electronically Signed: [name], ED Scribe. 02/25/2016. 12:23 AM.     Chief Complaint  Patient presents with  . Nausea  . Emesis  . Diarrhea   Patient is a 70 y.o. male presenting with diarrhea. The history is provided by the patient. No language interpreter was used.  Diarrhea Associated symptoms: vomiting   Associated symptoms: no abdominal pain    HPI Comments: [name] is a 70 y.o. male who presents to the Emergency Department complaining of nausea and vomiting onset 1 day prior. Pt states that he has associated diarrhea. Pt states that his symptoms recently began after starting Augmentin 1 day prior. Pt states that he was recently placed on Augmentin for recent sinus infection. Pt states that the sinus infection is due to recently oral surgery complication that affected his sinuses on the left side. Denies abdominal pain.   Past Medical History  Diagnosis Date  . IBS (irritable bowel syndrome)   . Hemorrhoid   . Low back pain   . Hyperlipidemia   . Syncope 1995    did all testing-post-prandal-pt.states was told had long QT segment  . Flu     03/23/14    . Sinusitis     03/23/14   Past Surgical History  Procedure Laterality Date  . Appendectomy    . Hyperplasia tissue excision      atypical lymphoid   . Eye surgery      strabismus as child  . Tonsillectomy    . Inguinal hernia repair Right 04/08/2014    Procedure: LAPAROSCOPIC RIGHT INGUINAL HERNIA REPAIR;  Surgeon: Gayland Curry, MD;  Location: [facility];  Service: General;  Laterality: Right;  . Insertion of mesh Right 04/08/2014    Procedure: INSERTION OF MESH;  Surgeon: Gayland Curry, MD;  Location: [facility];  Service: General;  Laterality: Right;   Family History  Problem Relation Age  of Onset  . Prostate cancer    . Colitis Mother   . Alcohol abuse Mother   . Cancer Father     prostate cancer   Social History  Substance Use Topics  . Smoking status: Former Smoker    Quit date: 12/31/1976  . Smokeless tobacco: Never Used  . Alcohol Use: Yes     Comment: wine daily    Review of Systems  Gastrointestinal: Positive for nausea, vomiting and diarrhea. Negative for abdominal pain.  All other systems reviewed and are negative.     Allergies  Review of patient's allergies indicates no known allergies.  Home Medications   Prior to Admission medications   Medication Sig Start Date End Date Taking? Authorizing Provider  amoxicillin-clavulanate (AUGMENTIN) 875-125 MG tablet Take 1 tablet by mouth 2 (two) times daily. 02/23/16  Yes [name], MD  Glucosamine-Chondroitin (GLUCOSAMINE CHONDR COMPLEX PO) Take 4 capsules by mouth daily.   Yes Historical Provider, MD  ibuprofen (ADVIL,MOTRIN) 200 MG tablet Take 200 mg by mouth every 6 (six) hours as needed for mild pain.   Yes Historical Provider, MD  Melatonin 3 MG CAPS Take 3 mg by mouth at bedtime as needed. For sleep   Yes Historical Provider, MD  ondansetron (ZOFRAN) 4 MG tablet Take 1 tablet (4 mg total) by mouth every 6 (six)  hours. 02/25/16   [name], MD  penicillin v potassium (VEETID) 500 MG tablet Take 1 tablet (500 mg total) by mouth 4 (four) times daily. 02/24/16   [name], MD  tadalafil (CIALIS) 20 MG tablet Take 0.5-1 tablets (10-20 mg total) by mouth every other day as needed for erectile dysfunction. 06/27/15   [name], MD   BP 122/73 mmHg  Pulse 78  Temp(Src) 98.6 F (37 C) (Oral)  Resp 20  SpO2 95%   Physical Exam  Constitutional: He is oriented to person, place, and time. He appears well-developed and well-nourished. No distress.  HENT:  Head: Normocephalic and atraumatic.  Right Ear: Hearing normal.  Left Ear: Hearing normal.  Nose: Nose normal.   Mouth/Throat: Oropharynx is clear and moist and mucous membranes are normal.  Eyes: Conjunctivae and EOM are normal. Pupils are equal, round, and reactive to light.  Neck: Normal range of motion. Neck supple.  Cardiovascular: Regular rhythm, S1 normal and S2 normal.  Exam reveals no gallop and no friction rub.   No murmur heard. Pulmonary/Chest: Effort normal and breath sounds normal. No respiratory distress. He exhibits no tenderness.  Abdominal: Soft. Normal appearance and bowel sounds are normal. There is no hepatosplenomegaly. There is no tenderness. There is no rebound, no guarding, no tenderness at McBurney's point and negative Murphy's sign. No hernia.  Musculoskeletal: Normal range of motion.  Neurological: He is alert and oriented to person, place, and time. He has normal strength. No cranial nerve deficit or sensory deficit. Coordination normal. GCS eye subscore is 4. GCS verbal subscore is 5. GCS motor subscore is 6.  Skin: Skin is warm, dry and intact. No rash noted. No cyanosis.  Psychiatric: He has a normal mood and affect. His speech is normal and behavior is normal. Thought content normal.  Nursing note and vitals reviewed.   ED Course  Procedures (including critical care time) DIAGNOSTIC STUDIES: Oxygen Saturation is 99% on RA, normal by my interpretation.    COORDINATION OF CARE: 12:01 AM-Discussed treatment plan with pt at bedside and pt agreed to plan.     Labs Review Labs Reviewed  URINALYSIS, ROUTINE W REFLEX MICROSCOPIC (NOT AT [facility]) - Abnormal; Notable for the following:    Color, Urine AMBER (*)    Specific Gravity, Urine 1.031 (*)    Hgb urine dipstick SMALL (*)    Ketones, ur >80 (*)    Protein, ur 30 (*)    All other components within normal limits  CBC WITH DIFFERENTIAL/PLATELET - Abnormal; Notable for the following:    WBC 11.1 (*)    Neutro Abs 9.6 (*)    Lymphs Abs 0.6 (*)    All other components within normal limits  COMPREHENSIVE METABOLIC  PANEL - Abnormal; Notable for the following:    Glucose, Bld 119 (*)    All other components within normal limits  URINE MICROSCOPIC-ADD ON - Abnormal; Notable for the following:    Squamous Epithelial / LPF 0-5 (*)    Bacteria, UA FEW (*)    All other components within normal limits  LIPASE, BLOOD    Imaging Review No results found.    EKG Interpretation None      MDM   Final diagnoses:  Nausea and vomiting, vomiting of unspecified type  Diarrhea, unspecified type    Patient presents to the emergency department with nausea, vomiting and diarrhea after initiating Augmentin for sinus infection. Patient is not experiencing abdominal pain. He has a benign  abdominal exam. Lab work was unremarkable. Patient hydrated, treated with Lomotil and Zofran. He is feeling much better, not tolerating crackers and liquids without any further vomiting. Patient will stop Augmentin. His doctor has already prescribed penicillin for him to use. This is reasonable, as it is felt that his sinus infection has been caused by an infected tooth. We'll provide Zofran for use as needed.  I personally performed the services described in this documentation, which was scribed in my presence. The recorded information has been reviewed and is accurate.      [name], MD 02/25/16 [phone]

## 2016-02-24 NOTE — Telephone Encounter (Signed)
Dr. K, please see message and advise. 

## 2016-02-24 NOTE — Telephone Encounter (Signed)
Please change to Pen-Vee K 500 mg #40 one twice a day Discontinue Augmentin

## 2016-02-24 NOTE — Telephone Encounter (Signed)
Kristopher [facility] w/ [facility] states the amoxicillin-clavulanate (AUGMENTIN) 875-125 MG tablet has given him diarehea  and he has vomited 4-5 times. Maudie Mercury states a med change may be the answer, if the dr could call in something else.  [facility] [address]

## 2016-02-24 NOTE — Telephone Encounter (Signed)
Verified dosage of medication with Dr.K , he said take 4 times a day.    Called pt told him to discontinue Augmentin and take Penicillin 500 mg 4 times a day. Rx sent to pharmacy. Pt verbalized understanding.

## 2016-02-24 NOTE — Telephone Encounter (Signed)
Butch Penny has notified Dr Raliegh Ip

## 2016-02-25 LAB — CBC WITH DIFFERENTIAL/PLATELET
Basophils Absolute: 0 10*3/uL (ref 0.0–0.1)
Basophils Relative: 0 %
EOS PCT: 0 %
Eosinophils Absolute: 0 10*3/uL (ref 0.0–0.7)
HCT: 47.1 % (ref 39.0–52.0)
Hemoglobin: 16.9 g/dL (ref 13.0–17.0)
LYMPHS PCT: 5 %
Lymphs Abs: 0.6 10*3/uL — ABNORMAL LOW (ref 0.7–4.0)
MCH: 34 pg (ref 26.0–34.0)
MCHC: 35.9 g/dL (ref 30.0–36.0)
MCV: 94.8 fL (ref 78.0–100.0)
MONO ABS: 0.9 10*3/uL (ref 0.1–1.0)
Monocytes Relative: 8 %
Neutro Abs: 9.6 10*3/uL — ABNORMAL HIGH (ref 1.7–7.7)
Neutrophils Relative %: 87 %
Platelets: 279 10*3/uL (ref 150–400)
RBC: 4.97 MIL/uL (ref 4.22–5.81)
RDW: 12.7 % (ref 11.5–15.5)
WBC: 11.1 10*3/uL — AB (ref 4.0–10.5)

## 2016-02-25 LAB — COMPREHENSIVE METABOLIC PANEL
ALBUMIN: 4.4 g/dL (ref 3.5–5.0)
ALT: 45 U/L (ref 17–63)
ANION GAP: 14 (ref 5–15)
AST: 40 U/L (ref 15–41)
Alkaline Phosphatase: 86 U/L (ref 38–126)
BILIRUBIN TOTAL: 0.9 mg/dL (ref 0.3–1.2)
BUN: 20 mg/dL (ref 6–20)
CALCIUM: 9 mg/dL (ref 8.9–10.3)
CO2: 22 mmol/L (ref 22–32)
Chloride: 101 mmol/L (ref 101–111)
Creatinine, Ser: 1.07 mg/dL (ref 0.61–1.24)
GFR calc non Af Amer: >60 mL/min (ref >60)
GLUCOSE: 119 mg/dL — AB (ref 65–99)
Potassium: 3.8 mmol/L (ref 3.5–5.1)
SODIUM: 137 mmol/L (ref 135–145)
TOTAL PROTEIN: 7.6 g/dL (ref 6.5–8.1)

## 2016-02-25 LAB — URINALYSIS, ROUTINE W REFLEX MICROSCOPIC
BILIRUBIN URINE: NEGATIVE
Glucose, UA: NEGATIVE mg/dL
Leukocytes, UA: NEGATIVE
Nitrite: NEGATIVE
Protein, ur: 30 mg/dL — AB
Specific Gravity, Urine: 1.031 — ABNORMAL HIGH (ref 1.005–1.030)
pH: 6 (ref 5.0–8.0)

## 2016-02-25 LAB — LIPASE, BLOOD: Lipase: 27 U/L (ref 11–51)

## 2016-02-25 LAB — URINE MICROSCOPIC-ADD ON

## 2016-02-25 MED ORDER — ONDANSETRON HCL 4 MG PO TABS: 4.0000 mg | ORAL_TABLET | Freq: Four times a day (QID) | ORAL | Status: DC

## 2016-02-25 MED ORDER — SODIUM CHLORIDE 0.9 % IV BOLUS (SEPSIS)
1000.0000 mL | Freq: Once | INTRAVENOUS | Status: AC
  Administered 2016-02-25: 1000 mL via INTRAVENOUS

## 2016-02-25 MED ORDER — DIPHENOXYLATE-ATROPINE 2.5-0.025 MG PO TABS
1.0000 | ORAL_TABLET | Freq: Once | ORAL | Status: AC
  Administered 2016-02-25: 1 via ORAL
  Filled 2016-02-25: qty 1

## 2016-02-25 NOTE — Discharge Instructions (Signed)

## 2016-05-17 ENCOUNTER — Encounter: Payer: Self-pay | Admitting: Internal Medicine

## 2016-06-26 DIAGNOSIS — H524 Presbyopia: Secondary | ICD-10-CM | POA: Diagnosis not present

## 2016-07-10 ENCOUNTER — Encounter: Payer: Self-pay | Admitting: Internal Medicine

## 2016-07-11 ENCOUNTER — Other Ambulatory Visit: Payer: Self-pay | Admitting: Internal Medicine

## 2016-08-13 ENCOUNTER — Encounter: Payer: Self-pay | Admitting: Internal Medicine

## 2016-09-14 ENCOUNTER — Other Ambulatory Visit (INDEPENDENT_AMBULATORY_CARE_PROVIDER_SITE_OTHER): Payer: Medicare Other

## 2016-09-14 DIAGNOSIS — Z0000 Encounter for general adult medical examination without abnormal findings: Secondary | ICD-10-CM

## 2016-09-14 LAB — CBC WITH DIFFERENTIAL/PLATELET
BASOS PCT: 0.3 % (ref 0.0–3.0)
Basophils Absolute: 0 10*3/uL (ref 0.0–0.1)
EOS PCT: 1.2 % (ref 0.0–5.0)
Eosinophils Absolute: 0.1 10*3/uL (ref 0.0–0.7)
HEMATOCRIT: 47.4 % (ref 39.0–52.0)
Hemoglobin: 16.5 g/dL (ref 13.0–17.0)
LYMPHS ABS: 1.5 10*3/uL (ref 0.7–4.0)
LYMPHS PCT: 25 % (ref 12.0–46.0)
MCHC: 34.8 g/dL (ref 30.0–36.0)
MCV: 95.9 fl (ref 78.0–100.0)
MONOS PCT: 11.2 % (ref 3.0–12.0)
Monocytes Absolute: 0.7 10*3/uL (ref 0.1–1.0)
NEUTROS ABS: 3.7 10*3/uL (ref 1.4–7.7)
NEUTROS PCT: 62.3 % (ref 43.0–77.0)
PLATELETS: 206 10*3/uL (ref 150.0–400.0)
RBC: 4.94 Mil/uL (ref 4.22–5.81)
RDW: 13.6 % (ref 11.5–15.5)
WBC: 5.9 10*3/uL (ref 4.0–10.5)

## 2016-09-14 LAB — BASIC METABOLIC PANEL
BUN: 17 mg/dL (ref 6–23)
CHLORIDE: 105 meq/L (ref 96–112)
CO2: 29 mEq/L (ref 19–32)
Calcium: 8.7 mg/dL (ref 8.4–10.5)
Creatinine, Ser: 0.93 mg/dL (ref 0.40–1.50)
GFR: 85.35 mL/min (ref >60.00)
GLUCOSE: 89 mg/dL (ref 70–99)
Potassium: 4.6 mEq/L (ref 3.5–5.1)
SODIUM: 142 meq/L (ref 135–145)

## 2016-09-14 LAB — POC URINALSYSI DIPSTICK (AUTOMATED)
BILIRUBIN UA: NEGATIVE
GLUCOSE UA: NEGATIVE
Ketones, UA: NEGATIVE
LEUKOCYTES UA: NEGATIVE
NITRITE UA: NEGATIVE
Protein, UA: NEGATIVE
RBC UA: NEGATIVE
Spec Grav, UA: 1.015
UROBILINOGEN UA: 0.2
pH, UA: 7

## 2016-09-14 LAB — LIPID PANEL
CHOLESTEROL: 212 mg/dL — AB (ref 0–200)
HDL: 88.8 mg/dL (ref >39.00)
LDL Cholesterol: 115 mg/dL — ABNORMAL HIGH (ref 0–99)
NONHDL: 123.57
TRIGLYCERIDES: 44 mg/dL (ref 0.0–149.0)
Total CHOL/HDL Ratio: 2
VLDL: 8.8 mg/dL (ref 0.0–40.0)

## 2016-09-14 LAB — HEPATIC FUNCTION PANEL
ALBUMIN: 4.2 g/dL (ref 3.5–5.2)
ALK PHOS: 54 U/L (ref 39–117)
ALT: 24 U/L (ref 0–53)
AST: 21 U/L (ref 0–37)
Bilirubin, Direct: 0.2 mg/dL (ref 0.0–0.3)
TOTAL PROTEIN: 6.6 g/dL (ref 6.0–8.3)
Total Bilirubin: 0.8 mg/dL (ref 0.2–1.2)

## 2016-09-14 LAB — TSH: TSH: 2.11 u[IU]/mL (ref 0.35–4.50)

## 2016-09-20 ENCOUNTER — Ambulatory Visit (INDEPENDENT_AMBULATORY_CARE_PROVIDER_SITE_OTHER): Payer: Medicare Other | Admitting: Internal Medicine

## 2016-09-20 ENCOUNTER — Encounter: Payer: Self-pay | Admitting: Internal Medicine

## 2016-09-20 VITALS — BP 122/76 | HR 72 | Temp 98.3°F | Resp 20 | Ht 68.5 in | Wt 164.4 lb

## 2016-09-20 DIAGNOSIS — M545 Low back pain: Secondary | ICD-10-CM

## 2016-09-20 DIAGNOSIS — R945 Abnormal results of liver function studies: Secondary | ICD-10-CM | POA: Diagnosis not present

## 2016-09-20 DIAGNOSIS — G8929 Other chronic pain: Secondary | ICD-10-CM

## 2016-09-20 DIAGNOSIS — Z0000 Encounter for general adult medical examination without abnormal findings: Secondary | ICD-10-CM | POA: Diagnosis not present

## 2016-09-20 DIAGNOSIS — Z23 Encounter for immunization: Secondary | ICD-10-CM | POA: Diagnosis not present

## 2016-09-20 DIAGNOSIS — E785 Hyperlipidemia, unspecified: Secondary | ICD-10-CM

## 2016-09-20 NOTE — Progress Notes (Signed)
Subjective:    Patient ID: Kristopher Martinez, male    DOB: [birth year], 70 y.o.   MRN: [id number]  HPI  70  year-old patient who was seen for a health maintenance exam.   He was seen in the ED in the past for what sounds like biliary colic.  He has had no recurrent symptoms.  Abdominal ultrasound was fairly unremarkable, without gallbladder wall thickening.  LFTs were mildly elevated. He has had a laparoscopic right hernia repair.  Past medical history is fairly unremarkable  Family history.  Father died of complications of prostate cancer.  Mother died of colitis.  One sister is well  Social history.  Exercises at his health club at least 3 times weekly.  He Adheres  to a heart healthy diet  Past Medical History:  Diagnosis Date  . Flu    03/23/14    . Hemorrhoid   . Hyperlipidemia   . IBS (irritable bowel syndrome)   . Low back pain   . Sinusitis    03/23/14  . Syncope 1995   did all testing-post-prandal-pt.states was told had long QT segment    Social History   Social History  . Marital status: Married    Spouse name: N/A  . Number of children: N/A  . Years of education: N/A   Occupational History  . Not on file.   Social History Main Topics  . Smoking status: Former Smoker    Quit date: 12/31/1976  . Smokeless tobacco: Never Used  . Alcohol use Yes     Comment: wine daily  . Drug use: No  . Sexual activity: Yes   Other Topics Concern  . Not on file   Social History Narrative  . No narrative on file    Past Surgical History:  Procedure Laterality Date  . APPENDECTOMY    . EYE SURGERY     strabismus as child  . HYPERPLASIA TISSUE EXCISION     atypical lymphoid   . INGUINAL HERNIA REPAIR Right 04/08/2014   Procedure: LAPAROSCOPIC RIGHT INGUINAL HERNIA REPAIR;  Surgeon: Gayland Curry, MD;  Location: [facility];  Service: General;  Laterality: Right;  . INSERTION OF MESH Right 04/08/2014   Procedure: INSERTION OF MESH;  Surgeon: Gayland Curry, MD;  Location: [facility];  Service: General;  Laterality: Right;  . TONSILLECTOMY      Family History  Problem Relation Age of Onset  . Prostate cancer    . Colitis Mother   . Alcohol abuse Mother   . Cancer Father     prostate cancer    Allergies  Allergen Reactions  . Augmentin [Amoxicillin-Pot Clavulanate] Other (See Comments)    Nausea, vomiting, dehydration had to go to hospital.    Current Outpatient Prescriptions on File Prior to Visit  Medication Sig Dispense Refill  . amoxicillin-clavulanate (AUGMENTIN) 875-125 MG tablet Take 1 tablet by mouth 2 (two) times daily. 20 tablet 0  . CIALIS 20 MG tablet TAKE 0.5-1 TABLETS (10-20 MG TOTAL) BY MOUTH EVERY OTHER DAY AS NEEDED FOR ERECTILE DYSFUNCTION. 3 tablet 3  . Glucosamine-Chondroitin (GLUCOSAMINE CHONDR COMPLEX PO) Take 4 capsules by mouth daily.    Marland Kitchen ibuprofen (ADVIL,MOTRIN) 200 MG tablet Take 200 mg by mouth every 6 (six) hours as needed for mild pain.    . Melatonin 3 MG CAPS Take 3 mg by mouth at bedtime as needed. For sleep    . ondansetron (ZOFRAN) 4 MG tablet Take 1 tablet (4 mg total)  by mouth every 6 (six) hours. 12 tablet 0  . penicillin v potassium (VEETID) 500 MG tablet Take 1 tablet (500 mg total) by mouth 4 (four) times daily. 40 tablet 0   No current facility-administered medications on file prior to visit.     There were no vitals taken for this visit.   1. Risk factors, based on past  M,S,F history-  no modifiable cardiovascular risk factors  2.  Physical activities: Remains quite active physically with vigorous health club.  This patient with a personal trainer 3 times weekly.  More recently his exercise level has fallen off a bit, but he plans to resume  3.  Depression/mood: No history depression or mood disorder  4.  Hearing: Moderate deficits.  Uses a hearing aid sporadically  5.  ADL's: Independent  6.  Fall risk: Low  7.  Home safety: No problems identified  8.  Height weight, and visual acuity; height weight,  stable.  No change in visual acuity.  Does have annual eye examinations  9.  Counseling: We'll continue active lifestyle, and heart healthy diet  10. Lab orders based on risk factors: Temperature profile be reviewed, including LFTs, and lipid panel.  A PSA will also be reviewed  11. Referral : Not appropriate at this time  12. Care plan: We'll continue heart healthy diet and active lifestyle.  Patient is at his ideal body weight.  Patient is up-to-date on colonoscopies  13. Cognitive assessment: Alert and oriented.  Normal affect.  No cognitive dysfunction  14.  Preventive services will include annual health screening with lab.  He will be consider for a follow-up colonoscopy in 2 years.  Yearly eye examinations.  Encouraged.  Annual prostate examinations will be continued.  Patient was provided with a written and personalized care plan.  He will continue annual eye exams  15.  Provider list includes primary care and ophthalmology    Review of Systems  Constitutional: Negative for activity change, appetite change, chills, fatigue and fever.  HENT: Negative for congestion, dental problem, ear pain, hearing loss, mouth sores, rhinorrhea, sinus pressure, sneezing, tinnitus, trouble swallowing and voice change.   Eyes: Negative for photophobia, pain, redness and visual disturbance.  Respiratory: Negative for apnea, cough, choking, chest tightness, shortness of breath and wheezing.   Cardiovascular: Negative for chest pain, palpitations and leg swelling.  Gastrointestinal: Negative for abdominal distention, abdominal pain, anal bleeding, blood in stool, constipation, diarrhea, nausea, rectal pain and vomiting.  Genitourinary: Negative for decreased urine volume, difficulty urinating, discharge, dysuria, flank pain, frequency, genital sores, hematuria, penile swelling, scrotal swelling, testicular pain and urgency.  Musculoskeletal: Negative for arthralgias, back pain, gait problem, joint  swelling, myalgias, neck pain and neck stiffness.  Skin: Negative for color change, rash and wound.  Neurological: Negative for dizziness, tremors, seizures, syncope, facial asymmetry, speech difficulty, weakness, light-headedness, numbness and headaches.  Hematological: Negative for adenopathy. Does not bruise/bleed easily.  Psychiatric/Behavioral: Negative for agitation, behavioral problems, confusion, decreased concentration, dysphoric mood, hallucinations, self-injury, sleep disturbance and suicidal ideas. The patient is not nervous/anxious.        Objective:   Physical Exam  Constitutional: He appears well-developed and well-nourished.  HENT:  Head: Normocephalic and atraumatic.  Right Ear: External ear normal.  Left Ear: External ear normal.  Nose: Nose normal.  Mouth/Throat: Oropharynx is clear and moist.  Eyes: Conjunctivae and EOM are normal. Pupils are equal, round, and reactive to light. No scleral icterus.  Neck: Normal range of motion. Neck  supple. No JVD present. No thyromegaly present.  Cardiovascular: Regular rhythm, normal heart sounds and intact distal pulses.  Exam reveals no gallop and no friction rub.   No murmur heard. Pulmonary/Chest: Effort normal and breath sounds normal. He exhibits no tenderness.  Abdominal: Soft. Bowel sounds are normal. He exhibits no distension and no mass. There is no tenderness.  Right lower quadrant scar  Genitourinary: Prostate normal and penis normal. Rectal exam shows guaiac negative stool.  Musculoskeletal: Normal range of motion. He exhibits no edema or tenderness.  Lymphadenopathy:    He has no cervical adenopathy.  Neurological: He is alert. He has normal reflexes. No cranial nerve deficit. Coordination normal.  Skin: Skin is warm and dry. No rash noted.  Psychiatric: He has a normal mood and affect. His behavior is normal.          Assessment & Plan:   Preventive health examination Family history of prostate cancer.   PSA screening.  Discussed at length.  In view of his family, history we'll check a PSA .  Periodically History of dyslipidemia.  We'll check a lipid profile Status post laparoscopic hernia repair  Nyoka Cowden

## 2016-09-20 NOTE — Patient Instructions (Signed)
It is important that you exercise regularly, at least 20 minutes 3 to 4 times per week.  If you develop chest pain or shortness of breath seek  medical attention.  Return in one year for follow-up   

## 2016-09-20 NOTE — Progress Notes (Signed)
Pre visit review using our clinic review tool, if applicable. No additional management support is needed unless otherwise documented below in the visit note. 

## 2016-09-21 ENCOUNTER — Encounter: Payer: Self-pay | Admitting: Internal Medicine

## 2017-01-15 ENCOUNTER — Encounter: Payer: Self-pay | Admitting: Internal Medicine

## 2017-02-04 ENCOUNTER — Encounter: Payer: Self-pay | Admitting: Internal Medicine

## 2017-05-30 ENCOUNTER — Encounter: Payer: Self-pay | Admitting: Internal Medicine

## 2017-05-31 ENCOUNTER — Other Ambulatory Visit: Payer: Self-pay | Admitting: Internal Medicine

## 2017-05-31 MED ORDER — SILDENAFIL CITRATE 20 MG PO TABS: ORAL_TABLET | ORAL | 0 refills | Status: DC

## 2017-06-04 ENCOUNTER — Encounter: Payer: Self-pay | Admitting: Internal Medicine

## 2017-06-07 ENCOUNTER — Encounter: Payer: Self-pay | Admitting: Internal Medicine

## 2017-06-10 ENCOUNTER — Other Ambulatory Visit: Payer: Self-pay | Admitting: Internal Medicine

## 2017-06-10 MED ORDER — SILDENAFIL CITRATE 20 MG PO TABS: ORAL_TABLET | ORAL | 0 refills | Status: DC

## 2017-09-19 ENCOUNTER — Encounter: Payer: Self-pay | Admitting: Internal Medicine

## 2017-09-19 DIAGNOSIS — H524 Presbyopia: Secondary | ICD-10-CM | POA: Diagnosis not present

## 2017-09-24 ENCOUNTER — Ambulatory Visit: Payer: Medicare Other

## 2017-09-25 ENCOUNTER — Ambulatory Visit (INDEPENDENT_AMBULATORY_CARE_PROVIDER_SITE_OTHER): Payer: Medicare Other | Admitting: *Deleted

## 2017-09-25 DIAGNOSIS — Z23 Encounter for immunization: Secondary | ICD-10-CM

## 2017-11-18 ENCOUNTER — Encounter: Payer: Self-pay | Admitting: Internal Medicine

## 2017-11-18 ENCOUNTER — Ambulatory Visit (INDEPENDENT_AMBULATORY_CARE_PROVIDER_SITE_OTHER): Payer: Medicare Other | Admitting: Internal Medicine

## 2017-11-18 VITALS — BP 134/90 | HR 66 | Temp 98.3°F | Ht 68.9 in | Wt 169.0 lb

## 2017-11-18 DIAGNOSIS — Z0000 Encounter for general adult medical examination without abnormal findings: Secondary | ICD-10-CM | POA: Diagnosis not present

## 2017-11-18 DIAGNOSIS — R945 Abnormal results of liver function studies: Secondary | ICD-10-CM

## 2017-11-18 DIAGNOSIS — M545 Low back pain, unspecified: Secondary | ICD-10-CM

## 2017-11-18 DIAGNOSIS — G8929 Other chronic pain: Secondary | ICD-10-CM | POA: Diagnosis not present

## 2017-11-18 DIAGNOSIS — E785 Hyperlipidemia, unspecified: Secondary | ICD-10-CM

## 2017-11-18 LAB — CBC WITH DIFFERENTIAL/PLATELET
Basophils Absolute: 0 10*3/uL (ref 0.0–0.1)
Basophils Relative: 0.3 % (ref 0.0–3.0)
Eosinophils Absolute: 0 10*3/uL (ref 0.0–0.7)
Eosinophils Relative: 0.7 % (ref 0.0–5.0)
HCT: 48.7 % (ref 39.0–52.0)
Hemoglobin: 16.4 g/dL (ref 13.0–17.0)
LYMPHS PCT: 27.8 % (ref 12.0–46.0)
Lymphs Abs: 1.6 10*3/uL (ref 0.7–4.0)
MCHC: 33.7 g/dL (ref 30.0–36.0)
MCV: 99 fl (ref 78.0–100.0)
MONOS PCT: 12.3 % — AB (ref 3.0–12.0)
Monocytes Absolute: 0.7 10*3/uL (ref 0.1–1.0)
NEUTROS PCT: 58.9 % (ref 43.0–77.0)
Neutro Abs: 3.5 10*3/uL (ref 1.4–7.7)
Platelets: 227 10*3/uL (ref 150.0–400.0)
RBC: 4.91 Mil/uL (ref 4.22–5.81)
RDW: 13.1 % (ref 11.5–15.5)
WBC: 5.9 10*3/uL (ref 4.0–10.5)

## 2017-11-18 LAB — LIPID PANEL
CHOLESTEROL: 247 mg/dL — AB (ref 0–200)
HDL: 95 mg/dL (ref >39.00)
LDL Cholesterol: 137 mg/dL — ABNORMAL HIGH (ref 0–99)
NONHDL: 151.78
TRIGLYCERIDES: 73 mg/dL (ref 0.0–149.0)
Total CHOL/HDL Ratio: 3
VLDL: 14.6 mg/dL (ref 0.0–40.0)

## 2017-11-18 LAB — COMPREHENSIVE METABOLIC PANEL
ALBUMIN: 4.8 g/dL (ref 3.5–5.2)
ALK PHOS: 55 U/L (ref 39–117)
ALT: 27 U/L (ref 0–53)
AST: 22 U/L (ref 0–37)
BUN: 18 mg/dL (ref 6–23)
CALCIUM: 9.6 mg/dL (ref 8.4–10.5)
CO2: 29 mEq/L (ref 19–32)
Chloride: 101 mEq/L (ref 96–112)
Creatinine, Ser: 0.84 mg/dL (ref 0.40–1.50)
GFR: 95.67 mL/min (ref >60.00)
Glucose, Bld: 87 mg/dL (ref 70–99)
POTASSIUM: 3.9 meq/L (ref 3.5–5.1)
Sodium: 139 mEq/L (ref 135–145)
TOTAL PROTEIN: 7.2 g/dL (ref 6.0–8.3)
Total Bilirubin: 0.8 mg/dL (ref 0.2–1.2)

## 2017-11-18 LAB — TSH: TSH: 2.84 u[IU]/mL (ref 0.35–4.50)

## 2017-11-18 NOTE — Progress Notes (Signed)
Subjective:    Patient ID: Kristopher Martinez, male    DOB: 1946/04/29, 71 y.o.   MRN: [id number]  HPI  71 year old patient who was seen today for a preventive health examination He enjoys excellent health and really takes no chronic medications.  He has history of some insomnia in the past that has been stable.  He has occasional low back pain that is not an issue at this time. He is due for 10-year follow-up colonoscopy.  Past Medical History:  Diagnosis Date  . Flu    03/23/14    . Hemorrhoid   . Hyperlipidemia   . IBS (irritable bowel syndrome)   . Low back pain   . Sinusitis    03/23/14  . Syncope 1995   did all testing-post-prandal-pt.states was told had long QT segment     Social History   Socioeconomic History  . Marital status: Married    Spouse name: Not on file  . Number of children: Not on file  . Years of education: Not on file  . Highest education level: Not on file  Social Needs  . Financial resource strain: Not on file  . Food insecurity - worry: Not on file  . Food insecurity - inability: Not on file  . Transportation needs - medical: Not on file  . Transportation needs - non-medical: Not on file  Occupational History  . Not on file  Tobacco Use  . Smoking status: Former Smoker    Last attempt to quit: 12/31/1976    Years since quitting: 40.9  . Smokeless tobacco: Never Used  Substance and Sexual Activity  . Alcohol use: Yes    Comment: wine daily  . Drug use: No  . Sexual activity: Yes  Other Topics Concern  . Not on file  Social History Narrative  . Not on file    Past Surgical History:  Procedure Laterality Date  . APPENDECTOMY    . EYE SURGERY     strabismus as child  . HYPERPLASIA TISSUE EXCISION     atypical lymphoid   . INSERTION OF MESH Right 04/08/2014   Performed by Greer Pickerel, [facility] at [facility]  . LAPAROSCOPIC RIGHT INGUINAL HERNIA REPAIR Right 04/08/2014   Performed by Greer Pickerel, MD at [facility] ORS  . TONSILLECTOMY      Family  History  Problem Relation Age of Onset  . Prostate cancer Unknown   . Colitis Mother   . Alcohol abuse Mother   . Cancer Father        prostate cancer    Allergies  Allergen Reactions  . Augmentin [Amoxicillin-Pot Clavulanate] Other (See Comments)    Nausea, vomiting, dehydration had to go to hospital.  . Cleocin [Clindamycin Hcl] Rash    Current Outpatient Medications on File Prior to Visit  Medication Sig Dispense Refill  . Glucosamine-Chondroitin (GLUCOSAMINE CHONDR COMPLEX PO) Take 4 capsules by mouth daily.    Marland Kitchen ibuprofen (ADVIL,MOTRIN) 200 MG tablet Take 200 mg by mouth every 6 (six) hours as needed for mild pain.    . Melatonin 3 MG CAPS Take 3 mg by mouth at bedtime as needed. For sleep    . ondansetron (ZOFRAN) 4 MG tablet Take 1 tablet (4 mg total) by mouth every 6 (six) hours. 12 tablet 0  . sildenafil (REVATIO) 20 MG tablet 3 tablets daily as needed for erectile dysfunction 90 tablet 0   No current facility-administered medications on file prior to visit.     BP 134/90 (  BP Location: Left Arm, Patient Position: Sitting, Cuff Size: Normal) Comment: pt stood for a few minutes when first got in exam room.  Pulse 66   Temp 98.3 F (36.8 C) (Oral)   Ht 5' 8.9" (1.75 m)   Wt 169 lb (76.7 kg)   BMI 25.03 kg/m   Subsequent Medicare wellness visit  1. Risk factors, based on past  M,S,F history.  Patient has no significant cardiovascular risk factors.  He does have a history of mild dyslipidemia treated with diet  2.  Physical activities: Remains quite active.  Does walk his dog almost daily.  In the past has been more active participant at his health club.  3.  Depression/mood: No history of major depression.  Patient has had some increase in situational stress over the past year this has improved  4.  Hearing: No deficits  5.  ADL's: Independent  6.  Fall risk: Low  7.  Home safety: No problems identified  8.  Height weight, and visual acuity; height and weight  fairly stable.  There has been a 5 pound weight gain over the past year due to decrease in physical activities  9.  Counseling: Heart healthy diet more active lifestyle and modest weight loss all encouraged  10. Lab orders based on risk factors: Laboratory profile to include lipid panel will be reviewed  11. Referral : Follow-up 10-year colonoscopy  12. Care plan: Continue efforts at aggressive risk factor modification.  Modest weight loss encouraged  13. Cognitive assessment: Alert and appropriate with normal affect.  No cognitive dysfunction  14. Screening: Patient provided with a written and personalized 5-10 year screening schedule in the AVS.    15. Provider List Update: Primary care ophthalmology and GI    Review of Systems  Constitutional: Negative for appetite change, chills, fatigue and fever.  HENT: Negative for congestion, dental problem, ear pain, hearing loss, sore throat, tinnitus, trouble swallowing and voice change.   Eyes: Negative for pain, discharge and visual disturbance.  Respiratory: Negative for cough, chest tightness, wheezing and stridor.   Cardiovascular: Negative for chest pain, palpitations and leg swelling.  Gastrointestinal: Negative for abdominal distention, abdominal pain, blood in stool, constipation, diarrhea, nausea and vomiting.  Genitourinary: Negative for difficulty urinating, discharge, flank pain, genital sores, hematuria and urgency.  Musculoskeletal: Negative for arthralgias, back pain, gait problem, joint swelling, myalgias and neck stiffness.  Skin: Negative for rash.  Neurological: Negative for dizziness, syncope, speech difficulty, weakness, numbness and headaches.  Hematological: Negative for adenopathy. Does not bruise/bleed easily.  Psychiatric/Behavioral: Negative for behavioral problems and dysphoric mood. The patient is not nervous/anxious.        Objective:   Physical Exam  Constitutional: He appears well-developed and  well-nourished.  HENT:  Head: Normocephalic and atraumatic.  Right Ear: External ear normal.  Left Ear: External ear normal.  Nose: Nose normal.  Mouth/Throat: Oropharynx is clear and moist.  Eyes: Conjunctivae and EOM are normal. Pupils are equal, round, and reactive to light. No scleral icterus.  Neck: Normal range of motion. Neck supple. No JVD present. No thyromegaly present.  Cardiovascular: Regular rhythm, normal heart sounds and intact distal pulses. Exam reveals no gallop and no friction rub.  No murmur heard. Pulmonary/Chest: Effort normal and breath sounds normal. He exhibits no tenderness.  Abdominal: Soft. Bowel sounds are normal. He exhibits no distension and no mass. There is no tenderness.  Right lower quadrant abdominal scar  Genitourinary: Prostate normal and penis normal. Rectal exam shows  guaiac negative stool.  Genitourinary Comments: Prostate normal  Musculoskeletal: Normal range of motion. He exhibits no edema or tenderness.  Lymphadenopathy:    He has no cervical adenopathy.  Neurological: He is alert. He has normal reflexes. No cranial nerve deficit. Coordination normal.  Skin: Skin is warm and dry. No rash noted.  Psychiatric: He has a normal mood and affect. His behavior is normal.          Assessment & Plan:   Preventive health examination Subsequent Medicare wellness visit.  We will set up for 10-year colonoscopy.  Screening lab will be reviewed  Home blood pressure monitoring encouraged;  more active lifestyle recommended  Follow-up 1 year or as needed  Nyoka Cowden

## 2017-11-18 NOTE — Patient Instructions (Signed)
It is important that you exercise regularly, at least 20 minutes 3 to 4 times per week.  If you develop chest pain or shortness of breath seek  medical attention.  Return in one year for follow-up   

## 2017-12-26 ENCOUNTER — Encounter: Payer: Self-pay | Admitting: Internal Medicine

## 2017-12-27 ENCOUNTER — Encounter: Payer: Self-pay | Admitting: Gastroenterology

## 2017-12-31 DIAGNOSIS — K635 Polyp of colon: Secondary | ICD-10-CM

## 2017-12-31 HISTORY — DX: Polyp of colon: K63.5

## 2018-01-01 ENCOUNTER — Encounter: Payer: Self-pay | Admitting: Gastroenterology

## 2018-01-22 ENCOUNTER — Other Ambulatory Visit: Payer: Self-pay

## 2018-01-22 ENCOUNTER — Ambulatory Visit (AMBULATORY_SURGERY_CENTER): Payer: Self-pay | Admitting: *Deleted

## 2018-01-22 ENCOUNTER — Encounter: Payer: Self-pay | Admitting: Gastroenterology

## 2018-01-22 VITALS — Ht 70.0 in | Wt 172.0 lb

## 2018-01-22 DIAGNOSIS — Z1211 Encounter for screening for malignant neoplasm of colon: Secondary | ICD-10-CM

## 2018-01-22 MED ORDER — NA SULFATE-K SULFATE-MG SULF 17.5-3.13-1.6 GM/177ML PO SOLN: ORAL | 0 refills | Status: DC

## 2018-01-22 NOTE — Progress Notes (Signed)
Patient denies any allergies to eggs or soy. Patient denies any problems with anesthesia/sedation. Patient denies any oxygen use at home. Patient denies taking any diet/weight loss medications or blood thinners. EMMI education declined by pt.  

## 2018-02-05 ENCOUNTER — Ambulatory Visit (AMBULATORY_SURGERY_CENTER): Payer: Medicare Other | Admitting: Gastroenterology

## 2018-02-05 ENCOUNTER — Encounter: Payer: Self-pay | Admitting: Gastroenterology

## 2018-02-05 ENCOUNTER — Other Ambulatory Visit: Payer: Self-pay

## 2018-02-05 VITALS — BP 123/67 | HR 56 | Temp 96.9°F | Resp 11 | Ht 70.0 in | Wt 172.0 lb

## 2018-02-05 DIAGNOSIS — D124 Benign neoplasm of descending colon: Secondary | ICD-10-CM | POA: Diagnosis not present

## 2018-02-05 DIAGNOSIS — D125 Benign neoplasm of sigmoid colon: Secondary | ICD-10-CM | POA: Diagnosis not present

## 2018-02-05 DIAGNOSIS — K635 Polyp of colon: Secondary | ICD-10-CM

## 2018-02-05 DIAGNOSIS — D120 Benign neoplasm of cecum: Secondary | ICD-10-CM | POA: Diagnosis not present

## 2018-02-05 DIAGNOSIS — Z1211 Encounter for screening for malignant neoplasm of colon: Secondary | ICD-10-CM | POA: Diagnosis present

## 2018-02-05 DIAGNOSIS — D122 Benign neoplasm of ascending colon: Secondary | ICD-10-CM

## 2018-02-05 DIAGNOSIS — D123 Benign neoplasm of transverse colon: Secondary | ICD-10-CM

## 2018-02-05 HISTORY — PX: COLONOSCOPY: SHX174

## 2018-02-05 MED ORDER — SODIUM CHLORIDE 0.9 % IV SOLN: 500.0000 mL | Freq: Once | INTRAVENOUS | Status: DC

## 2018-02-05 NOTE — Progress Notes (Signed)
Report to PACU, RN, vss, BBS= Clear.  

## 2018-02-05 NOTE — Progress Notes (Signed)
Called to room to assist during endoscopic procedure.  Patient ID and intended procedure confirmed with present staff. Received instructions for my participation in the procedure from the performing physician.  

## 2018-02-05 NOTE — Progress Notes (Signed)
Pt's states no medical or surgical changes since previsit or office visit. 

## 2018-02-05 NOTE — Patient Instructions (Signed)
Handouts given for polyps, Hemorrhoids.   No Aspirin, or NSAIDS (ibuprofen, advil, aleve, naproxen) for 2 weeks  YOU HAD AN ENDOSCOPIC PROCEDURE TODAY AT [facility]:   Refer to the procedure report that was given to you for any specific questions about what was found during the examination.  If the procedure report does not answer your questions, please call your gastroenterologist to clarify.  If you requested that your care partner not be given the details of your procedure findings, then the procedure report has been included in a sealed envelope for you to review at your convenience later.  YOU SHOULD EXPECT: Some feelings of bloating in the abdomen. Passage of more gas than usual.  Walking can help get rid of the air that was put into your GI tract during the procedure and reduce the bloating. If you had a lower endoscopy (such as a colonoscopy or flexible sigmoidoscopy) you may notice spotting of blood in your stool or on the toilet paper. If you underwent a bowel prep for your procedure, you may not have a normal bowel movement for a few days.  Please Note:  You might notice some irritation and congestion in your nose or some drainage.  This is from the oxygen used during your procedure.  There is no need for concern and it should clear up in a day or so.  SYMPTOMS TO REPORT IMMEDIATELY:   Following lower endoscopy (colonoscopy or flexible sigmoidoscopy):  Excessive amounts of blood in the stool  Significant tenderness or worsening of abdominal pains  Swelling of the abdomen that is new, acute  Fever of 100F or higher   For urgent or emergent issues, a gastroenterologist can be reached at any hour by calling [phone].   DIET:  We do recommend a small meal at first, but then you may proceed to your regular diet.  Drink plenty of fluids but you should avoid alcoholic beverages for 24 hours.  ACTIVITY:  You should plan to take it easy for the rest of today and you  should NOT DRIVE or use heavy machinery until tomorrow (because of the sedation medicines used during the test).    FOLLOW UP: Our staff will call the number listed on your records the next business day following your procedure to check on you and address any questions or concerns that you may have regarding the information given to you following your procedure. If we do not reach you, we will leave a message.  However, if you are feeling well and you are not experiencing any problems, there is no need to return our call.  We will assume that you have returned to your regular daily activities without incident.  If any biopsies were taken you will be contacted by phone or by letter within the next 1-3 weeks.  Please call us at [phone] if you have not heard about the biopsies in 3 weeks.    SIGNATURES/CONFIDENTIALITY: You and/or your care partner have signed paperwork which will be entered into your electronic medical record.  These signatures attest to the fact that that the information above on your After Visit Summary has been reviewed and is understood.  Full responsibility of the confidentiality of this discharge information lies with you and/or your care-partner.

## 2018-02-05 NOTE — Op Note (Signed)
[place] Patient Name: Kristopher Martinez Procedure Date: 02/05/2018 7:56 AM MRN: [id number] Endoscopist: Ladene Artist , MD Age: 72 Referring MD:  Date of Birth: Jun 28, 1946 Gender: Male Account #: [id number] Procedure:                Colonoscopy Indications:              Screening for colorectal malignant neoplasm Medicines:                Monitored Anesthesia Care Procedure:                Pre-Anesthesia Assessment:                           - Prior to the procedure, a History and Physical                            was performed, and patient medications and                            allergies were reviewed. The patient's tolerance of                            previous anesthesia was also reviewed. The risks                            and benefits of the procedure and the sedation                            options and risks were discussed with the patient.                            All questions were answered, and informed consent                            was obtained. Prior Anticoagulants: The patient has                            taken no previous anticoagulant or antiplatelet                            agents. ASA Grade Assessment: II - A patient with                            mild systemic disease. After reviewing the risks                            and benefits, the patient was deemed in                            satisfactory condition to undergo the procedure.                           After obtaining informed consent, the colonoscope  was passed under direct vision. Throughout the                            procedure, the patient's blood pressure, pulse, and                            oxygen saturations were monitored continuously. The                            Model PCF-[id number]) scope was introduced                            through the anus and advanced to the the cecum,                            identified by  appendiceal orifice and ileocecal                            valve. The ileocecal valve, appendiceal orifice,                            and rectum were photographed. The quality of the                            bowel preparation was excellent. The colonoscopy                            was performed without difficulty. The patient                            tolerated the procedure well. Scope In: 8:09:05 AM Scope Out: 8:33:30 AM Scope Withdrawal Time: 0 hours 19 minutes 1 second  Total Procedure Duration: 0 hours 24 minutes 25 seconds  Findings:                 The perianal and digital rectal examinations were                            normal.                           A 14 mm polyp was found in the cecum. The polyp was                            sessile. The polyp was removed with a hot snare.                            Resection and retrieval were complete.                           Five sessile polyps were found in the sigmoid colon                            (3), descending colon and ascending colon. The  polyps were 6 to 7 mm in size. These polyps were                            removed with a cold snare. Resection and retrieval                            were complete.                           Two sessile polyps were found in the sigmoid colon                            and transverse colon. The polyps were 5 mm in size.                            These polyps were removed with a cold biopsy                            forceps. Resection and retrieval were complete.                           Internal hemorrhoids were found during                            retroflexion. The hemorrhoids were small and Grade                            I (internal hemorrhoids that do not prolapse).                           The exam was otherwise without abnormality on                            direct and retroflexion views. Complications:            No immediate  complications. Estimated blood loss:                            None. Estimated Blood Loss:     Estimated blood loss: none. Impression:               - One 14 mm polyp in the cecum, removed with a hot                            snare. Resected and retrieved.                           - Five 6 to 7 mm polyps in the sigmoid colon, in                            the descending colon, and in the ascending colon,                            removed with a cold snare. Resected  and retrieved.                           - Two 5 mm polyps in the sigmoid colon and in the                            transverse colon, removed with a cold biopsy                            forceps. Resected and retrieved.                           - Internal hemorrhoids.                           - The examination was otherwise normal on direct                            and retroflexion views. Recommendation:           - Repeat colonoscopy date to be determined after                            pending pathology results are reviewed for                            surveillance, likely 3 years.                           - Patient has a contact number available for                            emergencies. The signs and symptoms of potential                            delayed complications were discussed with the                            patient. Return to normal activities tomorrow.                            Written discharge instructions were provided to the                            patient.                           - Resume previous diet.                           - Continue present medications.                           - Await pathology results.                           - No aspirin, ibuprofen, naproxen, or other  non-steroidal anti-inflammatory drugs for 2 weeks                            after polyp removal. Ladene Artist, MD 02/05/2018 8:40:51 AM This report has been signed  electronically.

## 2018-02-06 ENCOUNTER — Telehealth: Payer: Self-pay

## 2018-02-06 ENCOUNTER — Telehealth: Payer: Self-pay | Admitting: *Deleted

## 2018-02-06 NOTE — Telephone Encounter (Signed)
Left message on answering machine. 

## 2018-02-06 NOTE — Telephone Encounter (Signed)
  Follow up Call-  Call back number 02/05/2018  Post procedure Call Back phone  # [phone]  Permission to leave phone message Yes  Some recent data might be hidden     Patient questions:  Message left to call us if necessary. Second call.

## 2018-02-06 NOTE — Telephone Encounter (Signed)
Patient called back states he is doing fine.

## 2018-02-19 ENCOUNTER — Encounter: Payer: Self-pay | Admitting: Internal Medicine

## 2018-02-20 ENCOUNTER — Encounter: Payer: Self-pay | Admitting: Gastroenterology

## 2018-06-28 ENCOUNTER — Other Ambulatory Visit: Payer: Self-pay | Admitting: Internal Medicine

## 2018-06-30 MED ORDER — SILDENAFIL CITRATE 20 MG PO TABS: ORAL_TABLET | ORAL | 0 refills | Status: DC

## 2018-07-01 ENCOUNTER — Encounter: Payer: Self-pay | Admitting: Internal Medicine

## 2018-09-17 DIAGNOSIS — H524 Presbyopia: Secondary | ICD-10-CM | POA: Diagnosis not present

## 2018-10-15 ENCOUNTER — Ambulatory Visit (INDEPENDENT_AMBULATORY_CARE_PROVIDER_SITE_OTHER): Payer: Medicare Other

## 2018-10-15 DIAGNOSIS — Z23 Encounter for immunization: Secondary | ICD-10-CM

## 2018-10-22 ENCOUNTER — Ambulatory Visit: Payer: Medicare Other | Admitting: Family Medicine

## 2018-10-24 ENCOUNTER — Encounter: Payer: Self-pay | Admitting: Adult Health

## 2018-10-24 ENCOUNTER — Ambulatory Visit: Payer: Medicare Other | Admitting: Adult Health

## 2018-10-24 VITALS — BP 118/70 | Temp 98.1°F | Wt 172.0 lb

## 2018-10-24 DIAGNOSIS — L309 Dermatitis, unspecified: Secondary | ICD-10-CM

## 2018-10-24 NOTE — Patient Instructions (Signed)
It was great meeting you today   It appears as you have eczema   You can use the topical cortisone cream for the next week.   Please get [employer] working hands and apply twice a day

## 2018-10-24 NOTE — Progress Notes (Signed)
Subjective:    Patient ID: Kristopher Martinez, male    DOB: 10/02/1946, 72 y.o.   MRN: [id number]  HPI 72 year old male who  has a past medical history of Flu, Hemorrhoid, Hyperlipidemia, IBS (irritable bowel syndrome), Low back pain, Sinusitis, and Syncope (1995).  He presents to the office today for an acute issue of dry flaky skin on bilateral ears. He has been applying Gold Bond Anti Itch cream and has noticed improvement.    Review of Systems See HPI   Past Medical History:  Diagnosis Date  . Flu    03/23/14    . Hemorrhoid   . Hyperlipidemia   . IBS (irritable bowel syndrome)   . Low back pain   . Sinusitis    03/23/14  . Syncope 1995   did all testing-post-prandal-pt.states was told had long QT segment    Social History   Socioeconomic History  . Marital status: Married    Spouse name: Not on file  . Number of children: Not on file  . Years of education: Not on file  . Highest education level: Not on file  Occupational History  . Not on file  Social Needs  . Financial resource strain: Not on file  . Food insecurity:    Worry: Not on file    Inability: Not on file  . Transportation needs:    Medical: Not on file    Non-medical: Not on file  Tobacco Use  . Smoking status: Former Smoker    Last attempt to quit: 12/31/1976    Years since quitting: 41.8  . Smokeless tobacco: Never Used  Substance and Sexual Activity  . Alcohol use: Yes    Alcohol/week: 14.0 standard drinks    Types: 14 Glasses of wine per week    Comment: 1-2 GLASSES wine daily per pt  . Drug use: No  . Sexual activity: Yes  Lifestyle  . Physical activity:    Days per week: Not on file    Minutes per session: Not on file  . Stress: Not on file  Relationships  . Social connections:    Talks on phone: Not on file    Gets together: Not on file    Attends religious service: Not on file    Active member of club or organization: Not on file    Attends meetings of clubs or organizations: Not  on file    Relationship status: Not on file  . Intimate partner violence:    Fear of current or ex partner: Not on file    Emotionally abused: Not on file    Physically abused: Not on file    Forced sexual activity: Not on file  Other Topics Concern  . Not on file  Social History Narrative  . Not on file    Past Surgical History:  Procedure Laterality Date  . APPENDECTOMY    . COLONOSCOPY  2008   HYPERPLASTIC POLYPS X 8   . EYE SURGERY     strabismus as child  . HYPERPLASIA TISSUE EXCISION     atypical lymphoid   . INGUINAL HERNIA REPAIR Right 04/08/2014   Procedure: LAPAROSCOPIC RIGHT INGUINAL HERNIA REPAIR;  Surgeon: Gayland Curry, MD;  Location: [facility] ORS;  Service: General;  Laterality: Right;  . INSERTION OF MESH Right 04/08/2014   Procedure: INSERTION OF MESH;  Surgeon: Gayland Curry, MD;  Location: [facility] ORS;  Service: General;  Laterality: Right;  . TONSILLECTOMY  Family History  Problem Relation Age of Onset  . Colitis Mother   . Alcohol abuse Mother   . Cancer Father        prostate cancer  . Prostate cancer Father   . Prostate cancer Unknown   . Colon cancer Neg Hx     Allergies  Allergen Reactions  . Augmentin [Amoxicillin-Pot Clavulanate] Other (See Comments)    Nausea, vomiting, dehydration had to go to hospital.  . Cleocin [Clindamycin Hcl] Rash    Current Outpatient Medications on File Prior to Visit  Medication Sig Dispense Refill  . Glucosamine-Chondroitin (GLUCOSAMINE CHONDR COMPLEX PO) Take 4 capsules by mouth daily.    Marland Kitchen ibuprofen (ADVIL,MOTRIN) 200 MG tablet Take 200 mg by mouth every 6 (six) hours as needed for mild pain.    . Ibuprofen-Diphenhydramine Cit (ADVIL PM PO) Take 1 tablet by mouth at bedtime.    Marland Kitchen OVER THE COUNTER MEDICATION Take 1 tablet by mouth at bedtime. POWER TO SLEEP=MELATONIN    . sildenafil (REVATIO) 20 MG tablet 3 tablets daily as needed for erectile dysfunction 90 tablet 0   Current Facility-Administered Medications on  File Prior to Visit  Medication Dose Route Frequency Provider Last Rate Last Dose  . 0.9 %  sodium chloride infusion  500 mL Intravenous Once Lucio Edward T, MD        BP 118/70   Temp 98.1 F (36.7 C) (Oral)   Wt 172 lb (78 kg)   BMI 24.68 kg/m       Objective:   Physical Exam  Constitutional: He is oriented to person, place, and time. He appears well-developed and well-nourished. No distress.  Neurological: He is alert and oriented to person, place, and time.  Skin: Skin is warm and dry. He is not diaphoretic. There is erythema.  Red, dry, flaky skin with cracking noted on bilateral ears.   Psychiatric: He has a normal mood and affect. His behavior is normal. Judgment and thought content normal.  Vitals reviewed.     Assessment & Plan:  1. Eczema, unspecified type - Can apply OTC cortisone cream BID x 7 days  - Advised to get [employer] and apply twice a day  - Follow up if no improvement   Dorothyann Peng, NP

## 2018-11-06 ENCOUNTER — Encounter: Payer: Self-pay | Admitting: Family Medicine

## 2018-11-06 ENCOUNTER — Ambulatory Visit: Payer: Medicare Other | Admitting: Family Medicine

## 2018-11-06 VITALS — BP 120/68 | HR 60 | Temp 98.3°F | Ht 70.0 in | Wt 168.6 lb

## 2018-11-06 DIAGNOSIS — E785 Hyperlipidemia, unspecified: Secondary | ICD-10-CM

## 2018-11-06 DIAGNOSIS — L309 Dermatitis, unspecified: Secondary | ICD-10-CM

## 2018-11-06 NOTE — Assessment & Plan Note (Signed)
Check lipid panel with next blood draw

## 2018-11-06 NOTE — Patient Instructions (Signed)
It was very nice to see you today!  Please continue using aveeno and cortisone cream to the area.  Keep up the good work!  Come back to see me soon for your annual physical.   Take care, Dr Jerline Pain

## 2018-11-06 NOTE — Progress Notes (Signed)
   Subjective:  Kristopher Martinez is a 72 y.o. male who presents today with a chief complaint of rash and to transfer care to this office.   HPI:  Rash Started a couple weeks ago.  Was evaluated and diagnosed with eczema.  He has tried using Aveeno and cortisone cream to the area with some improvement.  Recently started amoxicillin due to an upcoming dental procedure and thinks that this could have contributed.  Rash is located on upper extremities bilaterally.  Rash is very pruritic.  Dyslipidemia Currently on any medications.  Currently diet controlled.  ROS: Per HPI  Objective:  Physical Exam: BP 120/68 (BP Location: Left Arm, Patient Position: Sitting, Cuff Size: Normal)   Pulse 60   Temp 98.3 F (36.8 C) (Oral)   Ht 5\' 10"  (1.778 m)   Wt 168 lb 9.6 oz (76.5 kg)   SpO2 98%   BMI 24.19 kg/m   Wt Readings from Last 3 Encounters:  11/06/18 168 lb 9.6 oz (76.5 kg)  10/24/18 172 lb (78 kg)  02/05/18 172 lb (78 kg)  Gen: NAD, resting comfortably CV: RRR with no murmurs appreciated Pulm: NWOB, CTAB with no crackles, wheezes, or rhonchi Skin: Erythematous, dry, confluent rash on left upper extremity.  Assessment/Plan:  Dyslipidemia Check lipid panel with next blood draw.  Eczema Seems to be improving.  Continue over-the-counter cortisone cream and emollients.  Preventative Healthcare Patient was instructed to return soon for CPE.  [name]. Jerline Pain, MD 11/06/2018 10:36 AM

## 2018-11-21 ENCOUNTER — Encounter: Payer: Self-pay | Admitting: Family Medicine

## 2018-11-21 ENCOUNTER — Other Ambulatory Visit: Payer: Self-pay

## 2018-11-21 MED ORDER — TRIAMCINOLONE ACETONIDE 0.5 % EX OINT: 1.0000 "application " | TOPICAL_OINTMENT | Freq: Two times a day (BID) | CUTANEOUS | 0 refills | Status: DC

## 2019-01-26 ENCOUNTER — Encounter: Payer: Medicare Other | Admitting: Family Medicine

## 2019-01-30 ENCOUNTER — Ambulatory Visit (INDEPENDENT_AMBULATORY_CARE_PROVIDER_SITE_OTHER): Payer: Medicare Other | Admitting: Family Medicine

## 2019-01-30 ENCOUNTER — Encounter: Payer: Self-pay | Admitting: Family Medicine

## 2019-01-30 VITALS — BP 112/76 | HR 68 | Temp 98.7°F | Ht 70.0 in | Wt 167.8 lb

## 2019-01-30 DIAGNOSIS — N529 Male erectile dysfunction, unspecified: Secondary | ICD-10-CM

## 2019-01-30 DIAGNOSIS — Z0001 Encounter for general adult medical examination with abnormal findings: Secondary | ICD-10-CM

## 2019-01-30 DIAGNOSIS — L219 Seborrheic dermatitis, unspecified: Secondary | ICD-10-CM

## 2019-01-30 DIAGNOSIS — E785 Hyperlipidemia, unspecified: Secondary | ICD-10-CM | POA: Diagnosis not present

## 2019-01-30 DIAGNOSIS — L309 Dermatitis, unspecified: Secondary | ICD-10-CM | POA: Insufficient documentation

## 2019-01-30 LAB — LIPID PANEL
CHOLESTEROL: 223 mg/dL — AB (ref 0–200)
HDL: 81.7 mg/dL (ref >39.00)
LDL Cholesterol: 127 mg/dL — ABNORMAL HIGH (ref 0–99)
NonHDL: 141.55
Total CHOL/HDL Ratio: 3
Triglycerides: 72 mg/dL (ref 0.0–149.0)
VLDL: 14.4 mg/dL (ref 0.0–40.0)

## 2019-01-30 LAB — COMPREHENSIVE METABOLIC PANEL WITH GFR
ALT: 30 U/L (ref 0–53)
AST: 22 U/L (ref 0–37)
Albumin: 4.6 g/dL (ref 3.5–5.2)
Alkaline Phosphatase: 61 U/L (ref 39–117)
BUN: 18 mg/dL (ref 6–23)
CO2: 27 meq/L (ref 19–32)
Calcium: 9.7 mg/dL (ref 8.4–10.5)
Chloride: 101 meq/L (ref 96–112)
Creatinine, Ser: 0.88 mg/dL (ref 0.40–1.50)
GFR: 85.01 mL/min
Glucose, Bld: 87 mg/dL (ref 70–99)
Potassium: 4.4 meq/L (ref 3.5–5.1)
Sodium: 138 meq/L (ref 135–145)
Total Bilirubin: 0.9 mg/dL (ref 0.2–1.2)
Total Protein: 7.1 g/dL (ref 6.0–8.3)

## 2019-01-30 LAB — CBC
HCT: 50.1 % (ref 39.0–52.0)
Hemoglobin: 17.1 g/dL — ABNORMAL HIGH (ref 13.0–17.0)
MCHC: 34.2 g/dL (ref 30.0–36.0)
MCV: 98.3 fl (ref 78.0–100.0)
Platelets: 233 10*3/uL (ref 150.0–400.0)
RBC: 5.09 Mil/uL (ref 4.22–5.81)
RDW: 13.5 % (ref 11.5–15.5)
WBC: 5.3 10*3/uL (ref 4.0–10.5)

## 2019-01-30 LAB — TSH: TSH: 2.15 u[IU]/mL (ref 0.35–4.50)

## 2019-01-30 MED ORDER — TRIAMCINOLONE ACETONIDE 0.5 % EX CREA: 1.0000 "application " | TOPICAL_CREAM | Freq: Three times a day (TID) | CUTANEOUS | 0 refills | Status: DC

## 2019-01-30 MED ORDER — KETOCONAZOLE 2 % EX CREA: 1.0000 "application " | TOPICAL_CREAM | Freq: Every day | CUTANEOUS | 3 refills | Status: DC

## 2019-01-30 MED ORDER — TADALAFIL 10 MG PO TABS: 10.0000 mg | ORAL_TABLET | Freq: Every day | ORAL | 0 refills | Status: DC | PRN

## 2019-01-30 NOTE — Progress Notes (Signed)
Subjective:  Kristopher Martinez is a 73 y.o. male who presents today for his annual comprehensive physical exam.    HPI:  He has no acute complaints today.   He has had some irritation and flakiness on his scalp and eyebrows over the past couple of months.  He has tried over-the-counter shampoo with no improvement.  He has a few chronic conditions outlined below:  # Erectile Dysfunction - uses sildenafil as needed  # Eczema - Uses triamcinolone as needed   Lifestyle Diet: No specific diets or eating plans.  Trying to cut down carbs. Exercise: Walk dog a few times per week.  No specific exercises.  Depression screen PHQ 2/9 11/06/2018  Decreased Interest 0  Down, Depressed, Hopeless 0  PHQ - 2 Score 0    There are no preventive care reminders to display for this patient.   ROS: Per HPI, otherwise a complete review of systems was negative.   PMH:  The following were reviewed and entered/updated in epic: Past Medical History:  Diagnosis Date  . Flu    03/23/14    . Hemorrhoid   . Hyperlipidemia   . IBS (irritable bowel syndrome)   . Low back pain   . Sinusitis    03/23/14  . Syncope 1995   did all testing-post-prandal-pt.states was told had long QT segment   Patient Active Problem List   Diagnosis Date Noted  . Eczema 01/30/2019  . Seborrheic dermatitis 01/30/2019  . Erectile dysfunction 01/30/2019  . Dyslipidemia 09/29/2007   Past Surgical History:  Procedure Laterality Date  . APPENDECTOMY    . COLONOSCOPY  2008   HYPERPLASTIC POLYPS X 8   . EYE SURGERY     strabismus as child  . HYPERPLASIA TISSUE EXCISION     atypical lymphoid   . INGUINAL HERNIA REPAIR Right 04/08/2014   Procedure: LAPAROSCOPIC RIGHT INGUINAL HERNIA REPAIR;  Surgeon: Gayland Curry, MD;  Location: [facility];  Service: General;  Laterality: Right;  . INSERTION OF MESH Right 04/08/2014   Procedure: INSERTION OF MESH;  Surgeon: Gayland Curry, MD;  Location: [facility];  Service: General;   Laterality: Right;  . TONSILLECTOMY    . WISDOM TOOTH EXTRACTION      Family History  Problem Relation Age of Onset  . Colitis Mother   . Alcohol abuse Mother   . Cancer Father        prostate cancer  . Prostate cancer Father   . Prostate cancer Unknown   . Colon cancer Neg Hx     Medications- reviewed and updated Current Outpatient Medications  Medication Sig Dispense Refill  . amoxicillin (AMOXIL) 500 MG capsule TAKE 1 CAPSULE 3 TIMES PER DAY UNTIL GONE  0  . ibuprofen (ADVIL,MOTRIN) 200 MG tablet Take 200 mg by mouth every 6 (six) hours as needed for mild pain.    . Ibuprofen-Diphenhydramine Cit (ADVIL PM PO) Take 1 tablet by mouth at bedtime.    Marland Kitchen OVER THE COUNTER MEDICATION Take 1 tablet by mouth at bedtime. POWER TO SLEEP=MELATONIN    . ketoconazole (NIZORAL) 2 % cream Apply 1 application topically daily. 60 g 3  . tadalafil (CIALIS) 10 MG tablet Take 1 tablet (10 mg total) by mouth daily as needed for erectile dysfunction. 10 tablet 0  . triamcinolone cream (KENALOG) 0.5 % Apply 1 application topically 3 (three) times daily. 30 g 0   No current facility-administered medications for this visit.     Allergies-reviewed and updated  Allergies  Allergen Reactions  . Augmentin [Amoxicillin-Pot Clavulanate] Other (See Comments)    Nausea, vomiting, dehydration had to go to hospital.  . Cleocin [Clindamycin Hcl] Rash    Social History   Socioeconomic History  . Marital status: Married    Spouse name: Not on file  . Number of children: 0  . Years of education: Not on file  . Highest education level: Not on file  Occupational History  . Not on file  Social Needs  . Financial resource strain: Not on file  . Food insecurity:    Worry: Not on file    Inability: Not on file  . Transportation needs:    Medical: Not on file    Non-medical: Not on file  Tobacco Use  . Smoking status: Former Smoker    Last attempt to quit: 12/31/1976    Years since quitting: 42.1  .  Smokeless tobacco: Never Used  Substance and Sexual Activity  . Alcohol use: Yes    Alcohol/week: 14.0 standard drinks    Types: 14 Glasses of wine per week    Comment: 1-2 GLASSES wine daily per pt  . Drug use: No  . Sexual activity: Yes  Lifestyle  . Physical activity:    Days per week: Not on file    Minutes per session: Not on file  . Stress: Not on file  Relationships  . Social connections:    Talks on phone: Not on file    Gets together: Not on file    Attends religious service: Not on file    Active member of club or organization: Not on file    Attends meetings of clubs or organizations: Not on file    Relationship status: Not on file  Other Topics Concern  . Not on file  Social History Narrative  . Not on file    Objective:  Physical Exam: BP 112/76 (BP Location: Left Arm, Patient Position: Sitting, Cuff Size: Normal)   Pulse 68   Temp 98.7 F (37.1 C) (Oral)   Ht 5\' 10"  (1.778 m)   Wt 167 lb 12 oz (76.1 kg)   SpO2 97%   BMI 24.07 kg/m   Body mass index is 24.07 kg/m. Wt Readings from Last 3 Encounters:  01/30/19 167 lb 12 oz (76.1 kg)  11/06/18 168 lb 9.6 oz (76.5 kg)  10/24/18 172 lb (78 kg)   Gen: NAD, resting comfortably HEENT: TMs normal bilaterally. OP clear. No thyromegaly noted.  CV: RRR with no murmurs appreciated Pulm: NWOB, CTAB with no crackles, wheezes, or rhonchi GI: Normal bowel sounds present. Soft, Nontender, Nondistended. MSK: no edema, cyanosis, or clubbing noted Skin: Erythematous rash with overlying scale on the right posterior scalp, bilateral eyebrows, and bilateral ears.  Erythematous rash on upper extremities bilaterally, improved in appearance compared to previous exam. Neuro: CN2-12 grossly intact. Strength 5/5 in upper and lower extremities. Reflexes symmetric and intact bilaterally.  Psych: Normal affect and thought content  Assessment/Plan:  Seborrheic dermatitis Start topical ketoconazole.  Apply for 5 to 10 minutes  daily for 1 week, then twice weekly as needed.  Erectile dysfunction Stable.  Sent in prescription for Cialis 10 mg daily as needed to [facility].  Eczema Stable.  Continue triamcinolone as needed.  We will send in cream formulation per patient request.  Dyslipidemia Continue lifestyle modifications.  Check lipid panel today.  Preventative Healthcare: Up-to-date on vaccines and screenings.  Patient Counseling(The following topics were reviewed and/or handout was given):  -Nutrition:  Stressed importance of moderation in sodium/caffeine intake, saturated fat and cholesterol, caloric balance, sufficient intake of fresh fruits, vegetables, and fiber.  -Stressed the importance of regular exercise.   -Substance Abuse: Discussed cessation/primary prevention of tobacco, alcohol, or other drug use; driving or other dangerous activities under the influence; availability of treatment for abuse.   -Injury prevention: Discussed safety belts, safety helmets, smoke detector, smoking near bedding or upholstery.   -Sexuality: Discussed sexually transmitted diseases, partner selection, use of condoms, avoidance of unintended pregnancy and contraceptive alternatives.   -Dental health: Discussed importance of regular tooth brushing, flossing, and dental visits.  -Health maintenance and immunizations reviewed. Please refer to Health maintenance section.  Return to care in 1 year for next preventative visit.   [name]. Jerline Pain, MD 01/30/2019 10:35 AM

## 2019-01-30 NOTE — Assessment & Plan Note (Signed)
Stable.  Continue triamcinolone as needed.  We will send in cream formulation per patient request.

## 2019-01-30 NOTE — Patient Instructions (Signed)
It was very nice to see you today!  I will send in cialis to [facility]. Please use the coupon to get it cheaper.  Please use the ketoconazole for your scalp and face.  Please lather in for 5 to 10 minutes and then rinse off.  Do this every day for a week, then twice weekly.  You can use cortisone cream to your ear irritation.  I will send in triamcinolone cream for the eczema on your arms.  Please work on cutting down carbs and stay active.  We will check blood work today.  Come back to see me in 1 year for your next wellness visit, or sooner as needed.  Take care, Dr Parker   Preventive Care 73 Years and Older, Male Preventive care refers to lifestyle choices and visits with your health care provider that can promote health and wellness. What does preventive care include?   A yearly physical exam. This is also called an annual well check.  Dental exams once or twice a year.  Routine eye exams. Ask your health care provider how often you should have your eyes checked.  Personal lifestyle choices, including: ? Daily care of your teeth and gums. ? Regular physical activity. ? Eating a healthy diet. ? Avoiding tobacco and drug use. ? Limiting alcohol use. ? Practicing safe sex. ? Taking low doses of aspirin every day. ? Taking vitamin and mineral supplements as recommended by your health care provider. What happens during an annual well check? The services and screenings done by your health care provider during your annual well check will depend on your age, overall health, lifestyle risk factors, and family history of disease. Counseling Your health care provider may ask you questions about your:  Alcohol use.  Tobacco use.  Drug use.  Emotional well-being.  Home and relationship well-being.  Sexual activity.  Eating habits.  History of falls.  Memory and ability to understand (cognition).  Work and work environment. Screening You may have the following tests  or measurements:  Height, weight, and BMI.  Blood pressure.  Lipid and cholesterol levels. These may be checked every 5 years, or more frequently if you are over 50 years old.  Skin check.  Lung cancer screening. You may have this screening every year starting at age 55 if you have a 30-pack-year history of smoking and currently smoke or have quit within the past 15 years.  Colorectal cancer screening. All adults should have this screening starting at age 50 and continuing until age 75. You will have tests every 1-10 years, depending on your results and the type of screening test. People at increased risk should start screening at an earlier age. Screening tests may include: ? Guaiac-based fecal occult blood testing. ? Fecal immunochemical test (FIT). ? Stool DNA test. ? Virtual colonoscopy. ? Sigmoidoscopy. During this test, a flexible tube with a tiny camera (sigmoidoscope) is used to examine your rectum and lower colon. The sigmoidoscope is inserted through your anus into your rectum and lower colon. ? Colonoscopy. During this test, a long, thin, flexible tube with a tiny camera (colonoscope) is used to examine your entire colon and rectum.  Prostate cancer screening. Recommendations will vary depending on your family history and other risks.  Hepatitis C blood test.  Hepatitis B blood test.  Sexually transmitted disease (STD) testing.  Diabetes screening. This is done by checking your blood sugar (glucose) after you have not eaten for a while (fasting). You may have this done every   1-3 years.  Abdominal aortic aneurysm (AAA) screening. You may need this if you are a current or former smoker.  Osteoporosis. You may be screened starting at age 45 if you are at high risk. Talk with your health care provider about your test results, treatment options, and if necessary, the need for more tests. Vaccines Your health care provider may recommend certain vaccines, such as:  Influenza  vaccine. This is recommended every year.  Tetanus, diphtheria, and acellular pertussis (Tdap, Td) vaccine. You may need a Td booster every 10 years.  Varicella vaccine. You may need this if you have not been vaccinated.  Zoster vaccine. You may need this after age 60.  Measles, mumps, and rubella (MMR) vaccine. You may need at least one dose of MMR if you were born in 1957 or later. You may also need a second dose.  Pneumococcal 13-valent conjugate (PCV13) vaccine. One dose is recommended after age 57.  Pneumococcal polysaccharide (PPSV23) vaccine. One dose is recommended after age 30.  Meningococcal vaccine. You may need this if you have certain conditions.  Hepatitis A vaccine. You may need this if you have certain conditions or if you travel or work in places where you may be exposed to hepatitis A.  Hepatitis B vaccine. You may need this if you have certain conditions or if you travel or work in places where you may be exposed to hepatitis B.  Haemophilus influenzae type b (Hib) vaccine. You may need this if you have certain risk factors. Talk to your health care provider about which screenings and vaccines you need and how often you need them. This information is not intended to replace advice given to you by your health care provider. Make sure you discuss any questions you have with your health care provider. Document Released: 01/13/2016 Document Revised: 02/06/2018 Document Reviewed: 10/18/2015 Elsevier Interactive Patient Education  2019 [employer].

## 2019-01-30 NOTE — Assessment & Plan Note (Signed)
Start topical ketoconazole.  Apply for 5 to 10 minutes daily for 1 week, then twice weekly as needed.

## 2019-01-30 NOTE — Assessment & Plan Note (Signed)
Continue lifestyle modifications.  Check lipid panel today. 

## 2019-01-30 NOTE — Assessment & Plan Note (Signed)
Stable.  Sent in prescription for Cialis 10 mg daily as needed to [place].

## 2019-01-31 NOTE — Progress Notes (Signed)
Please inform patient of the following:  His cholesterol level is elevated. Recommend starting lipitor 40mg  daily to improve numbers and lower his risk of heart attack and stroke. Please send in for patient.  All of his other labs are normal. We will recheck again in 1 year.  [name]. Jerline Pain, MD 01/31/2019 11:27 AM

## 2019-02-03 ENCOUNTER — Other Ambulatory Visit: Payer: Self-pay

## 2019-02-03 ENCOUNTER — Encounter: Payer: Self-pay | Admitting: Family Medicine

## 2019-02-03 MED ORDER — KETOCONAZOLE 2 % EX SHAM: MEDICATED_SHAMPOO | CUTANEOUS | 0 refills | Status: DC

## 2019-02-25 ENCOUNTER — Encounter: Payer: Medicare Other | Admitting: Adult Health

## 2019-07-22 ENCOUNTER — Other Ambulatory Visit: Payer: Self-pay | Admitting: Family Medicine

## 2019-07-22 MED ORDER — TADALAFIL 10 MG PO TABS: 30.0000 mg | ORAL_TABLET | Freq: Every day | ORAL | 0 refills | Status: DC | PRN

## 2019-07-24 ENCOUNTER — Other Ambulatory Visit: Payer: Self-pay | Admitting: Family Medicine

## 2019-07-26 ENCOUNTER — Encounter: Payer: Self-pay | Admitting: Family Medicine

## 2019-07-27 ENCOUNTER — Encounter: Payer: Self-pay | Admitting: Family Medicine

## 2019-07-28 ENCOUNTER — Encounter: Payer: Self-pay | Admitting: Family Medicine

## 2019-07-30 ENCOUNTER — Encounter: Payer: Self-pay | Admitting: Family Medicine

## 2019-08-13 ENCOUNTER — Ambulatory Visit (INDEPENDENT_AMBULATORY_CARE_PROVIDER_SITE_OTHER): Payer: Medicare Other | Admitting: Family Medicine

## 2019-08-13 ENCOUNTER — Encounter: Payer: Self-pay | Admitting: Family Medicine

## 2019-08-13 DIAGNOSIS — G4700 Insomnia, unspecified: Secondary | ICD-10-CM

## 2019-08-13 MED ORDER — TRAZODONE HCL 50 MG PO TABS: 25.0000 mg | ORAL_TABLET | Freq: Every evening | ORAL | 3 refills | Status: DC | PRN

## 2019-08-13 NOTE — Progress Notes (Signed)
    Chief Complaint:  Kristopher Martinez is a 73 y.o. male who presents today for a virtual office visit with a chief complaint of insomnia.   Assessment/Plan:  Insomnia Discussed treatment options.  Will start trazodone 50 mg nightly.  Follow-up with me in 1 to 2 weeks.  Also discussed stress reduction techniques.  Will be following up with therapy for this.    Subjective:  HPI:  Insomnia   Started a few weeks ago. Is currently undergoing a trial separation from his wife for 7 years which is been a significant stressor.  He is currently been moving from hotel to hotel and is in the process of moving into a furnished apartment.  In the past he has had some difficulty with sleeping is usually taken ibuprofen PM.  Does not want to take this due to concerns of side effects.  He also admits to drinking more than he did previously.  He has since quit drinking he is now attending virtual [place] meetings.  He also has an appointment scheduled with an online therapist later this week.  He has trouble staying asleep.  Sleeping about 4 hours per night.  No other treatments tried. No other obvious alleviating or aggravating factors.   ROS: Per HPI  PMH: He reports that he quit smoking about 42 years ago. He has never used smokeless tobacco. He reports current alcohol use of about 14.0 standard drinks of alcohol per week. He reports that he does not use drugs.      Objective/Observations  Physical Exam: Gen: NAD, resting comfortably Pulm: Normal work of breathing Neuro: Grossly normal, moves all extremities Psych: Normal affect and thought content  Virtual Visit via Video   I connected with [name] on 08/13/19 at  4:00 PM EDT by a video enabled telemedicine application and verified that I am speaking with the correct person using two identifiers. I discussed the limitations of evaluation and management by telemedicine and the availability of in person appointments. The patient expressed  understanding and agreed to proceed.   Patient location: Home Provider location: [place] participating in the virtual visit: Myself and Patient     [name]. Jerline Pain, MD 08/13/2019 4:07 PM

## 2019-08-13 NOTE — Assessment & Plan Note (Signed)
Discussed treatment options.  Will start trazodone 50 mg nightly.  Follow-up with me in 1 to 2 weeks.  Also discussed stress reduction techniques.  Will be following up with therapy for this.

## 2019-08-17 ENCOUNTER — Encounter: Payer: Self-pay | Admitting: Family Medicine

## 2019-08-17 ENCOUNTER — Emergency Department (HOSPITAL_BASED_OUTPATIENT_CLINIC_OR_DEPARTMENT_OTHER)
Admission: EM | Admit: 2019-08-17 | Discharge: 2019-08-17 | Disposition: A | Discharge status: Home / Self Care | Payer: Medicare Other | Attending: Emergency Medicine | Admitting: Emergency Medicine

## 2019-08-17 ENCOUNTER — Encounter (HOSPITAL_BASED_OUTPATIENT_CLINIC_OR_DEPARTMENT_OTHER): Payer: Self-pay | Admitting: *Deleted

## 2019-08-17 ENCOUNTER — Other Ambulatory Visit: Payer: Self-pay

## 2019-08-17 ENCOUNTER — Emergency Department (HOSPITAL_BASED_OUTPATIENT_CLINIC_OR_DEPARTMENT_OTHER): Payer: Medicare Other

## 2019-08-17 ENCOUNTER — Ambulatory Visit (INDEPENDENT_AMBULATORY_CARE_PROVIDER_SITE_OTHER): Payer: Medicare Other | Admitting: Licensed Clinical Social Worker

## 2019-08-17 DIAGNOSIS — F331 Major depressive disorder, recurrent, moderate: Secondary | ICD-10-CM

## 2019-08-17 DIAGNOSIS — Z23 Encounter for immunization: Secondary | ICD-10-CM | POA: Diagnosis not present

## 2019-08-17 DIAGNOSIS — Y9389 Activity, other specified: Secondary | ICD-10-CM | POA: Diagnosis not present

## 2019-08-17 DIAGNOSIS — Z79899 Other long term (current) drug therapy: Secondary | ICD-10-CM | POA: Insufficient documentation

## 2019-08-17 DIAGNOSIS — E785 Hyperlipidemia, unspecified: Secondary | ICD-10-CM | POA: Diagnosis not present

## 2019-08-17 DIAGNOSIS — Z87891 Personal history of nicotine dependence: Secondary | ICD-10-CM | POA: Diagnosis not present

## 2019-08-17 DIAGNOSIS — W010XXA Fall on same level from slipping, tripping and stumbling without subsequent striking against object, initial encounter: Secondary | ICD-10-CM | POA: Diagnosis not present

## 2019-08-17 DIAGNOSIS — I10 Essential (primary) hypertension: Secondary | ICD-10-CM | POA: Diagnosis not present

## 2019-08-17 DIAGNOSIS — F1010 Alcohol abuse, uncomplicated: Secondary | ICD-10-CM | POA: Diagnosis not present

## 2019-08-17 DIAGNOSIS — R55 Syncope and collapse: Secondary | ICD-10-CM | POA: Diagnosis not present

## 2019-08-17 DIAGNOSIS — S0101XA Laceration without foreign body of scalp, initial encounter: Secondary | ICD-10-CM

## 2019-08-17 DIAGNOSIS — Y998 Other external cause status: Secondary | ICD-10-CM | POA: Insufficient documentation

## 2019-08-17 DIAGNOSIS — Y92009 Unspecified place in unspecified non-institutional (private) residence as the place of occurrence of the external cause: Secondary | ICD-10-CM | POA: Insufficient documentation

## 2019-08-17 LAB — CBC WITH DIFFERENTIAL/PLATELET
Abs Immature Granulocytes: 0.03 10*3/uL (ref 0.00–0.07)
Basophils Absolute: 0 10*3/uL (ref 0.0–0.1)
Basophils Relative: 0 %
Eosinophils Absolute: 0 10*3/uL (ref 0.0–0.5)
Eosinophils Relative: 0 %
HCT: 45.5 % (ref 39.0–52.0)
Hemoglobin: 15.7 g/dL (ref 13.0–17.0)
Immature Granulocytes: 0 %
Lymphocytes Relative: 16 %
Lymphs Abs: 1.3 10*3/uL (ref 0.7–4.0)
MCH: 33.1 pg (ref 26.0–34.0)
MCHC: 34.5 g/dL (ref 30.0–36.0)
MCV: 95.8 fL (ref 80.0–100.0)
Monocytes Absolute: 0.7 10*3/uL (ref 0.1–1.0)
Monocytes Relative: 9 %
Neutro Abs: 5.7 10*3/uL (ref 1.7–7.7)
Neutrophils Relative %: 75 %
Platelets: 198 10*3/uL (ref 150–400)
RBC: 4.75 MIL/uL (ref 4.22–5.81)
RDW: 12.3 % (ref 11.5–15.5)
WBC: 7.8 10*3/uL (ref 4.0–10.5)
nRBC: 0 % (ref 0.0–0.2)

## 2019-08-17 LAB — BASIC METABOLIC PANEL
Anion gap: 11 (ref 5–15)
BUN: 20 mg/dL (ref 8–23)
CO2: 23 mmol/L (ref 22–32)
Calcium: 9.1 mg/dL (ref 8.9–10.3)
Chloride: 106 mmol/L (ref 98–111)
Creatinine, Ser: 0.85 mg/dL (ref 0.61–1.24)
GFR calc Af Amer: >60 mL/min (ref >60)
GFR calc non Af Amer: >60 mL/min (ref >60)
Glucose, Bld: 111 mg/dL — ABNORMAL HIGH (ref 70–99)
Potassium: 3.8 mmol/L (ref 3.5–5.1)
Sodium: 140 mmol/L (ref 135–145)

## 2019-08-17 MED ORDER — TETANUS-DIPHTH-ACELL PERTUSSIS 5-2.5-18.5 LF-MCG/0.5 IM SUSP
0.5000 mL | Freq: Once | INTRAMUSCULAR | Status: AC
  Administered 2019-08-17: 0.5 mL via INTRAMUSCULAR
  Filled 2019-08-17: qty 0.5

## 2019-08-17 NOTE — ED Triage Notes (Addendum)
Pt states that he was recently started on trazadone. States he took a dose last night and woke up around 230am and was nauseated and hot. States he went into the bathroom to wash his face and he "passed out" small laceration noted to the posterior aspect of his head. Denies any cp/sob. resp even and unlabored. Denies any dizziness. approx 1.5 inch laceration to left posterior aspect of his head.

## 2019-08-17 NOTE — ED Provider Notes (Addendum)
[facility] EMERGENCY DEPARTMENT Provider Note   CSN: [id number] Arrival date & time: 08/17/19  0510    History   Chief Complaint Chief Complaint  Patient presents with  . Fall    HPI Kristopher Martinez is a 73 y.o. male.     HPI  This is a 73 year old male with a history of hypertension, hyperlipidemia, prior episodes of syncope who presents with a fall.  Patient reports he started trazodone recently for insomnia.  He got up at 2:30 in the morning because he was flushed and hot.  He stood up to go to the bathroom.  He states that the next thing he knew he was on the floor.  He believes he passed out.  He did not have any significant pain.  He went back to sleep and noted that he had blood on the pillow.  He noted a laceration to the left side of his scalp.  Not on any blood thinners.  Denies neck pain or other injury.  He denies any chest pain or shortness of breath.  No significant dizziness prior to losing consciousness.  Patient reports history of syncope in the past which he believes was associated with eating.  He states he was told they were quite sure why.  Unknown last tetanus shot.  I reviewed the patient's chart.  He was recently started on trazodone for insomnia.  I have also noted multiple communications where he has requested help with an alcohol use disorder.  Past Medical History:  Diagnosis Date  . Flu    03/23/14    . Hemorrhoid   . Hyperlipidemia   . IBS (irritable bowel syndrome)   . Low back pain   . Sinusitis    03/23/14  . Syncope 1995   did all testing-post-prandal-pt.states was told had long QT segment    Patient Active Problem List   Diagnosis Date Noted  . Insomnia 08/13/2019  . Eczema 01/30/2019  . Seborrheic dermatitis 01/30/2019  . Erectile dysfunction 01/30/2019  . Dyslipidemia 09/29/2007    Past Surgical History:  Procedure Laterality Date  . APPENDECTOMY    . COLONOSCOPY  2008   HYPERPLASTIC POLYPS X 8   . EYE SURGERY     strabismus as child  . HYPERPLASIA TISSUE EXCISION     atypical lymphoid   . INGUINAL HERNIA REPAIR Right 04/08/2014   Procedure: LAPAROSCOPIC RIGHT INGUINAL HERNIA REPAIR;  Surgeon: Gayland Curry, MD;  Location: [facility];  Service: General;  Laterality: Right;  . INSERTION OF MESH Right 04/08/2014   Procedure: INSERTION OF MESH;  Surgeon: Gayland Curry, MD;  Location: [facility];  Service: General;  Laterality: Right;  . TONSILLECTOMY    . WISDOM TOOTH EXTRACTION          Home Medications    Prior to Admission medications   Medication Sig Start Date End Date Taking? Authorizing Provider  amoxicillin (AMOXIL) 500 MG capsule TAKE 1 CAPSULE 3 TIMES PER DAY UNTIL GONE 10/29/18   [provider]  ibuprofen (ADVIL,MOTRIN) 200 MG tablet Take 200 mg by mouth every 6 (six) hours as needed for mild pain.    [provider]  Ibuprofen-Diphenhydramine Cit (ADVIL PM PO) Take 1 tablet by mouth at bedtime.    [provider]  ketoconazole (NIZORAL) 2 % shampoo Apply once daily for 5-10 minutes for 1 week, then twice weekly as needed 02/03/19   Vivi Barrack, MD  OVER THE COUNTER MEDICATION Take 1 tablet by  mouth at bedtime. POWER TO SLEEP=MELATONIN    [provider]  tadalafil (CIALIS) 10 MG tablet Take 3 tablets (30 mg total) by mouth daily as needed for erectile dysfunction. 07/22/19   Vivi Barrack, MD  traZODone (DESYREL) 50 MG tablet Take 0.5-1 tablets (25-50 mg total) by mouth at bedtime as needed for sleep. 08/13/19   Vivi Barrack, MD  triamcinolone cream (KENALOG) 0.5 % Apply 1 application topically 3 (three) times daily. 01/30/19   Vivi Barrack, MD    Family History Family History  Problem Relation Age of Onset  . Colitis Mother   . Alcohol abuse Mother   . Cancer Father        prostate cancer  . Prostate cancer Father   . Prostate cancer Other   . Colon cancer Neg Hx     Social History Social History   Tobacco Use  . Smoking status: Former  Smoker    Quit date: 12/31/1976    Years since quitting: 42.6  . Smokeless tobacco: Never Used  Substance Use Topics  . Alcohol use: Yes    Alcohol/week: 14.0 standard drinks    Types: 14 Glasses of wine per week    Comment: 1-2 GLASSES wine daily per pt  . Drug use: No     Allergies   Augmentin [amoxicillin-pot clavulanate] and Cleocin [clindamycin hcl]   Review of Systems Review of Systems  Constitutional: Negative for fever.  Respiratory: Negative for shortness of breath.   Cardiovascular: Negative for chest pain.  Gastrointestinal: Negative for abdominal pain, nausea and vomiting.  Genitourinary: Negative for dysuria.  Skin: Positive for wound.  Neurological: Positive for syncope. Negative for dizziness and headaches.  All other systems reviewed and are negative.    Physical Exam Updated Vital Signs BP 132/75 (BP Location: Right Arm)   Pulse 74   Temp 97.9 F (36.6 C) (Oral)   Resp 16   Ht 1.778 m (5\' 10" )   Wt 73.9 kg   SpO2 98%   BMI 23.39 kg/m   Physical Exam Vitals signs and nursing note reviewed.  Constitutional:      Appearance: He is well-developed. He is not ill-appearing.  HENT:     Head: Normocephalic.     Comments: 5 cm laceration of the left side of the scalp, non-gaping, bleeding controlled    Mouth/Throat:     Mouth: Mucous membranes are moist.  Eyes:     Extraocular Movements: Extraocular movements intact.     Pupils: Pupils are equal, round, and reactive to light.  Neck:     Musculoskeletal: Neck supple.     Comments: No tenderness to palpation, step-off, deformity Cardiovascular:     Rate and Rhythm: Normal rate and regular rhythm.     Heart sounds: Normal heart sounds. No murmur.  Pulmonary:     Effort: Pulmonary effort is normal. No respiratory distress.     Breath sounds: Normal breath sounds. No wheezing.  Abdominal:     General: Bowel sounds are normal.     Palpations: Abdomen is soft.     Tenderness: There is no abdominal  tenderness. There is no rebound.  Musculoskeletal:     Right lower leg: No edema.     Left lower leg: No edema.  Lymphadenopathy:     Cervical: No cervical adenopathy.  Skin:    General: Skin is warm and dry.  Neurological:     Mental Status: He is alert and oriented to person, place, and time.  Comments: Fluent speech, cranial nerves II through XII intact, normal gait  Psychiatric:        Mood and Affect: Mood normal.      ED Treatments / Results  Labs (all labs ordered are listed, but only abnormal results are displayed) Labs Reviewed  BASIC METABOLIC PANEL - Abnormal; Notable for the following components:      Result Value   Glucose, Bld 111 (*)    All other components within normal limits  CBC WITH DIFFERENTIAL/PLATELET    EKG EKG Interpretation  Date/Time:  Monday August 17 2019 05:42:00 EDT Ventricular Rate:  68 PR Interval:    QRS Duration: 107 QT Interval:  408 QTC Calculation: 434 R Axis:   82 Text Interpretation:  Sinus rhythm Consider right ventricular hypertrophy Confirmed by Thayer Jew [phone]) on 08/17/2019 5:47:20 AM   Radiology Ct Head Wo Contrast  Result Date: 08/17/2019 CLINICAL DATA:  Minor head trauma with high clinical risk EXAM: CT HEAD WITHOUT CONTRAST TECHNIQUE: Contiguous axial images were obtained from the base of the skull through the vertex without intravenous contrast. COMPARISON:  None available FINDINGS: Brain: No evidence of acute infarction, hemorrhage, hydrocephalus, extra-axial collection or mass lesion/mass effect. Vascular: No hyperdense vessel or unexpected calcification. Skull: Normal. Negative for fracture or focal lesion. Sinuses/Orbits: No evidence of injury IMPRESSION: No evidence of intracranial injury. Electronically Signed   By: Monte Fantasia M.D.   On: 08/17/2019 06:05    Procedures .Marland KitchenLaceration Repair  Date/Time: 08/17/2019 6:17 AM Performed by: Merryl Hacker, MD Authorized by: Merryl Hacker, MD    Consent:    Consent obtained:  Verbal   Consent given by:  Patient   Risks discussed:  Infection, pain and poor cosmetic result   Alternatives discussed:  No treatment Anesthesia (see MAR for exact dosages):    Anesthesia method:  None Laceration details:    Location:  Scalp   Scalp location:  L parietal   Length (cm):  6   Depth (mm):  2 Repair type:    Repair type:  Simple Exploration:    Wound extent: no areolar tissue violation noted, no fascia violation noted, no foreign bodies/material noted, no muscle damage noted, no nerve damage noted, no tendon damage noted, no underlying fracture noted and no vascular damage noted     Contaminated: no   Treatment:    Area cleansed with:  Betadine   Amount of cleaning:  Standard   Irrigation solution:  Sterile saline   Visualized foreign bodies/material removed: no   Skin repair:    Repair method:  Staples   Number of staples:  2 Post-procedure details:    Dressing:  Open (no dressing)   Patient tolerance of procedure:  Tolerated well, no immediate complications   (including critical care time)  Medications Ordered in ED Medications  Tdap (BOOSTRIX) injection 0.5 mL (0.5 mLs Intramuscular Given 08/17/19 0543)     Initial Impression / Assessment and Plan / ED Course  I have reviewed the triage vital signs and the nursing notes.  Pertinent labs & imaging results that were available during my care of the patient were reviewed by me and considered in my medical decision making (see chart for details).        Patient presents after a fall.  Reports loss of consciousness.  He is overall nontoxic-appearing and vital signs are reassuring.  Reports a history of syncope of unknown origin.  Question long QT syndrome deviously.  His EKG today does not show  any evidence of prolonged QT.  No other arrhythmia noted.  Lab work obtained and does not show any significant metabolic derangements.  He is neurologically intact.  He received high  risk per [place] CT head negative.  CT head shows no evidence of cranial bleed.  Laceration was repaired as above.  I only used 2 staples to better approximated as it is not significantly gaping or deep that was oozing wound.  Recommend removal in 10 days.  Given that syncope diagnosis is not new, do not feel he needs admission.  However, we will have him follow-up with his primary physician.  It could be medication related given that he just started trazodone.  After history, exam, and medical workup I feel the patient has been appropriately medically screened and is safe for discharge home. Pertinent diagnoses were discussed with the patient. Patient was given return precautions.   Final Clinical Impressions(s) / ED Diagnoses   Final diagnoses:  Laceration of scalp, initial encounter  Syncope, unspecified syncope type    ED Discharge Orders    None       Maryna Yeagle, Barbette Hair, MD 08/17/19 3612    Merryl Hacker, MD 08/17/19 [phone]

## 2019-08-17 NOTE — Progress Notes (Signed)
Virtual Visit via Video Note  I connected with Kristopher Martinez on 08/17/19 at 10:00 AM EDT by a video enabled telemedicine application and verified that I am speaking with the correct person using two identifiers.  Location: Patient: Home Provider: Office   I discussed the limitations of evaluation and management by telemedicine and the availability of in person appointments. The patient expressed understanding and agreed to proceed.    I discussed the assessment and treatment plan with the patient. The patient was provided an opportunity to ask questions and all were answered. The patient agreed with the plan and demonstrated an understanding of the instructions.   The patient was advised to call back or seek an in-person evaluation if the symptoms worsen or if the condition fails to improve as anticipated.  I provided 55 minutes of non-face-to-face time during this encounter.   Archie Balboa, LCAS-A    Comprehensive Clinical Assessment (CCA) Note  08/17/2019 Kristopher Martinez [id number]  Visit Diagnosis:      ICD-10-CM   1. MDD (major depressive disorder), recurrent episode, moderate (HCC)  F33.1   2. Alcohol use disorder, mild, abuse  F10.10       CCA Part One  Part One has been completed on paper by the patient.  (See scanned document in Chart Review)  CCA Part Two A  Intake/Chief Complaint:  CCA Intake With Chief Complaint CCA Part Two Date: 08/17/19 CCA Part Two Time: 7591 Chief Complaint/Presenting Problem: "My wife of 78 yrs and I are doing a trial separation. I've been hiding my drinking from her for the past 6 months and she found out recently. I haven't drank since end of July." Patients Currently Reported Symptoms/Problems: Tearfulness, hopelessness, worry, family conflict, isolation, high stress Individual's Strengths: Hx of successful career and marriage Individual's Preferences: Wants to send journal entries to counselor Individual's Abilities:  Journaling Type of Services Patient Feels Are Needed: Individual counseling then "maybe couples cousneling"  Mental Health Symptoms Depression:  Depression: Change in energy/activity, Difficulty Concentrating, Hopelessness, Irritability, Tearfulness, Worthlessness, Sleep (too much or little)  Mania:     Anxiety:   Anxiety: Tension, Worrying, Restlessness, Irritability  Psychosis:     Trauma:     Obsessions:     Compulsions:     Inattention:     Hyperactivity/Impulsivity:     Oppositional/Defiant Behaviors:     Borderline Personality:     Other Mood/Personality Symptoms:      Mental Status Exam Appearance and self-care  Stature:  Stature: Average  Weight:  Weight: Average weight  Clothing:  Clothing: Neat/clean  Grooming:  Grooming: Normal  Cosmetic use:  Cosmetic Use: None  Posture/gait:  Posture/Gait: Normal  Motor activity:  Motor Activity: Not Remarkable  Sensorium  Attention:  Attention: Normal  Concentration:  Concentration: Normal  Orientation:  Orientation: X5  Recall/memory:  Recall/Memory: Normal  Affect and Mood  Affect:  Affect: Tearful  Mood:  Mood: Euthymic  Relating  Eye contact:  Eye Contact: Normal  Facial expression:  Facial Expression: Responsive  Attitude toward examiner:  Attitude Toward Examiner: Cooperative  Thought and Language  Speech flow: Speech Flow: Normal  Thought content:  Thought Content: Appropriate to mood and circumstances  Preoccupation:     Hallucinations:     Organization:     Transport planner of Knowledge:  Fund of Knowledge: Average  Intelligence:  Intelligence: Average  Abstraction:  Abstraction: Normal  Judgement:  Judgement: Common-sensical  Reality Testing:  Pension scheme manager  Insight:  Insight: Good  Decision Making:  Decision Making: Normal, Confused  Social Functioning  Social Maturity:  Social Maturity: Responsible  Social Judgement:  Social Judgement: Normal  Stress  Stressors:  Stressors: Family  conflict, Transitions  Coping Ability:  Coping Ability: Deficient supports  Skill Deficits:     Supports:      Family and Psychosocial History: Family history Marital status: Separated Separated, when?: July 2020 What types of issues is patient dealing with in the relationship?: "Wife recently discovered I had been lying and hiding my drinking" What is your sexual orientation?: Heterosexual Does patient have children?: No(by choice)  Childhood History:  Childhood History By whom was/is the patient raised?: Both parents Description of patient's relationship with caregiver when they were a child: My mother was very controlling and my father was not very involved w/ me. 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 time patient has a held a job?: 20 years at [employer]  Education: Education Did Teacher, adult education From [employer]?: Yes Did Physicist, medical?: Yes What Type of College Degree Do you Have?: BA  Religion: Religion/Spirituality Are You A Religious Person?: No How Might This Affect Treatment?: "I've had trouble w/ the 'God Thing' in [place] so far"  Leisure/Recreation: Leisure / Recreation Leisure and Hobbies: Reading, Research officer, trade union, dogs  Exercise/Diet: Exercise/Diet Do You Exercise?: Yes What Type of Exercise Do You Do?: Run/Walk How Many Times a Week Do You Exercise?: 1-3 times a week Have You Gained or Lost A Significant Amount of  Weight in the Past Six Months?: No Do You Follow a Special Diet?: No Do You Have Any Trouble Sleeping?: Yes Explanation of Sleeping Difficulties: Recently tried Trazadone, did not have a good experience  CCA Part Two C  Alcohol/Drug Use: Alcohol / Drug Use History of alcohol / drug use?: Yes Negative Consequences of Use: Personal relationships Substance #1 Name of Substance 1: Alcohol 1 - Age of First Use: 18 1 - Amount (size/oz): 1 bottle wine 1 - Frequency: near daily 1 - Duration: last 1 year 1 - Last Use / Amount: July 2020                    CCA Part Three  ASAM's:  Six Dimensions of Multidimensional Assessment  Dimension 1:  Acute Intoxication and/or Withdrawal Potential:     Dimension 2:  Biomedical Conditions and Complications:     Dimension 3:  Emotional, Behavioral, or Cognitive Conditions and Complications:     Dimension 4:  Readiness to Change:     Dimension 5:  Relapse, Continued use, or Continued Problem Potential:     Dimension 6:  Recovery/Living Environment:      Substance use Disorder (SUD) Substance Use Disorder (SUD)  Checklist Symptoms of Substance Use: Recurrent use that results in a fialure to fulfill major rule obligatinos (work, school, home), Substance(s) often taken in large amounts or over longer times than was intended, Presence of craving or strong urge to use, Continued use despite having a persistent/recurrent  physical/psychological problem caused/exacerbated by use, Continued use despite persistent or recurrent social, interpersonal problems, caused or exacerbated by use  Social Function:  Social Functioning Social Maturity: Responsible Social Judgement: Normal  Stress:  Stress Stressors: Family conflict, Transitions Coping Ability: Deficient supports Patient Takes Medications The Way The Doctor Instructed?: Yes Priority Risk: Low Acuity  Risk Assessment- Self-Harm Potential: Risk Assessment For Self-Harm Potential Thoughts of  Self-Harm: No current thoughts Method: No plan  Risk Assessment -Dangerous to Others Potential: Risk Assessment For Dangerous to Others Potential Method: No Plan  DSM5 Diagnoses: Patient Active Problem List   Diagnosis Date Noted  . Insomnia 08/13/2019  . Eczema 01/30/2019  . Seborrheic dermatitis 01/30/2019  . Erectile dysfunction 01/30/2019  . Dyslipidemia 09/29/2007    Patient Centered Plan: Patient is on the following Treatment Plan(s):  Depression  Recommendations for Services/Supports/Treatments: Recommendations for Services/Supports/Treatments Recommendations For Services/Supports/Treatments: Individual Therapy  Treatment Plan Summary: OP Treatment Plan Summary: PT wants help w/ handling transitions in life  Referrals to Alternative Service(s): Referred to Alternative Service(s):   Place:   Date:   Time:    Referred to Alternative Service(s):   Place:   Date:   Time:    Referred to Alternative Service(s):   Place:   Date:   Time:    Referred to Alternative Service(s):   Place:   Date:   Time:     Archie Balboa

## 2019-08-17 NOTE — Discharge Instructions (Addendum)
You were seen today after an episode of passing out and hitting her head.  Your laceration was repaired.  You need to have staples removed in 10 days either here or at your primary doctor's office.  Your lab work-up and EKG is reassuring.  Your episode of passing out may be related to the new medication.  Discussed with your doctor whether you should continue this medicine.

## 2019-08-17 NOTE — ED Notes (Signed)
To CT

## 2019-08-17 NOTE — ED Notes (Signed)
Return from CT

## 2019-08-18 ENCOUNTER — Encounter: Payer: Self-pay | Admitting: Family Medicine

## 2019-08-19 ENCOUNTER — Other Ambulatory Visit: Payer: Self-pay

## 2019-08-19 MED ORDER — HYDROXYZINE HCL 25 MG PO TABS: 25.0000 mg | ORAL_TABLET | Freq: Every evening | ORAL | 0 refills | Status: DC | PRN

## 2019-08-27 ENCOUNTER — Ambulatory Visit (INDEPENDENT_AMBULATORY_CARE_PROVIDER_SITE_OTHER): Payer: Medicare Other | Admitting: Family Medicine

## 2019-08-27 ENCOUNTER — Other Ambulatory Visit: Payer: Self-pay

## 2019-08-27 ENCOUNTER — Encounter: Payer: Self-pay | Admitting: Family Medicine

## 2019-08-27 VITALS — Temp 97.6°F | Ht 70.0 in | Wt 157.0 lb

## 2019-08-27 DIAGNOSIS — F439 Reaction to severe stress, unspecified: Secondary | ICD-10-CM

## 2019-08-27 DIAGNOSIS — G4700 Insomnia, unspecified: Secondary | ICD-10-CM | POA: Diagnosis not present

## 2019-08-27 DIAGNOSIS — S0101XD Laceration without foreign body of scalp, subsequent encounter: Secondary | ICD-10-CM | POA: Diagnosis not present

## 2019-08-27 DIAGNOSIS — Z23 Encounter for immunization: Secondary | ICD-10-CM

## 2019-08-27 MED ORDER — HYDROXYZINE HCL 25 MG PO TABS: 12.5000 mg | ORAL_TABLET | Freq: Every evening | ORAL | 0 refills | Status: DC | PRN

## 2019-08-27 NOTE — Patient Instructions (Signed)
It was very nice to see you today!  We removed her staples today.  It is okay for you to wash this area.  I am glad the hydroxyzine is working for you.  Please continue using this.  Let me know if you need any resources to help with stress management.   Take care, Dr Jerline Pain  Please try these tips to maintain a healthy lifestyle:   Eat at least 3 REAL meals and 1-2 snacks per day.  Aim for no more than 5 hours between eating.  If you eat breakfast, please do so within one hour of getting up.    Obtain twice as many fruits/vegetables as protein or carbohydrate foods for both lunch and dinner. (Half of each meal should be fruits/vegetables, one quarter protein, and one quarter starchy carbs)   Cut down on sweet beverages. This includes juice, soda, and sweet tea.    Exercise at least 150 minutes every week.

## 2019-08-27 NOTE — Assessment & Plan Note (Signed)
Continue hydroxyzine 12.5 mg nightly.

## 2019-08-27 NOTE — Progress Notes (Signed)
   Chief Complaint:  Kristopher Martinez is a 73 y.o. male who presents for same day appointment with a chief complaint of scalp laceration.   Assessment/Plan:  Scalp Laceration Staples removed today without difficulty.  He tolerated well.  He can proceed with norml hygiene. Discussed reasons to return to care.  Stress Continue seeing Conservation officer, nature.  Call for referral or starting medications however patient declined.  Insomnia Continue hydroxyzine 12.5 mg nightly.  Preventative Healthcare Flu vaccine given today.      Subjective:  HPI:  Patient seen about 2 weeks ago and started on trazodone for insomnia.shortly after stopping the medication, he had an episode of flushing and passed out in the middle of the night. Was seen in the ED and was found to have a scalp laceration that was repaired with staples.  He stopped the trazodone and has not had any repeat episodes since then.  He is now taking hydroxyzine 12.5 mg nightly as needed and doing well with this.  He is currently undergoing a trial separation with his wife and has been under increased stress.  Has been taking only culture was seems to be helping.  He would like for his scalp staples to be removed today.  Hydroxyzine seems to be helping with his sleep.  Has not noticed any other obvious alleviating or aggravating factors.  ROS: Per HPI  PMH: He reports that he quit smoking about 42 years ago. He has never used smokeless tobacco. He reports current alcohol use of about 14.0 standard drinks of alcohol per week. He reports that he does not use drugs.      Objective:  Physical Exam: Temp 97.6 F (36.4 C)   Ht 5\' 10"  (1.778 m)   Wt 157 lb (71.2 kg)   SpO2 98%   BMI 22.53 kg/m   Wt Readings from Last 3 Encounters:  08/27/19 157 lb (71.2 kg)  08/17/19 163 lb (73.9 kg)  01/30/19 167 lb 12 oz (76.1 kg)  Gen: NAD, resting comfortably CV: Regular rate and rhythm with no murmurs appreciated Pulm: Normal work of breathing,  clear to auscultation bilaterally with no crackles, wheezes, or rhonchi Scalp: Approximately 2 cm well-healed laceration on left parietal scalp.  2 staples in place   Both of his staples were removed today with staple removal tool without difficulty.  He tolerated well.      [name]. Jerline Pain, MD 08/27/2019 9:25 AM

## 2019-08-31 ENCOUNTER — Ambulatory Visit (INDEPENDENT_AMBULATORY_CARE_PROVIDER_SITE_OTHER): Payer: Medicare Other | Admitting: Licensed Clinical Social Worker

## 2019-08-31 ENCOUNTER — Encounter (HOSPITAL_COMMUNITY): Payer: Self-pay | Admitting: Licensed Clinical Social Worker

## 2019-08-31 DIAGNOSIS — F331 Major depressive disorder, recurrent, moderate: Secondary | ICD-10-CM

## 2019-08-31 DIAGNOSIS — F1010 Alcohol abuse, uncomplicated: Secondary | ICD-10-CM | POA: Diagnosis not present

## 2019-08-31 NOTE — Progress Notes (Signed)
Virtual Visit via Video Note  I connected with Kristopher Martinez on 08/31/19 at  2:30 PM EDT by a video enabled telemedicine application and verified that I am speaking with the correct person using two identifiers.  Location: Patient: Home Provider: Office   I discussed the limitations of evaluation and management by telemedicine and the availability of in person appointments. The patient expressed understanding and agreed to proceed.    I discussed the assessment and treatment plan with the patient. The patient was provided an opportunity to ask questions and all were answered. The patient agreed with the plan and demonstrated an understanding of the instructions.   The patient was advised to call back or seek an in-person evaluation if the symptoms worsen or if the condition fails to improve as anticipated.  I provided 55 minutes of non-face-to-face time during this encounter.   Archie Balboa, LCAS-A    THERAPIST PROGRESS NOTE  Session Time: 2:30-3:30  Participation Level: Active  Behavioral Response: Well GroomedAlertEuthymic  Type of Therapy: Individual Therapy  Treatment Goals addressed: Anger and Anxiety  Interventions: Biofeedback, Supportive and Other: Awareness of Feelings  Summary: Kristopher Martinez is a 73 y.o. male who presents with recent hx of mild alcohol abuse and anxiety w/ mixed mood disorder. He reports he continues to stay in a hotel and separate from his wife of 40 years. He is communicating w/ her through text and email and their tone is civil, concerned, though somewhat distant. Pt continues to present as talkative, intellectualizing, and has tearful affect later in session. He admits that "perhaps he is bothered by the success of his wife". He reports on his hx of feeling uncomfortable when attending parties that are "w/ his wife's crowd" because he "does not know what to talk about". PT feels the opposite when he is in charge of his own meeting or w/ his "own  crowd". PT states his wife has told him on multiple occasions that he has "unresolved mother issues". We discuss the likelihood of this and implications. PT reports on his memories of his mother being dominant and annoying. He regrets his father's car accident which rendered his father a "lesser version of himself" and possibly prevented PT from getting as close to father as he would have liked. PT admits he has resentment towards his mother for getting in the way of having a closer relationship to his father.   I recommend PT look for SMART recovery meetings in the area since he states he is having trouble accepting Step 2 in AA.   PT reports he had one beer after our last session and it made him feel "awful emotionally" so he has not had another.   Suicidal/Homicidal: Nowithout intent/plan  Therapist Response: I used open questions, active listening, and reflection. I emphasize somatic understanding and processing to help PT better relate to his emotional self. I use term "inner child" to describe PT's triggered feeling around his wife.   Plan: Return again in 2 weeks.  Diagnosis:    ICD-10-CM   1. Alcohol use disorder, mild, abuse  F10.10   2. MDD (major depressive disorder), recurrent episode, moderate ([place])  F33.1       Archie Balboa, LCAS-A 08/31/2019

## 2019-09-17 ENCOUNTER — Ambulatory Visit (INDEPENDENT_AMBULATORY_CARE_PROVIDER_SITE_OTHER): Payer: Medicare Other | Admitting: Licensed Clinical Social Worker

## 2019-09-17 ENCOUNTER — Other Ambulatory Visit: Payer: Self-pay

## 2019-09-17 DIAGNOSIS — F331 Major depressive disorder, recurrent, moderate: Secondary | ICD-10-CM | POA: Diagnosis not present

## 2019-09-21 ENCOUNTER — Encounter (HOSPITAL_COMMUNITY): Payer: Self-pay | Admitting: Licensed Clinical Social Worker

## 2019-09-21 NOTE — Progress Notes (Signed)
Virtual Visit via Video Note  I connected with Kristopher Martinez on 09/17/19 at 10:00 AM EDT by a video enabled telemedicine application and verified that I am speaking with the correct person using two identifiers.  Location: Patient: Home Provider: Office   I discussed the limitations of evaluation and management by telemedicine and the availability of in person appointments. The patient expressed understanding and agreed to proceed.     I discussed the assessment and treatment plan with the patient. The patient was provided an opportunity to ask questions and all were answered. The patient agreed with the plan and demonstrated an understanding of the instructions.   The patient was advised to call back or seek an in-person evaluation if the symptoms worsen or if the condition fails to improve as anticipated.  I provided 55 minutes of non-face-to-face time during this encounter.   Archie Balboa, LCAS-A    THERAPIST PROGRESS NOTE  Session Time: 11-12  Participation Level: Active  Behavioral Response: Well GroomedAlertDepressed  Type of Therapy: Individual Therapy  Treatment Goals addressed: Anger and Coping  Interventions: CBT, Biofeedback, Supportive and Reframing  Summary: Kristopher Martinez is a 73 y.o. male who presents with hx of depression and mild Alcohol Use Disorder. He reports he continues to experience daily tearfulness and low mood. He continues to journal about his experience of being separated from his wife of 34 years. He does not discuss alcohol consumption since last visit. PT and I discuss a journal entry he sent me to review regarding early adverse childhood experiences. PT felt disgusted by his father's condition following his accident. PT became more self reliant and learned to shut down his own needs in lue of making others happy. PT wants to get back together with his wife but does not feel it is the right time.  Suicidal/Homicidal: Nowithout  intent/plan  Therapist Response: I used open questions, active listening, and emotional reflection to help PT deepen his experience while in session. I asked PT to comment on the biofeedback he was getting in session. I helped PT reframe his experience of early adversity in childhood.   Plan: Return again in 2 weeks.  Diagnosis:    ICD-10-CM   1. MDD (major depressive disorder), recurrent episode, moderate ([place])  F33.1       Archie Balboa, LCAS-A 09/21/2019

## 2019-09-22 ENCOUNTER — Other Ambulatory Visit: Payer: Self-pay | Admitting: Family Medicine

## 2019-09-28 ENCOUNTER — Ambulatory Visit: Payer: Medicare Other

## 2019-09-28 ENCOUNTER — Other Ambulatory Visit: Payer: Self-pay

## 2019-09-29 ENCOUNTER — Other Ambulatory Visit: Payer: Self-pay

## 2019-09-29 ENCOUNTER — Encounter (HOSPITAL_COMMUNITY): Payer: Self-pay | Admitting: Licensed Clinical Social Worker

## 2019-09-29 ENCOUNTER — Ambulatory Visit (INDEPENDENT_AMBULATORY_CARE_PROVIDER_SITE_OTHER): Payer: Medicare Other | Admitting: Licensed Clinical Social Worker

## 2019-09-29 DIAGNOSIS — F331 Major depressive disorder, recurrent, moderate: Secondary | ICD-10-CM | POA: Diagnosis not present

## 2019-09-29 NOTE — Progress Notes (Signed)
Virtual Visit via Video Note  I connected with Kristopher Martinez on 09/29/19 at 12:30 PM EDT by a video enabled telemedicine application and verified that I am speaking with the correct person using two identifiers.  Location: Patient: Home Provider: Office   I discussed the limitations of evaluation and management by telemedicine and the availability of in person appointments. The patient expressed understanding and agreed to proceed.    I discussed the assessment and treatment plan with the patient. The patient was provided an opportunity to ask questions and all were answered. The patient agreed with the plan and demonstrated an understanding of the instructions.   The patient was advised to call back or seek an in-person evaluation if the symptoms worsen or if the condition fails to improve as anticipated.  I provided 52 minutes of non-face-to-face time during this encounter.   Archie Balboa, LCAS-A    THERAPIST PROGRESS NOTE  Session Time: 12:30-1:30  Participation Level: Active  Behavioral Response: Well GroomedAlertEuthymic  Type of Therapy: Individual Therapy  Treatment Goals addressed: Coping  Interventions: CBT  Summary: Kristopher Martinez is a 73 y.o. male who presents with hx of Alcohol Use Disorder, Mild, in remission, and MDD. He is active, engaged, lucid, denies any alcohol use since last session. He reports he has been having more honest conversations w/ his male friends about his current separation and recent hx of alcohol abuse. He states this has been a difficult but rewarding experience since he is feeling more connected to his friends. We discuss PT's plan for attempting to pursue couples counseling w/ his wife. PT reports he wants to meet once more since he feels his therapy w/ me is winding down and he would rather commit to couples counseling.   Suicidal/Homicidal: Nowithout intent/plan  Therapist Response: I used open questions, active listening, and  reframing to help PT gain insight into his depression and self identity. I recommended attending a seminar at the "[employer]".  Plan: Return again in 2 weeks.  Diagnosis:    ICD-10-CM   1. MDD (major depressive disorder), recurrent episode, moderate ([place])  F33.1        Archie Balboa, LCAS-A 09/29/2019

## 2019-10-12 ENCOUNTER — Ambulatory Visit (INDEPENDENT_AMBULATORY_CARE_PROVIDER_SITE_OTHER): Payer: Medicare Other | Admitting: Licensed Clinical Social Worker

## 2019-10-12 ENCOUNTER — Encounter (HOSPITAL_COMMUNITY): Payer: Self-pay | Admitting: Licensed Clinical Social Worker

## 2019-10-12 DIAGNOSIS — F331 Major depressive disorder, recurrent, moderate: Secondary | ICD-10-CM

## 2019-10-12 NOTE — Progress Notes (Signed)
Virtual Visit via Video Note  I connected with Kristopher Martinez on 10/12/19 at  8:00 AM EDT by a video enabled telemedicine application and verified that I am speaking with the correct person using two identifiers.  Location: Patient: Home Provider: Office   I discussed the limitations of evaluation and management by telemedicine and the availability of in person appointments. The patient expressed understanding and agreed to proceed.    I discussed the assessment and treatment plan with the patient. The patient was provided an opportunity to ask questions and all were answered. The patient agreed with the plan and demonstrated an understanding of the instructions.   The patient was advised to call back or seek an in-person evaluation if the symptoms worsen or if the condition fails to improve as anticipated.  I provided 55 minutes of non-face-to-face time during this encounter.   Archie Balboa, LCAS-A    THERAPIST PROGRESS NOTE  Session Time: 8-9  Participation Level: Active  Behavioral Response: Well GroomedAlertEuthymic  Type of Therapy: Individual Therapy  Treatment Goals addressed: Coping  Interventions: CBT and Supportive  Summary: Kristopher Martinez is a 73 y.o. male who presents with Depression. He reports he continues to volunteer often, is taking on leadership roles w/ volunteering organizations, and speaking more openly about his feelings w/ his male friends. He tried to talk to his spouse about starting couples counseling but "she states she is not ready". I spent time encouraging PT and ultimately recommended he seek life coaching from [employer]. PT has signed up for a 7 week course on improving mental health through [employer].  Suicidal/Homicidal: Nowithout intent/plan  Therapist Response: I used open questions, active listening, and reflection. I offered supportive feedback, Socratic questioning, and directed PT to various resources for resolving anxiety and  depression.   Plan: F/U as needed. Seek Life Coaching from [employer].  Diagnosis:    ICD-10-CM   1. MDD (major depressive disorder), recurrent episode, moderate ([place])  F33.1       Archie Balboa, LCAS-A 10/12/2019

## 2019-10-16 ENCOUNTER — Other Ambulatory Visit: Payer: Self-pay

## 2019-10-16 MED ORDER — HYDROXYZINE HCL 25 MG PO TABS: ORAL_TABLET | ORAL | 0 refills | Status: DC

## 2019-10-26 ENCOUNTER — Other Ambulatory Visit: Payer: Self-pay | Admitting: Family Medicine

## 2019-10-26 MED ORDER — KETOCONAZOLE 2 % EX SHAM: MEDICATED_SHAMPOO | CUTANEOUS | 0 refills | Status: DC

## 2019-11-10 ENCOUNTER — Other Ambulatory Visit: Payer: Self-pay | Admitting: Family Medicine

## 2019-11-24 DIAGNOSIS — H18413 Arcus senilis, bilateral: Secondary | ICD-10-CM | POA: Diagnosis not present

## 2019-12-13 ENCOUNTER — Encounter: Payer: Self-pay | Admitting: Family Medicine

## 2019-12-20 ENCOUNTER — Encounter: Payer: Self-pay | Admitting: Family Medicine

## 2020-01-23 ENCOUNTER — Other Ambulatory Visit: Payer: Self-pay | Admitting: Family Medicine

## 2020-01-25 MED ORDER — KETOCONAZOLE 2 % EX SHAM: MEDICATED_SHAMPOO | CUTANEOUS | 0 refills | Status: DC

## 2020-02-01 ENCOUNTER — Other Ambulatory Visit: Payer: Self-pay

## 2020-02-01 MED ORDER — HYDROXYZINE HCL 25 MG PO TABS: ORAL_TABLET | ORAL | 2 refills | Status: DC

## 2020-02-17 ENCOUNTER — Other Ambulatory Visit: Payer: Self-pay | Admitting: Family Medicine

## 2020-04-08 ENCOUNTER — Telehealth: Payer: Self-pay | Admitting: Family Medicine

## 2020-04-08 NOTE — Telephone Encounter (Signed)
Left message for patient to call back and schedule Medicare Annual Wellness Visit (AWV) either virtually/audio only OR in office. Whatever the patients preference is.  Last AWV 11/18/17; please schedule at anytime with [facility]-Nurse Health Advisor at [facility].

## 2020-05-20 ENCOUNTER — Encounter: Payer: Self-pay | Admitting: Family Medicine

## 2020-05-20 NOTE — Telephone Encounter (Signed)
Please advise 

## 2020-05-24 ENCOUNTER — Encounter: Payer: Self-pay | Admitting: Family Medicine

## 2020-06-02 ENCOUNTER — Other Ambulatory Visit: Payer: Self-pay | Admitting: Family Medicine

## 2020-06-03 ENCOUNTER — Ambulatory Visit (INDEPENDENT_AMBULATORY_CARE_PROVIDER_SITE_OTHER): Payer: Medicare Other | Admitting: Family Medicine

## 2020-06-03 ENCOUNTER — Other Ambulatory Visit: Payer: Self-pay

## 2020-06-03 ENCOUNTER — Encounter: Payer: Self-pay | Admitting: Family Medicine

## 2020-06-03 ENCOUNTER — Other Ambulatory Visit: Payer: Self-pay | Admitting: *Deleted

## 2020-06-03 VITALS — BP 122/74 | HR 81 | Temp 97.7°F | Ht 70.0 in | Wt 177.4 lb

## 2020-06-03 DIAGNOSIS — M1990 Unspecified osteoarthritis, unspecified site: Secondary | ICD-10-CM | POA: Diagnosis not present

## 2020-06-03 DIAGNOSIS — L219 Seborrheic dermatitis, unspecified: Secondary | ICD-10-CM | POA: Diagnosis not present

## 2020-06-03 DIAGNOSIS — G4700 Insomnia, unspecified: Secondary | ICD-10-CM

## 2020-06-03 MED ORDER — METRONIDAZOLE 0.75 % EX GEL: 1.0000 "application " | Freq: Two times a day (BID) | CUTANEOUS | 0 refills | Status: DC

## 2020-06-03 NOTE — Assessment & Plan Note (Signed)
Stable.  Is no longer using hydroxyzine.  He will continue taking melatonin as needed.

## 2020-06-03 NOTE — Progress Notes (Signed)
   Kristopher Martinez is a 74 y.o. male who presents today for an office visit.  Assessment/Plan:  New/Acute Problems: Rash Possibly consistent with rosacea.  Will start topical metronidazole.  If is does not adequately control symptoms would consider trial of low potency topical steroid such as desonide.  Chronic Problems Addressed Today: Osteoarthritis Stable.  Continue over-the-counter analgesics.  Recommended ice and compression as well.  Insomnia Stable.  Is no longer using hydroxyzine.  He will continue taking melatonin as needed.  Seborrheic dermatitis Rash on face also possibly consistent with seborrheic dermatitis.  Advised him to continue using ketoconazole.  As noted above may consider trial of low potency topical steroid such as desonide if not improving.     Subjective:  HPI:  Patient here for rash.  Noticed on his face for several years.  Occasionally itching and burning.  He has a history of seborrheic dermatitis and uses ketoconazole for this.  Symptoms are not improved with ketoconazole.  He has also having some issues with osteoarthritis.  Usually it uses over-the-counter meds as needed.       Objective:  Physical Exam: BP 122/74   Pulse 81   Temp 97.7 F (36.5 C) (Temporal)   Ht 5\' 10"  (1.778 m)   Wt 177 lb 6.4 oz (80.5 kg)   SpO2 97%   BMI 25.45 kg/m   Gen: No acute distress, resting comfortably CV: Regular rate and rhythm with no murmurs appreciated Pulm: Normal work of breathing, clear to auscultation bilaterally with no crackles, wheezes, or rhonchi SKIN: Left cheek with erythema and surrounding telangiectasias. Neuro: Grossly normal, moves all extremities Psych: Normal affect and thought content      Janise Gora M. Jerline Pain, MD 06/03/2020 8:32 AM

## 2020-06-03 NOTE — Assessment & Plan Note (Signed)
Stable.  Continue over-the-counter analgesics.  Recommended ice and compression as well.

## 2020-06-03 NOTE — Assessment & Plan Note (Signed)
Rash on face also possibly consistent with seborrheic dermatitis.  Advised him to continue using ketoconazole.  As noted above may consider trial of low potency topical steroid such as desonide if not improving.

## 2020-06-03 NOTE — Patient Instructions (Signed)
It was very nice to see you today!  Please try using the metronidazole gel.  Let me know if not improving.  I will see you back soon for your annual physical with blood work.  Take care, Dr Jerline Pain  Please try these tips to maintain a healthy lifestyle:   Eat at least 3 REAL meals and 1-2 snacks per day.  Aim for no more than 5 hours between eating.  If you eat breakfast, please do so within one hour of getting up.    Each meal should contain half fruits/vegetables, one quarter protein, and one quarter carbs (no bigger than a computer mouse)   Cut down on sweet beverages. This includes juice, soda, and sweet tea.     Drink at least 1 glass of water with each meal and aim for at least 8 glasses per day   Exercise at least 150 minutes every week.

## 2020-06-07 ENCOUNTER — Encounter: Payer: Self-pay | Admitting: Family Medicine

## 2020-06-07 DIAGNOSIS — I4581 Long QT syndrome: Secondary | ICD-10-CM | POA: Insufficient documentation

## 2020-07-19 ENCOUNTER — Other Ambulatory Visit: Payer: Self-pay

## 2020-07-19 ENCOUNTER — Encounter: Payer: Self-pay | Admitting: Family Medicine

## 2020-07-19 ENCOUNTER — Encounter: Payer: Self-pay | Admitting: Physician Assistant

## 2020-07-19 ENCOUNTER — Ambulatory Visit (INDEPENDENT_AMBULATORY_CARE_PROVIDER_SITE_OTHER): Payer: Medicare Other | Admitting: Physician Assistant

## 2020-07-19 VITALS — BP 118/70 | HR 77 | Temp 97.6°F | Ht 70.0 in | Wt 171.4 lb

## 2020-07-19 DIAGNOSIS — H9213 Otorrhea, bilateral: Secondary | ICD-10-CM

## 2020-07-19 MED ORDER — AMOXICILLIN 875 MG PO TABS: 875.0000 mg | ORAL_TABLET | Freq: Two times a day (BID) | ORAL | 0 refills | Status: DC

## 2020-07-19 NOTE — Progress Notes (Signed)
Kristopher Martinez is a 74 y.o. male here for a new problem.  I acted as a Education administrator for [employer], PA-C Anselmo Pickler, LPN   History of Present Illness:   Chief Complaint  Patient presents with   Ear Problem    HPI   Ear problem Pt c/o clear discharge from both ears since Friday. Reports that his ears are swollen. Pt has been flushing ears with warm saline. Denies pain, fever, chills, malaise.   Past Medical History:  Diagnosis Date   Flu    03/23/14     Hemorrhoid    Hyperlipidemia    IBS (irritable bowel syndrome)    Low back pain    Sinusitis    03/23/14   Syncope 1995   did all testing-post-prandal-pt.states was told had long QT segment     Social History   Tobacco Use   Smoking status: Former Smoker    Quit date: 12/31/1976    Years since quitting: 43.5   Smokeless tobacco: Never Used  Vaping Use   Vaping Use: Never used  Substance Use Topics   Alcohol use: Yes    Alcohol/week: 14.0 standard drinks    Types: 14 Glasses of wine per week    Comment: 1-2 GLASSES wine daily per pt   Drug use: No    Past Surgical History:  Procedure Laterality Date   APPENDECTOMY     COLONOSCOPY  2008   HYPERPLASTIC POLYPS X 8    EYE SURGERY     strabismus as child   HYPERPLASIA TISSUE EXCISION     atypical lymphoid    INGUINAL HERNIA REPAIR Right 04/08/2014   Procedure: LAPAROSCOPIC RIGHT INGUINAL HERNIA REPAIR;  Surgeon: Gayland Curry, MD;  Location: [facility];  Service: General;  Laterality: Right;   INSERTION OF MESH Right 04/08/2014   Procedure: INSERTION OF MESH;  Surgeon: Gayland Curry, MD;  Location: [facility];  Service: General;  Laterality: Right;   TONSILLECTOMY     WISDOM TOOTH EXTRACTION      Family History  Problem Relation Age of Onset   Colitis Mother    Alcohol abuse Mother    Cancer Father        prostate cancer   Prostate cancer Father    Prostate cancer Other    Colon cancer Neg Hx     Allergies  Allergen Reactions    Augmentin [Amoxicillin-Pot Clavulanate] Other (See Comments)    Nausea, vomiting, dehydration had to go to hospital.   Trazodone And Nefazodone     Nausea and flushing. Possible passing out?   Cleocin [Clindamycin Hcl] Rash    Current Medications:   Current Outpatient Medications:    ibuprofen (ADVIL,MOTRIN) 200 MG tablet, Take 200 mg by mouth every 6 (six) hours as needed for mild pain., Disp: , Rfl:    ketoconazole (NIZORAL) 2 % shampoo, APPLY ONCE DAILY FOR 5-10 MINUTES FOR 1 WEEK, THEN TWICE WEEKLY AS NEEDED, Disp: 120 mL, Rfl: 0   metroNIDAZOLE (METROGEL) 0.75 % gel, Apply 1 application topically 2 (two) times daily., Disp: 45 g, Rfl: 0   OVER THE COUNTER MEDICATION, Take 1 tablet by mouth at bedtime. POWER TO SLEEP=MELATONIN, Disp: , Rfl:    triamcinolone cream (KENALOG) 0.5 %, Apply 1 application topically 3 (three) times daily., Disp: 30 g, Rfl: 0   amoxicillin (AMOXIL) 875 MG tablet, Take 1 tablet (875 mg total) by mouth 2 (two) times daily., Disp: 20 tablet, Rfl: 0   Review of Systems:  ROS  Negative unless otherwise specified per HPI.  Vitals:   Vitals:   07/19/20 1432  BP: 118/70  Pulse: 77  Temp: 97.6 F (36.4 C)  TempSrc: Temporal  SpO2: 97%  Weight: 171 lb 6.1 oz (77.7 kg)  Height: 5\' 10"  (1.778 m)     Body mass index is 24.59 kg/m.  Physical Exam:   Physical Exam Vitals and nursing note reviewed.  Constitutional:      General: He is not in acute distress.    Appearance: He is well-developed. He is not ill-appearing or toxic-appearing.  HENT:     Right Ear: Ear canal and external ear normal.     Left Ear: Ear canal and external ear normal. Tympanic membrane is erythematous.     Ears:     Comments: Unable to visualize R TM 2/2 wax Cardiovascular:     Rate and Rhythm: Normal rate and regular rhythm.     Pulses: Normal pulses.     Heart sounds: Normal heart sounds, S1 normal and S2 normal.     Comments: No LE edema Pulmonary:     Effort:  Pulmonary effort is normal.     Breath sounds: Normal breath sounds.  Skin:    General: Skin is warm and dry.  Neurological:     Mental Status: He is alert.     GCS: GCS eye subscore is 4. GCS verbal subscore is 5. GCS motor subscore is 6.  Psychiatric:        Speech: Speech normal.        Behavior: Behavior normal. Behavior is cooperative.       Assessment and Plan:   [name] was seen today for ear problem.  Diagnoses and all orders for this visit:  Ear discharge of both ears  Other orders -     amoxicillin (AMOXIL) 875 MG tablet; Take 1 tablet (875 mg total) by mouth 2 (two) times daily.   L TM with erythema.  I cannot visualize the R TM -- I briefly tried to move the wax but it caused discomfort. Will treat with oral amoxicillin (he has augmentin allergy however he states that he can tolerated oral amoxicillin.) Worsening precautions advised.   Reviewed expectations re: course of current medical issues.  Discussed self-management of symptoms.  Outlined signs and symptoms indicating need for more acute intervention.  Patient verbalized understanding and all questions were answered.  See orders for this visit as documented in the electronic medical record.  Patient received an After-Visit Summary.  CMA or LPN served as scribe during this visit. History, Physical, and Plan performed by medical provider. The above documentation has been reviewed and is accurate and complete.  Kristopher Coke, PA-C

## 2020-07-19 NOTE — Patient Instructions (Signed)
It was great to see you!  Oral amoxicillin antibiotic has been sent, please start this.  Follow-up if no improvement or worsening symptoms.  Take care,  [name] PA-C

## 2020-07-25 ENCOUNTER — Encounter: Payer: Self-pay | Admitting: Family Medicine

## 2020-07-26 ENCOUNTER — Telehealth: Payer: Self-pay

## 2020-07-26 DIAGNOSIS — L219 Seborrheic dermatitis, unspecified: Secondary | ICD-10-CM

## 2020-07-26 DIAGNOSIS — R21 Rash and other nonspecific skin eruption: Secondary | ICD-10-CM

## 2020-07-26 NOTE — Telephone Encounter (Signed)
Ok with me. Please place any necessary orders. 

## 2020-07-26 NOTE — Telephone Encounter (Signed)
Referral placed.

## 2020-07-26 NOTE — Telephone Encounter (Signed)
Pt is requesting a referral to a dermatologist. He has been experiencing a rash for " quite sometime" per the patient's words. He has been omitting things from his daily routine to find the culprit and has been unsuccessful. He would like a referral to Dr. Lavonna Monarch.

## 2020-07-27 ENCOUNTER — Telehealth: Payer: Self-pay | Admitting: Dermatology

## 2020-07-27 NOTE — Telephone Encounter (Signed)
Referral, [place](?). Said he can't wait until January.

## 2020-07-28 ENCOUNTER — Ambulatory Visit (INDEPENDENT_AMBULATORY_CARE_PROVIDER_SITE_OTHER): Payer: Medicare Other | Admitting: Family Medicine

## 2020-07-28 ENCOUNTER — Other Ambulatory Visit: Payer: Self-pay

## 2020-07-28 ENCOUNTER — Encounter: Payer: Self-pay | Admitting: Family Medicine

## 2020-07-28 VITALS — BP 112/74 | HR 71 | Temp 98.3°F | Wt 172.0 lb

## 2020-07-28 DIAGNOSIS — L0100 Impetigo, unspecified: Secondary | ICD-10-CM

## 2020-07-28 DIAGNOSIS — L219 Seborrheic dermatitis, unspecified: Secondary | ICD-10-CM

## 2020-07-28 MED ORDER — DESONIDE 0.05 % EX CREA: TOPICAL_CREAM | Freq: Two times a day (BID) | CUTANEOUS | 0 refills | Status: DC

## 2020-07-28 MED ORDER — CIPROFLOXACIN-DEXAMETHASONE 0.3-0.1 % OT SUSP: 4.0000 [drp] | Freq: Two times a day (BID) | OTIC | 0 refills | Status: DC

## 2020-07-28 MED ORDER — MUPIROCIN 2 % EX OINT: 1.0000 "application " | TOPICAL_OINTMENT | Freq: Two times a day (BID) | CUTANEOUS | 0 refills | Status: DC

## 2020-07-28 NOTE — Patient Instructions (Signed)
It was very nice to see you today!  Please start the drops for your ears.  Please try the Bactroban for your outer ear.  Please use the desonide for the rash on your face and arms.  Let me know if not improving in the next week or so.  Take care, Dr Jerline Pain  Please try these tips to maintain a healthy lifestyle:   Eat at least 3 REAL meals and 1-2 snacks per day.  Aim for no more than 5 hours between eating.  If you eat breakfast, please do so within one hour of getting up.    Each meal should contain half fruits/vegetables, one quarter protein, and one quarter carbs (no bigger than a computer mouse)   Cut down on sweet beverages. This includes juice, soda, and sweet tea.     Drink at least 1 glass of water with each meal and aim for at least 8 glasses per day   Exercise at least 150 minutes every week.

## 2020-07-28 NOTE — Assessment & Plan Note (Signed)
Worsening since last visit.  Will start topical desonide.  He will be following up with dermatology in a few months.

## 2020-07-28 NOTE — Progress Notes (Signed)
   Kristopher Martinez is a 74 y.o. male who presents today for an office visit.  Assessment/Plan:  New/Acute Problems: Otitis externa Start Ciprodex drops.  Discussed reasons return to care.  Impetigo Located on bilateral auricles.  Will start topical mupirocin.  Chronic Problems Addressed Today: Seborrheic dermatitis Worsening since last visit.  Will start topical desonide.  He will be following up with dermatology in a few months.     Subjective:  HPI:  Patient here for follow-up.  Was seen by our physician assistant about a week ago for bilateral ear discharge.  He was started on amoxicillin.  He developed a rash and stopped taking amoxicillin.       Objective:  Physical Exam: BP 112/74   Pulse 71   Temp 98.3 F (36.8 C) (Oral)   Wt 172 lb (78 kg)   SpO2 97%   BMI 24.68 kg/m   Gen: No acute distress, resting comfortably HEENT: Bilateral EAC with small amount of white purulent debris.  Bilateral pinna with erythematous.  Slight yellow crusting noticed to left pinna as well. Skin: Erythematous lesion with thick greasy scales on bilateral eyebrows and periorbital skin.      [name]. Jerline Pain, MD 07/28/2020 1:29 PM

## 2020-08-12 ENCOUNTER — Encounter: Payer: Self-pay | Admitting: Family Medicine

## 2020-08-15 ENCOUNTER — Other Ambulatory Visit: Payer: Self-pay

## 2020-08-15 MED ORDER — TADALAFIL 10 MG PO TABS: 10.0000 mg | ORAL_TABLET | Freq: Every day | ORAL | 0 refills | Status: DC | PRN

## 2020-08-17 ENCOUNTER — Encounter: Payer: Self-pay | Admitting: Family Medicine

## 2020-09-02 ENCOUNTER — Other Ambulatory Visit: Payer: Self-pay | Admitting: Family Medicine

## 2020-09-05 IMAGING — CT CT HEAD WITHOUT CONTRAST
3 series · 15 of 47 positions shown, 18 images · non-contrast
Comparison: None available

CLINICAL DATA: Minor head trauma with high clinical risk

EXAM:
CT HEAD WITHOUT CONTRAST
TECHNIQUE: Contiguous axial images were obtained from the base of the skull
through the vertex without intravenous contrast.

[Series 2: head wo · axial · 0.42mm/px · z∈[+1154,+1284]mm · 9 of 32 slices shown, 12 images]
[im 3/32  brain]
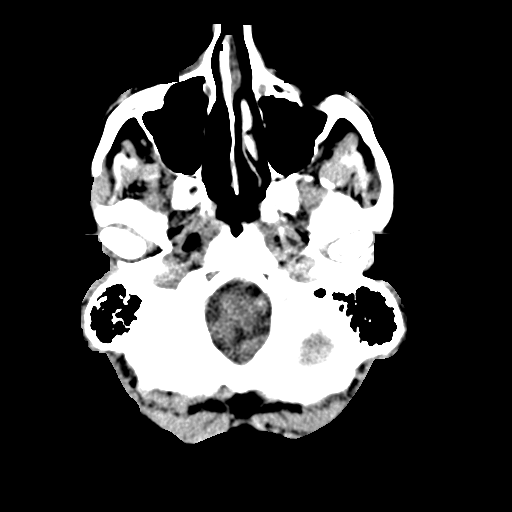
[im 3/32  bone]
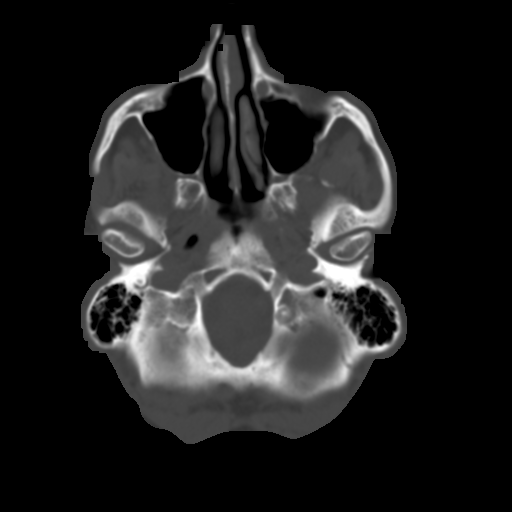
[im 6/32  brain]
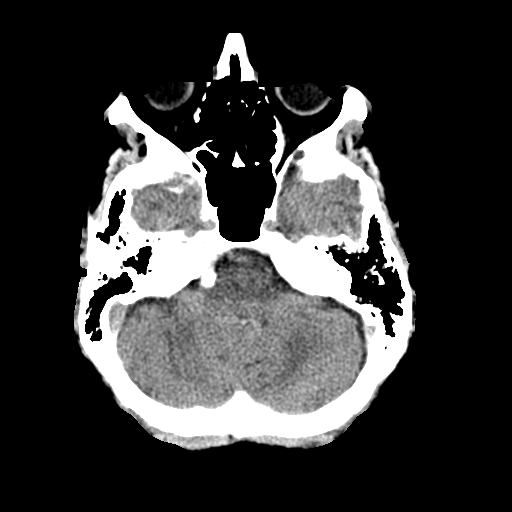
[im 9/32  brain]
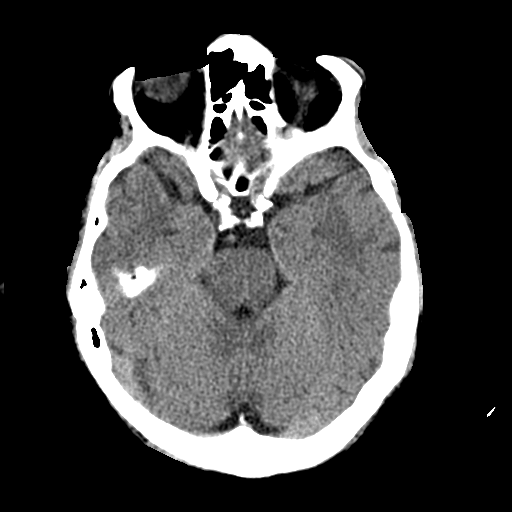
[im 12/32  brain]
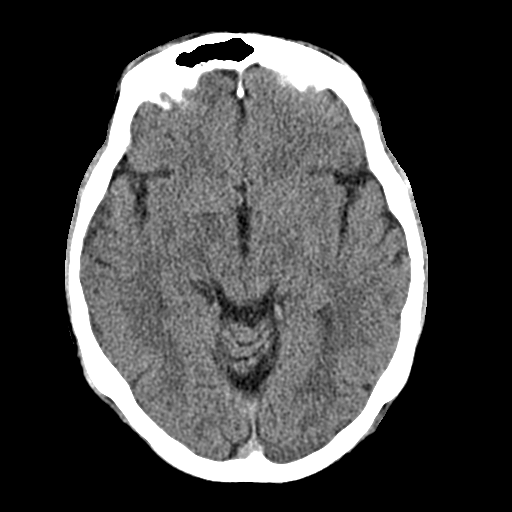
[im 17/32  brain]
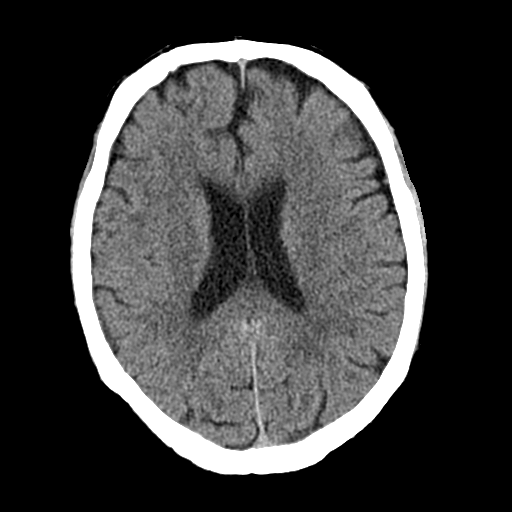
[im 17/32  bone]
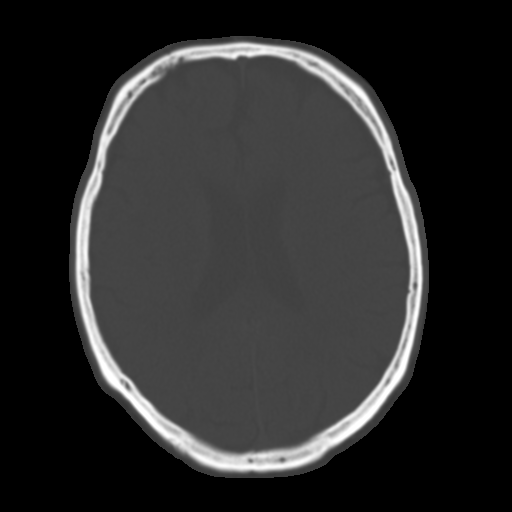
[im 20/32  brain]
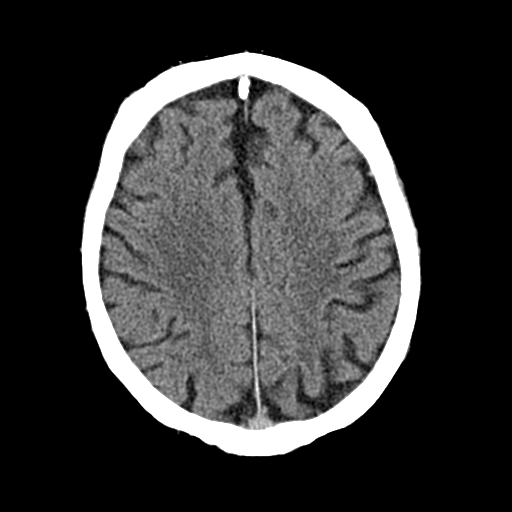
[im 23/32  brain]
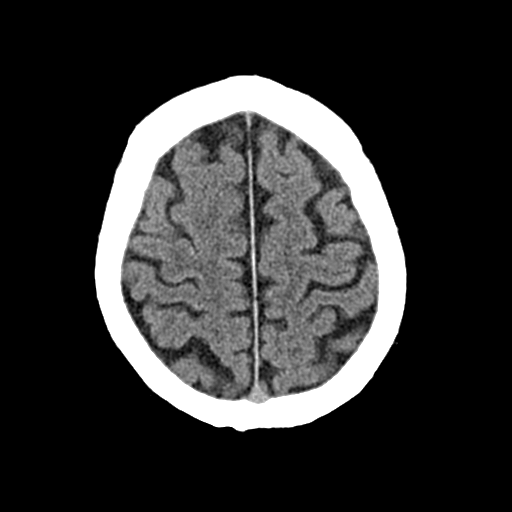
[im 26/32  brain]
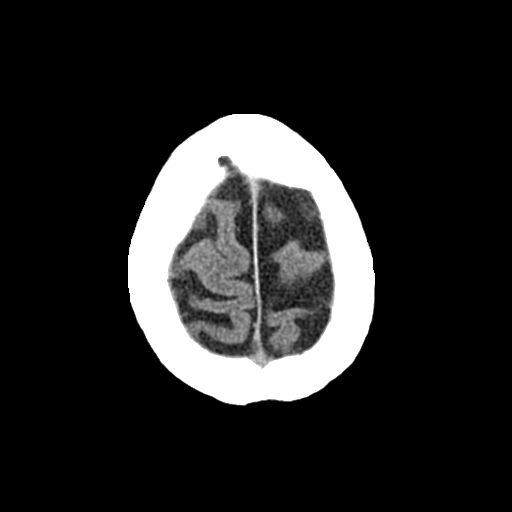
[im 29/32  brain]
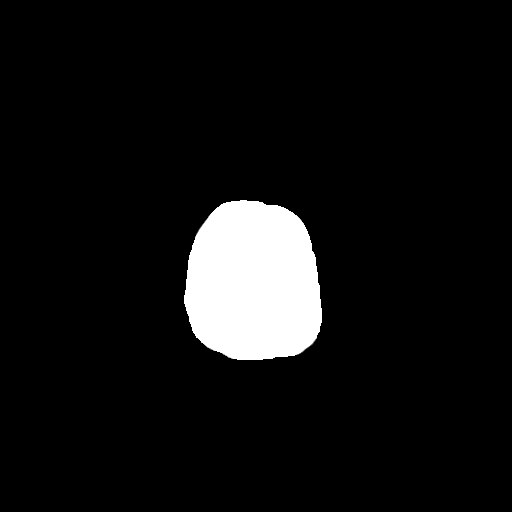
[im 29/32  bone]
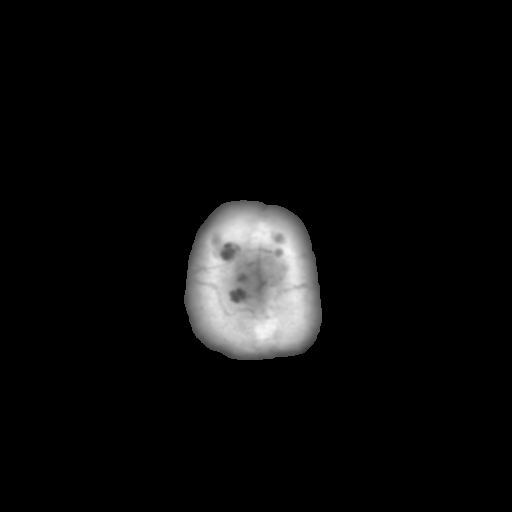

[Series 4: cor soft · coronal · 0.30mm/px · 3 of 71 slices shown]
[im 24/71  brain]
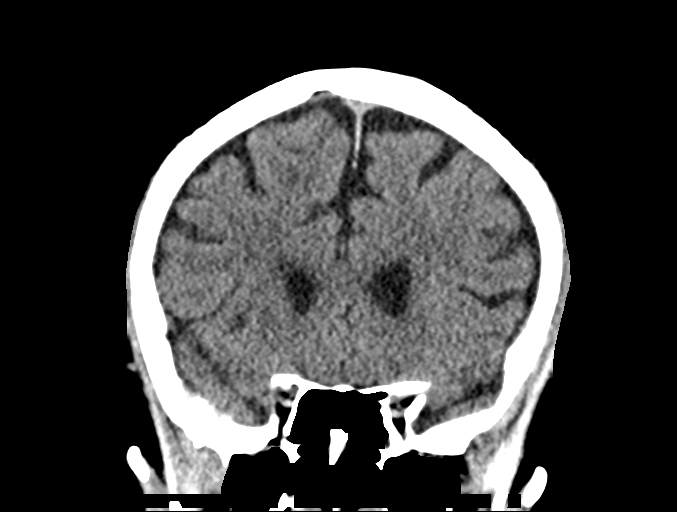
[im 32/71  brain]
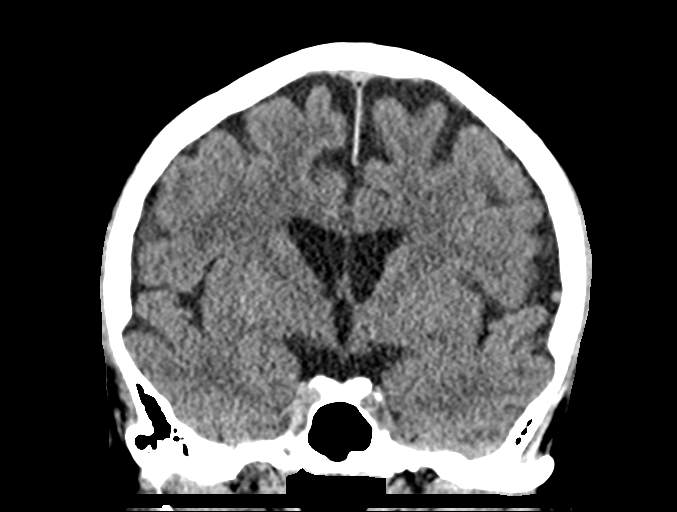
[im 39/71  brain]
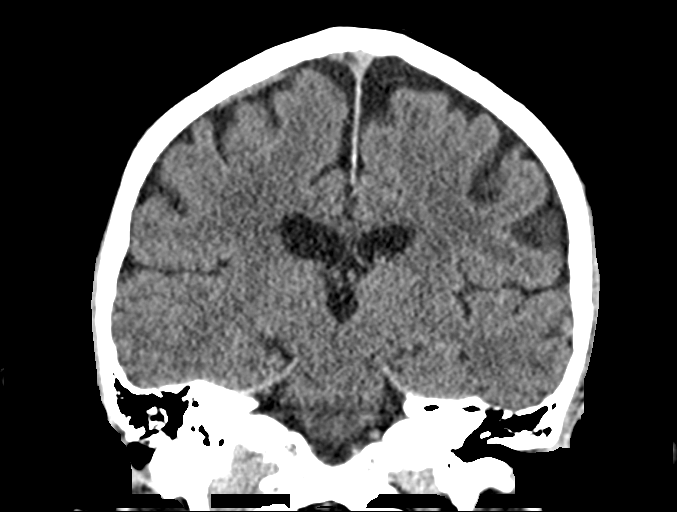

[Series 5: sag soft · sagittal · 0.30mm/px · 3 of 59 slices shown]
[im 20/59  brain]
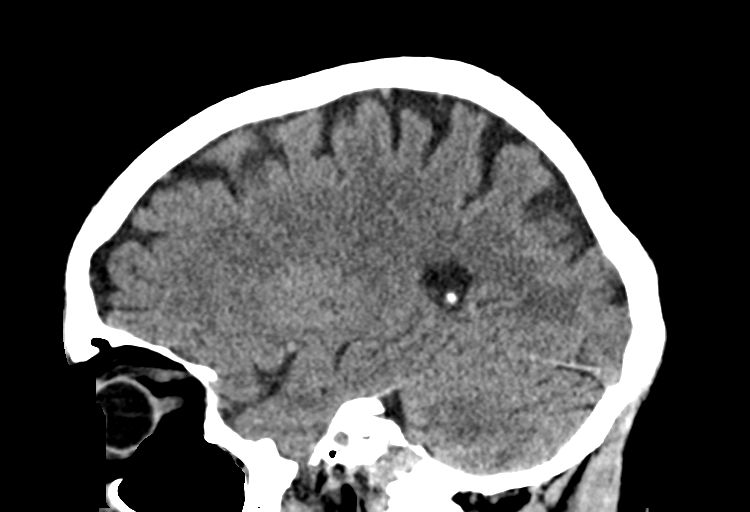
[im 30/59  brain]
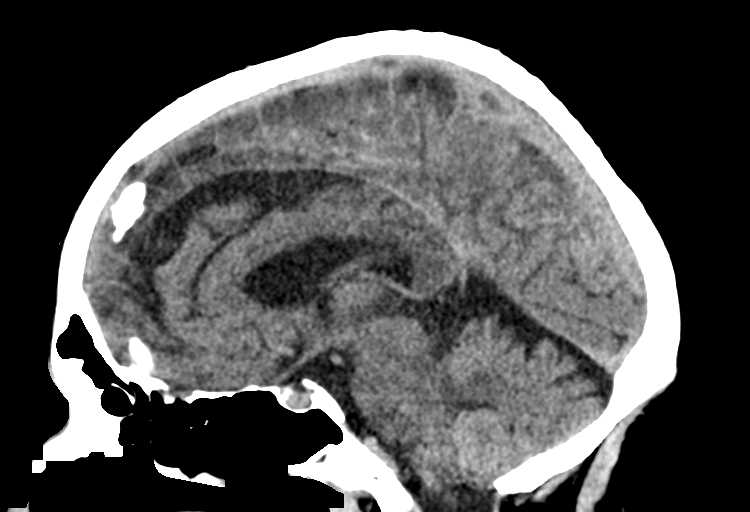
[im 39/59  brain]
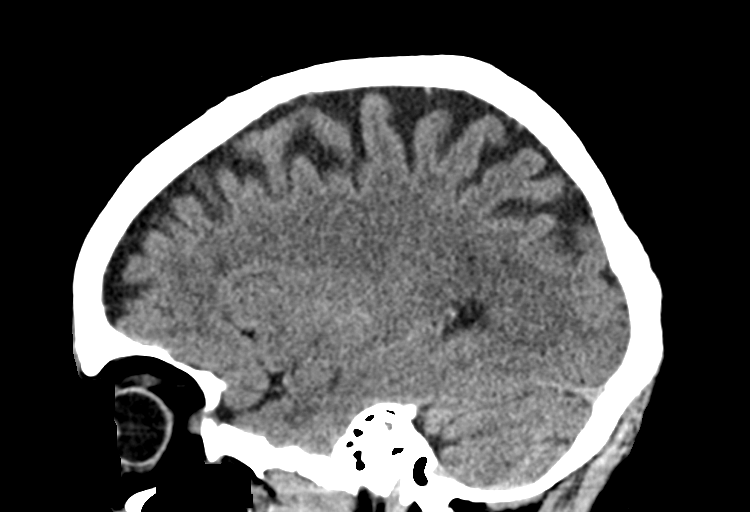

[15 of 47 positions shown; findings below may reference images not displayed]

FINDINGS: Brain: No evidence of acute infarction, hemorrhage, hydrocephalus,
extra-axial collection or mass lesion/mass effect.

Vascular: No hyperdense vessel or unexpected calcification.

Skull: Normal. Negative for fracture or focal lesion.

Sinuses/Orbits: No evidence of injury
IMPRESSION: No evidence of intracranial injury.

## 2020-09-06 DIAGNOSIS — L821 Other seborrheic keratosis: Secondary | ICD-10-CM | POA: Diagnosis not present

## 2020-09-06 DIAGNOSIS — L218 Other seborrheic dermatitis: Secondary | ICD-10-CM | POA: Diagnosis not present

## 2020-09-06 DIAGNOSIS — D225 Melanocytic nevi of trunk: Secondary | ICD-10-CM | POA: Diagnosis not present

## 2020-09-06 DIAGNOSIS — L570 Actinic keratosis: Secondary | ICD-10-CM | POA: Diagnosis not present

## 2020-09-06 DIAGNOSIS — D3610 Benign neoplasm of peripheral nerves and autonomic nervous system, unspecified: Secondary | ICD-10-CM | POA: Diagnosis not present

## 2020-09-16 ENCOUNTER — Other Ambulatory Visit: Payer: Self-pay | Admitting: Family Medicine

## 2020-09-16 MED ORDER — DESONIDE 0.05 % EX CREA: TOPICAL_CREAM | Freq: Two times a day (BID) | CUTANEOUS | 0 refills | Status: DC

## 2020-10-07 ENCOUNTER — Encounter: Payer: Self-pay | Admitting: Physician Assistant

## 2020-10-18 ENCOUNTER — Other Ambulatory Visit: Payer: Self-pay

## 2020-10-18 ENCOUNTER — Encounter: Payer: Self-pay | Admitting: Family Medicine

## 2020-10-18 ENCOUNTER — Ambulatory Visit (INDEPENDENT_AMBULATORY_CARE_PROVIDER_SITE_OTHER): Payer: Medicare Other

## 2020-10-18 DIAGNOSIS — Z23 Encounter for immunization: Secondary | ICD-10-CM | POA: Diagnosis not present

## 2020-11-04 ENCOUNTER — Other Ambulatory Visit: Payer: Self-pay | Admitting: Family Medicine

## 2020-11-04 MED ORDER — TADALAFIL 10 MG PO TABS: 10.0000 mg | ORAL_TABLET | Freq: Every day | ORAL | 0 refills | Status: DC | PRN

## 2020-11-11 ENCOUNTER — Encounter: Payer: Self-pay | Admitting: Physician Assistant

## 2020-11-11 ENCOUNTER — Other Ambulatory Visit: Payer: Self-pay | Admitting: *Deleted

## 2020-11-11 NOTE — Progress Notes (Signed)
Rx added to med list .

## 2020-11-27 ENCOUNTER — Other Ambulatory Visit: Payer: Self-pay | Admitting: Family Medicine

## 2020-11-28 MED ORDER — DESONIDE 0.05 % EX CREA: TOPICAL_CREAM | Freq: Two times a day (BID) | CUTANEOUS | 0 refills | Status: DC

## 2020-12-08 ENCOUNTER — Other Ambulatory Visit: Payer: Self-pay

## 2020-12-08 ENCOUNTER — Ambulatory Visit (INDEPENDENT_AMBULATORY_CARE_PROVIDER_SITE_OTHER): Payer: Medicare Other | Admitting: Family Medicine

## 2020-12-08 ENCOUNTER — Encounter: Payer: Self-pay | Admitting: Family Medicine

## 2020-12-08 VITALS — BP 103/67 | HR 65 | Temp 98.2°F | Ht 70.0 in | Wt 164.4 lb

## 2020-12-08 DIAGNOSIS — L219 Seborrheic dermatitis, unspecified: Secondary | ICD-10-CM

## 2020-12-08 DIAGNOSIS — E785 Hyperlipidemia, unspecified: Secondary | ICD-10-CM

## 2020-12-08 DIAGNOSIS — N529 Male erectile dysfunction, unspecified: Secondary | ICD-10-CM | POA: Diagnosis not present

## 2020-12-08 DIAGNOSIS — L309 Dermatitis, unspecified: Secondary | ICD-10-CM

## 2020-12-08 DIAGNOSIS — Z0001 Encounter for general adult medical examination with abnormal findings: Secondary | ICD-10-CM | POA: Diagnosis not present

## 2020-12-08 DIAGNOSIS — R739 Hyperglycemia, unspecified: Secondary | ICD-10-CM

## 2020-12-08 DIAGNOSIS — Z0000 Encounter for general adult medical examination without abnormal findings: Secondary | ICD-10-CM

## 2020-12-08 DIAGNOSIS — M1990 Unspecified osteoarthritis, unspecified site: Secondary | ICD-10-CM | POA: Diagnosis not present

## 2020-12-08 NOTE — Assessment & Plan Note (Signed)
Check lipids.  Continue lifestyle modifications. 

## 2020-12-08 NOTE — Assessment & Plan Note (Signed)
Continue conservative management with ice, compression, and topical Voltaren.  Can use over-the-counter ibuprofen and Tylenol as needed.

## 2020-12-08 NOTE — Assessment & Plan Note (Signed)
Stable.  Follows with dermatology.  Uses desonide as needed.

## 2020-12-08 NOTE — Assessment & Plan Note (Signed)
Stable.  Continue Cialis as needed. 

## 2020-12-08 NOTE — Assessment & Plan Note (Signed)
Check A1c. 

## 2020-12-08 NOTE — Progress Notes (Signed)
Chief Complaint:  Kristopher Martinez is a 74 y.o. male who presents today for his annual comprehensive physical exam and subsequent medicare annual wellness visit.    Assessment/Plan:  New/Acute Problems: Cyst Benign appearing.  Will be following up with dermatology soon.  Chronic Problems Addressed Today: Hyperglycemia Check A1c.  Osteoarthritis Continue conservative management with ice, compression, and topical Voltaren.  Can use over-the-counter ibuprofen and Tylenol as needed.  Erectile dysfunction Stable.  Continue Cialis as needed.  Seborrheic dermatitis Stable.  Follows with dermatology.  Uses desonide as needed.  Eczema Stable.  Follows with dermatology.  Uses desonide as needed.  Dyslipidemia Check lipids.  Continue lifestyle modifications.  Preventative Healthcare: Up to date on flu vaccine and Covid vaccine.  Due for colonoscopy next February.  No longer needs PSA screening due to age.  Up-to-date on pneumonia vaccine.  Will check labs including CBC, CMET, TSH, and lipid panel.  Patient Counseling(The following topics were reviewed and/or handout was given):  -Nutrition: Stressed importance of moderation in sodium/caffeine intake, saturated fat and cholesterol, caloric balance, sufficient intake of fresh fruits, vegetables, and fiber.  -Stressed the importance of regular exercise.   -Substance Abuse: Discussed cessation/primary prevention of tobacco, alcohol, or other drug use; driving or other dangerous activities under the influence; availability of treatment for abuse.   -Injury prevention: Discussed safety belts, safety helmets, smoke detector, smoking near bedding or upholstery.   -Sexuality: Discussed sexually transmitted diseases, partner selection, use of condoms, avoidance of unintended pregnancy and contraceptive alternatives.   -Dental health: Discussed importance of regular tooth brushing, flossing, and dental visits.  -Health maintenance and immunizations  reviewed. Please refer to Health maintenance section.  During the course of the visit the patient was educated and counseled about appropriate screening and preventive services including:        Fall prevention   Nutrition Physical Activity Weight Management Cognition  Return to care in 1 year for next preventative visit.     Subjective:  HPI:  Health Risk Assessment: Patient considers his overall health to be good. He has no difficulty performing the following: . Preparing food and eating . Bathing  . Getting dressed . Using the toilet . Shopping . Managing Finances . Moving around from place to place  He has not had any falls within the past year.   Depression screen [facility] 2/9 12/08/2020  Decreased Interest 0  Down, Depressed, Hopeless 0  PHQ - 2 Score 0  Altered sleeping 0  Tired, decreased energy 0  Change in appetite 0  Feeling bad or failure about yourself  0  Trouble concentrating 0  Moving slowly or fidgety/restless 0  Suicidal thoughts 0  PHQ-9 Score 0   Lifestyle Factors: Diet: Cutting down on alcohol.  Exercise: Walks the dog daily.   Patient Care Team: Vivi Barrack, MD as PCP - General (Family Medicine)   ROS: Per HPI, otherwise a complete review of systems was negative.   PMH:  The following were reviewed and entered/updated in epic: Past Medical History:  Diagnosis Date  . Flu    03/23/14    . Hemorrhoid   . Hyperlipidemia   . IBS (irritable bowel syndrome)   . Low back pain   . Sinusitis    03/23/14  . Syncope 1995   did all testing-post-prandal-pt.states was told had long QT segment   Patient Active Problem List   Diagnosis Date Noted  . Hyperglycemia 12/08/2020  . Long Q-T syndrome 06/07/2020  . Osteoarthritis  06/03/2020  . Insomnia 08/13/2019  . Eczema 01/30/2019  . Seborrheic dermatitis 01/30/2019  . Erectile dysfunction 01/30/2019  . Dyslipidemia 09/29/2007   Past Surgical History:  Procedure Laterality Date  .  APPENDECTOMY    . COLONOSCOPY  2008   HYPERPLASTIC POLYPS X 8   . EYE SURGERY     strabismus as child  . HYPERPLASIA TISSUE EXCISION     atypical lymphoid   . INGUINAL HERNIA REPAIR Right 04/08/2014   Procedure: LAPAROSCOPIC RIGHT INGUINAL HERNIA REPAIR;  Surgeon: Gayland Curry, MD;  Location: [facility];  Service: General;  Laterality: Right;  . INSERTION OF MESH Right 04/08/2014   Procedure: INSERTION OF MESH;  Surgeon: Gayland Curry, MD;  Location: [facility];  Service: General;  Laterality: Right;  . TONSILLECTOMY    . WISDOM TOOTH EXTRACTION      Family History  Problem Relation Age of Onset  . Colitis Mother   . Alcohol abuse Mother   . Cancer Father        prostate cancer  . Prostate cancer Father   . Prostate cancer Other   . Colon cancer Neg Hx     Medications- reviewed and updated Current Outpatient Medications  Medication Sig Dispense Refill  . desonide (DESOWEN) 0.05 % cream Apply topically 2 (two) times daily. 30 g 0  . fluticasone (CUTIVATE) 0.005 % ointment Apply topically.    Marland Kitchen ibuprofen (ADVIL,MOTRIN) 200 MG tablet Take 200 mg by mouth every 6 (six) hours as needed for mild pain.    Marland Kitchen OVER THE COUNTER MEDICATION Take 1 tablet by mouth at bedtime. POWER TO SLEEP=MELATONIN    . tadalafil (CIALIS) 10 MG tablet Take 1 tablet (10 mg total) by mouth daily as needed for erectile dysfunction. 30 tablet 0   No current facility-administered medications for this visit.    Allergies-reviewed and updated Allergies  Allergen Reactions  . Augmentin [Amoxicillin-Pot Clavulanate] Other (See Comments)    Nausea, vomiting, dehydration had to go to hospital.  . Amoxicillin   . Trazodone And Nefazodone     Nausea and flushing. Possible passing out?  Marland Kitchen Cleocin [Clindamycin Hcl] Rash    Social History   Socioeconomic History  . Marital status: Married    Spouse name: Not on file  . Number of children: 0  . Years of education: Not on file  . Highest education level: Not on file   Occupational History  . Not on file  Tobacco Use  . Smoking status: Former Smoker    Quit date: 12/31/1976    Years since quitting: 43.9  . Smokeless tobacco: Never Used  Vaping Use  . Vaping Use: Never used  Substance and Sexual Activity  . Alcohol use: Yes    Alcohol/week: 14.0 standard drinks    Types: 14 Glasses of wine per week    Comment: 1-2 GLASSES wine daily per pt  . Drug use: No  . Sexual activity: Yes  Other Topics Concern  . Not on file  Social History Narrative  . Not on file   Social Determinants of Health   Financial Resource Strain: Not on file  Food Insecurity: Not on file  Transportation Needs: Not on file  Physical Activity: Not on file  Stress: Not on file  Social Connections: Not on file        Objective:  Physical Exam: BP 103/67   Pulse 65   Temp 98.2 F (36.8 C) (Temporal)   Ht 5\' 10"  (1.778 m)  Wt 164 lb 6.4 oz (74.6 kg)   SpO2 97%   BMI 23.59 kg/m   Body mass index is 23.59 kg/m. Wt Readings from Last 3 Encounters:  12/08/20 164 lb 6.4 oz (74.6 kg)  07/28/20 172 lb (78 kg)  07/19/20 171 lb 6.1 oz (77.7 kg)   Gen: NAD, resting comfortably HEENT: TMs normal bilaterally. OP clear. No thyromegaly noted.  CV: RRR with no murmurs appreciated Pulm: NWOB, CTAB with no crackles, wheezes, or rhonchi GI: Normal bowel sounds present. Soft, Nontender, Nondistended. MSK: no edema, cyanosis, or clubbing noted Skin: warm, dry Neuro: CN2-12 grossly intact. Strength 5/5 in upper and lower extremities. Reflexes symmetric and intact bilaterally. Normal minicog with 3/3 delayed word recall.  Psych: Normal affect and thought content     Chantrell Apsey M. Jerline Pain, MD 12/08/2020 9:58 AM

## 2020-12-08 NOTE — Patient Instructions (Signed)
It was very nice to see you today!  We will check blood work today.  You will be due for your colonoscopy next February.  They should call you about this.  You have arthritis in your knee.  Please use ice to the area.  You can also use a compression sleeve.  You can also use Voltaren as needed.  I will see you back in a year.  Please come back to see me sooner if needed.  Take care, Dr Jerline Pain  Please try these tips to maintain a healthy lifestyle:   Eat at least 3 REAL meals and 1-2 snacks per day.  Aim for no more than 5 hours between eating.  If you eat breakfast, please do so within one hour of getting up.    Each meal should contain half fruits/vegetables, one quarter protein, and one quarter carbs (no bigger than a computer mouse)   Cut down on sweet beverages. This includes juice, soda, and sweet tea.     Drink at least 1 glass of water with each meal and aim for at least 8 glasses per day   Exercise at least 150 minutes every week.    Preventive Care 46 Years and Older, Male Preventive care refers to lifestyle choices and visits with your health care provider that can promote health and wellness. This includes:  A yearly physical exam. This is also called an annual well check.  Regular dental and eye exams.  Immunizations.  Screening for certain conditions.  Healthy lifestyle choices, such as diet and exercise. What can I expect for my preventive care visit? Physical exam Your health care provider will check:  Height and weight. These may be used to calculate body mass index (BMI), which is a measurement that tells if you are at a healthy weight.  Heart rate and blood pressure.  Your skin for abnormal spots. Counseling Your health care provider may ask you questions about:  Alcohol, tobacco, and drug use.  Emotional well-being.  Home and relationship well-being.  Sexual activity.  Eating habits.  History of falls.  Memory and ability to  understand (cognition).  Work and work Statistician. What immunizations do I need?  Influenza (flu) vaccine  This is recommended every year. Tetanus, diphtheria, and pertussis (Tdap) vaccine  You may need a Td booster every 10 years. Varicella (chickenpox) vaccine  You may need this vaccine if you have not already been vaccinated. Zoster (shingles) vaccine  You may need this after age 57. Pneumococcal conjugate (PCV13) vaccine  One dose is recommended after age 30. Pneumococcal polysaccharide (PPSV23) vaccine  One dose is recommended after age 12. Measles, mumps, and rubella (MMR) vaccine  You may need at least one dose of MMR if you were born in 1957 or later. You may also need a second dose. Meningococcal conjugate (MenACWY) vaccine  You may need this if you have certain conditions. Hepatitis A vaccine  You may need this if you have certain conditions or if you travel or work in places where you may be exposed to hepatitis A. Hepatitis B vaccine  You may need this if you have certain conditions or if you travel or work in places where you may be exposed to hepatitis B. Haemophilus influenzae type b (Hib) vaccine  You may need this if you have certain conditions. You may receive vaccines as individual doses or as more than one vaccine together in one shot (combination vaccines). Talk with your health care provider about the risks and  benefits of combination vaccines. What tests do I need? Blood tests  Lipid and cholesterol levels. These may be checked every 5 years, or more frequently depending on your overall health.  Hepatitis C test.  Hepatitis B test. Screening  Lung cancer screening. You may have this screening every year starting at age 36 if you have a 30-pack-year history of smoking and currently smoke or have quit within the past 15 years.  Colorectal cancer screening. All adults should have this screening starting at age 84 and continuing until age 54.  Your health care provider may recommend screening at age 31 if you are at increased risk. You will have tests every 1-10 years, depending on your results and the type of screening test.  Prostate cancer screening. Recommendations will vary depending on your family history and other risks.  Diabetes screening. This is done by checking your blood sugar (glucose) after you have not eaten for a while (fasting). You may have this done every 1-3 years.  Abdominal aortic aneurysm (AAA) screening. You may need this if you are a current or former smoker.  Sexually transmitted disease (STD) testing. Follow these instructions at home: Eating and drinking  Eat a diet that includes fresh fruits and vegetables, whole grains, lean protein, and low-fat dairy products. Limit your intake of foods with high amounts of sugar, saturated fats, and salt.  Take vitamin and mineral supplements as recommended by your health care provider.  Do not drink alcohol if your health care provider tells you not to drink.  If you drink alcohol: ? Limit how much you have to 0-2 drinks a day. ? Be aware of how much alcohol is in your drink. In the [place] one drink equals one 12 oz bottle of beer (355 mL), one 5 oz glass of wine (148 mL), or one 1 oz glass of hard liquor (44 mL). Lifestyle  Take daily care of your teeth and gums.  Stay active. Exercise for at least 30 minutes on 5 or more days each week.  Do not use any products that contain nicotine or tobacco, such as cigarettes, e-cigarettes, and chewing tobacco. If you need help quitting, ask your health care provider.  If you are sexually active, practice safe sex. Use a condom or other form of protection to prevent STIs (sexually transmitted infections).  Talk with your health care provider about taking a low-dose aspirin or statin. What's next?  Visit your health care provider once a year for a well check visit.  Ask your health care provider how often you  should have your eyes and teeth checked.  Stay up to date on all vaccines. This information is not intended to replace advice given to you by your health care provider. Make sure you discuss any questions you have with your health care provider. Document Revised: 12/11/2018 Document Reviewed: 12/11/2018 Elsevier Patient Education  2020 [employer].

## 2020-12-09 LAB — COMPREHENSIVE METABOLIC PANEL
AG Ratio: 1.8 (calc) (ref 1.0–2.5)
ALT: 31 U/L (ref 9–46)
AST: 23 U/L (ref 10–35)
Albumin: 4.5 g/dL (ref 3.6–5.1)
Alkaline phosphatase (APISO): 63 U/L (ref 35–144)
BUN: 24 mg/dL (ref 7–25)
CO2: 28 mmol/L (ref 20–32)
Calcium: 9.5 mg/dL (ref 8.6–10.3)
Chloride: 104 mmol/L (ref 98–110)
Creat: 0.93 mg/dL (ref 0.70–1.18)
Globulin: 2.5 g/dL (calc) (ref 1.9–3.7)
Glucose, Bld: 80 mg/dL (ref 65–99)
Potassium: 4.7 mmol/L (ref 3.5–5.3)
Sodium: 141 mmol/L (ref 135–146)
Total Bilirubin: 0.9 mg/dL (ref 0.2–1.2)
Total Protein: 7 g/dL (ref 6.1–8.1)

## 2020-12-09 LAB — HEMOGLOBIN A1C
Hgb A1c MFr Bld: 5.1 % of total Hgb (ref <5.7)
Mean Plasma Glucose: 100 mg/dL
eAG (mmol/L): 5.5 mmol/L

## 2020-12-09 LAB — CBC
HCT: 47.6 % (ref 38.5–50.0)
Hemoglobin: 16.3 g/dL (ref 13.2–17.1)
MCH: 32.5 pg (ref 27.0–33.0)
MCHC: 34.2 g/dL (ref 32.0–36.0)
MCV: 94.8 fL (ref 80.0–100.0)
MPV: 10.4 fL (ref 7.5–12.5)
Platelets: 242 10*3/uL (ref 140–400)
RBC: 5.02 10*6/uL (ref 4.20–5.80)
RDW: 12.2 % (ref 11.0–15.0)
WBC: 5.8 10*3/uL (ref 3.8–10.8)

## 2020-12-09 LAB — LIPID PANEL
Cholesterol: 240 mg/dL — ABNORMAL HIGH (ref <200)
HDL: 74 mg/dL (ref >=40)
LDL Cholesterol (Calc): 147 mg/dL (calc) — ABNORMAL HIGH
Non-HDL Cholesterol (Calc): 166 mg/dL (calc) — ABNORMAL HIGH (ref <130)
Total CHOL/HDL Ratio: 3.2 (calc) (ref <5.0)
Triglycerides: 86 mg/dL (ref <150)

## 2020-12-09 LAB — TSH: TSH: 1.94 mIU/L (ref 0.40–4.50)

## 2020-12-09 NOTE — Progress Notes (Signed)
Please inform patient of the following:  Cholesterol up a bit but everything else is stbale.  Recommend starting Lipitor 40 mg daily to improve cholesterol numbers and lower risk of heart attack and stroke.  Please send in if he is willing to start.  Regardless he should continue working on diet and exercise and will recheck in 1 year.

## 2020-12-17 ENCOUNTER — Encounter: Payer: Self-pay | Admitting: Family Medicine

## 2021-01-07 ENCOUNTER — Encounter: Payer: Self-pay | Admitting: Physician Assistant

## 2021-01-07 ENCOUNTER — Encounter: Payer: Self-pay | Admitting: Family Medicine

## 2021-01-09 ENCOUNTER — Other Ambulatory Visit: Payer: Self-pay

## 2021-01-09 DIAGNOSIS — L218 Other seborrheic dermatitis: Secondary | ICD-10-CM | POA: Diagnosis not present

## 2021-01-09 DIAGNOSIS — L819 Disorder of pigmentation, unspecified: Secondary | ICD-10-CM | POA: Diagnosis not present

## 2021-01-09 DIAGNOSIS — L814 Other melanin hyperpigmentation: Secondary | ICD-10-CM | POA: Diagnosis not present

## 2021-01-09 DIAGNOSIS — D229 Melanocytic nevi, unspecified: Secondary | ICD-10-CM | POA: Diagnosis not present

## 2021-01-09 DIAGNOSIS — L570 Actinic keratosis: Secondary | ICD-10-CM | POA: Diagnosis not present

## 2021-01-09 DIAGNOSIS — L821 Other seborrheic keratosis: Secondary | ICD-10-CM | POA: Diagnosis not present

## 2021-01-09 MED ORDER — ATORVASTATIN CALCIUM 20 MG PO TABS: 20.0000 mg | ORAL_TABLET | Freq: Every day | ORAL | 3 refills | Status: DC

## 2021-01-09 NOTE — Telephone Encounter (Signed)
Please advise 

## 2021-01-10 ENCOUNTER — Other Ambulatory Visit: Payer: Self-pay

## 2021-01-10 MED ORDER — ATORVASTATIN CALCIUM 20 MG PO TABS: 20.0000 mg | ORAL_TABLET | Freq: Every day | ORAL | 3 refills | Status: DC

## 2021-01-30 ENCOUNTER — Other Ambulatory Visit: Payer: Self-pay | Admitting: Family Medicine

## 2021-01-30 NOTE — Telephone Encounter (Signed)
LAST APPOINTMENT DATE: 12/08/2020   NEXT APPOINTMENT DATE: Visit date not found    LAST REFILL:11/04/2020  QTY:30

## 2021-02-06 DIAGNOSIS — H524 Presbyopia: Secondary | ICD-10-CM | POA: Diagnosis not present

## 2021-03-28 ENCOUNTER — Encounter: Payer: Self-pay | Admitting: Family Medicine

## 2021-03-28 ENCOUNTER — Telehealth: Payer: Self-pay

## 2021-03-28 NOTE — Telephone Encounter (Signed)
Please advise 

## 2021-03-28 NOTE — Telephone Encounter (Signed)
Patient states he was called about scheduling colonoscopy and was returning call to set up date

## 2021-03-29 NOTE — Telephone Encounter (Signed)
Called patient, information given  Will call later for appointment with PCP

## 2021-03-29 NOTE — Telephone Encounter (Signed)
See below

## 2021-03-31 NOTE — Telephone Encounter (Signed)
Ok to send referral to GI?

## 2021-03-31 NOTE — Telephone Encounter (Signed)
Left voice message referral send in call GI office for refills

## 2021-03-31 NOTE — Telephone Encounter (Signed)
He has already been referred to GI - he just needs to call them.  [name]. Jerline Pain, MD 03/31/2021 10:15 AM

## 2021-04-05 ENCOUNTER — Other Ambulatory Visit: Payer: Self-pay

## 2021-04-05 ENCOUNTER — Ambulatory Visit (INDEPENDENT_AMBULATORY_CARE_PROVIDER_SITE_OTHER): Payer: Medicare Other | Admitting: Family Medicine

## 2021-04-05 ENCOUNTER — Encounter: Payer: Self-pay | Admitting: Family Medicine

## 2021-04-05 VITALS — BP 119/76 | HR 66 | Temp 97.9°F | Ht 70.0 in | Wt 160.2 lb

## 2021-04-05 DIAGNOSIS — E785 Hyperlipidemia, unspecified: Secondary | ICD-10-CM | POA: Diagnosis not present

## 2021-04-05 DIAGNOSIS — G4700 Insomnia, unspecified: Secondary | ICD-10-CM

## 2021-04-05 DIAGNOSIS — R634 Abnormal weight loss: Secondary | ICD-10-CM

## 2021-04-05 NOTE — Assessment & Plan Note (Signed)
Stable.  Taking melatonin as needed.  Has been working on sleep hygiene which seems to be working well.

## 2021-04-05 NOTE — Progress Notes (Signed)
   Kristopher Martinez is a 75 y.o. male who presents today for an office visit.  Assessment/Plan:  Chronic Problems Addressed Today: Insomnia Stable.  Taking melatonin as needed.  Has been working on sleep hygiene which seems to be working well.  Dyslipidemia We started Lipitor few months ago.  He has done well with this I was concerned about weight loss.  His home scales been reading 155.  He is 160 today.  Discussed with patient that we will keep an eye on this but weight loss is not typically associated with Lipitor.  We can recheck lipids in a few months.  We will monitor his weight over the next several months as well.  Preventative Healthcare He will follow-up with GI soon for colonoscopy.  Given number for him to contact them today.    Subjective:  HPI:  See a/p.         Objective:  Physical Exam: BP 119/76   Pulse 66   Temp 97.9 F (36.6 C) (Temporal)   Ht 5\' 10"  (1.778 m)   Wt 160 lb 3.2 oz (72.7 kg)   SpO2 98%   BMI 22.99 kg/m   Wt Readings from Last 3 Encounters:  04/05/21 160 lb 3.2 oz (72.7 kg)  12/08/20 164 lb 6.4 oz (74.6 kg)  07/28/20 172 lb (78 kg)  Gen: No acute distress, resting comfortably Neuro: Grossly normal, moves all extremities Psych: Normal affect and thought content      Kileigh Ortmann M. Jerline Pain, MD 04/05/2021 9:43 AM

## 2021-04-05 NOTE — Patient Instructions (Addendum)
It was very nice to see you today!  Your weight today is stable.  We will keep an eye on this over the next several months.  Please call [phone] to schedule a GI visit for your colonoscopy.   Take care, Dr Jerline Pain  PLEASE NOTE:  If you had any lab tests please let us know if you have not heard back within a few days. You may see your results on mychart before we have a chance to review them but we will give you a call once they are reviewed by [place]. If we ordered any referrals today, please let us know if you have not heard from their office within the next week.   Please try these tips to maintain a healthy lifestyle:   Eat at least 3 REAL meals and 1-2 snacks per day.  Aim for no more than 5 hours between eating.  If you eat breakfast, please do so within one hour of getting up.    Each meal should contain half fruits/vegetables, one quarter protein, and one quarter carbs (no bigger than a computer mouse)   Cut down on sweet beverages. This includes juice, soda, and sweet tea.     Drink at least 1 glass of water with each meal and aim for at least 8 glasses per day   Exercise at least 150 minutes every week.

## 2021-04-05 NOTE — Assessment & Plan Note (Signed)
We started Lipitor few months ago.  He has done well with this I was concerned about weight loss.  His home scales been reading 155.  He is 160 today.  Discussed with patient that we will keep an eye on this but weight loss is not typically associated with Lipitor.  We can recheck lipids in a few months.  We will monitor his weight over the next several months as well.

## 2021-05-09 ENCOUNTER — Encounter: Payer: Self-pay | Admitting: Family Medicine

## 2021-05-09 NOTE — Telephone Encounter (Signed)
Patient and patient wife  positive for Covid  With Sx runny nose, fatigue  Both patient are double buster  Advise patient  if SOB to go to ED  Please advise

## 2021-05-25 ENCOUNTER — Other Ambulatory Visit: Payer: Self-pay | Admitting: Family Medicine

## 2021-06-12 ENCOUNTER — Encounter: Payer: Self-pay | Admitting: Family Medicine

## 2021-07-13 ENCOUNTER — Encounter: Payer: Self-pay | Admitting: Gastroenterology

## 2021-07-31 ENCOUNTER — Encounter: Payer: Self-pay | Admitting: Gastroenterology

## 2021-08-30 ENCOUNTER — Other Ambulatory Visit: Payer: Self-pay | Admitting: Family Medicine

## 2021-08-30 MED ORDER — DESONIDE 0.05 % EX CREA: TOPICAL_CREAM | Freq: Two times a day (BID) | CUTANEOUS | 0 refills | Status: DC

## 2021-09-05 ENCOUNTER — Other Ambulatory Visit: Payer: Self-pay | Admitting: Family Medicine

## 2021-09-06 NOTE — Telephone Encounter (Signed)
See pharmacy note

## 2021-09-11 ENCOUNTER — Encounter: Payer: Self-pay | Admitting: Family Medicine

## 2021-09-11 ENCOUNTER — Other Ambulatory Visit: Payer: Self-pay

## 2021-09-11 ENCOUNTER — Ambulatory Visit (INDEPENDENT_AMBULATORY_CARE_PROVIDER_SITE_OTHER): Payer: Medicare Other | Admitting: Family Medicine

## 2021-09-11 VITALS — BP 108/72 | HR 52 | Temp 98.2°F | Ht 70.0 in | Wt 161.2 lb

## 2021-09-11 DIAGNOSIS — Z23 Encounter for immunization: Secondary | ICD-10-CM

## 2021-09-11 DIAGNOSIS — L219 Seborrheic dermatitis, unspecified: Secondary | ICD-10-CM

## 2021-09-11 DIAGNOSIS — I4581 Long QT syndrome: Secondary | ICD-10-CM | POA: Diagnosis not present

## 2021-09-11 DIAGNOSIS — E785 Hyperlipidemia, unspecified: Secondary | ICD-10-CM

## 2021-09-11 MED ORDER — HYDROCORTISONE-ACETIC ACID 1-2 % OT SOLN: 4.0000 [drp] | Freq: Three times a day (TID) | OTIC | 0 refills | Status: DC

## 2021-09-11 NOTE — Patient Instructions (Signed)
It was very nice to see you today!  Please start the drops for your ears.  We will get your flu shot today.  Please let us know if she would like to be referred to see a cardiologist.  I will see you back in 3 to 6 months for your annual checkup.  Please come back to see me sooner if needed.  Take care, Dr Jerline Pain  PLEASE NOTE:  If you had any lab tests please let us know if you have not heard back within a few days. You may see your results on mychart before we have a chance to review them but we will give you a call once they are reviewed by [place]. If we ordered any referrals today, please let us know if you have not heard from their office within the next week.   Please try these tips to maintain a healthy lifestyle:  Eat at least 3 REAL meals and 1-2 snacks per day.  Aim for no more than 5 hours between eating.  If you eat breakfast, please do so within one hour of getting up.   Each meal should contain half fruits/vegetables, one quarter protein, and one quarter carbs (no bigger than a computer mouse)  Cut down on sweet beverages. This includes juice, soda, and sweet tea.   Drink at least 1 glass of water with each meal and aim for at least 8 glasses per day  Exercise at least 150 minutes every week.

## 2021-09-11 NOTE — Progress Notes (Signed)
   Kristopher Martinez is a 75 y.o. male who presents today for an office visit.  Assessment/Plan:  New/Acute Problems: Syncope Reassuring exam today.  Does have a history of long QT syndrome that was diagnosed by cardiology several years ago.  It is concerning that his most recent syncopal episode occurred without obvious warning signs.  Recommended referral to cardiology and cardiac monitoring however patient declined at the visit this time.  He will let me know if he changes his mind.  Chronic Problems Addressed Today: Seborrheic dermatitis Located in bilateral ears.  We will start acetic acid-hydrocortisone drops.  He will let me know if not improving.  Dyslipidemia On Lipitor 20 mg daily.  We can recheck in 3 to 6 months when he comes back for CPE.  Flu shot given today.     Subjective:  HPI:   See A/p for status of chronic conditions.   He states that he had a syncope event July 30th.  Has a history of recurrent syncopal events.  Has seen cardiologist for this several years ago diagnosed with long QT syndrome.  His previous most recentepisode was about 2 years ago prior to this. During the episode that occurred 6 weeks ago patient thinks it was due to eating his dinner fairly quickly with a glass of wine and then going to a concert. After finally going home, he had some dessert which was followed shortly after by the syncope when he had stood up. Duration of unconsciousness ~3 minutes.   Also, he notes that the syncope seems to be related to stressful events, for example when his dog suddenly started having a nosebleed.           Objective:  Physical Exam: BP 108/72   Pulse (!) 52   Temp 98.2 F (36.8 C)   Ht '5\' 10"'$  (1.778 m)   Wt 161 lb 3.2 oz (73.1 kg)   SpO2 96%   BMI 23.13 kg/m   Gen: No acute distress, resting comfortably HEENT: Bilateral ears with seborrheic dermatitis CV: Regular rate and rhythm with no murmurs appreciated Pulm: Normal work of breathing, clear  to auscultation bilaterally with no crackles, wheezes, or rhonchi Neuro: Grossly normal, moves all extremities Psych: Normal affect and thought content EENT: Inflammation in ears     I,Jordan Kelly,acting as a scribe for Dimas Chyle, MD.,have documented all relevant documentation on the behalf of Dimas Chyle, MD,as directed by  Dimas Chyle, MD while in the presence of Dimas Chyle, MD.  I, Dimas Chyle, MD, have reviewed all documentation for this visit. The documentation on 09/11/21 for the exam, diagnosis, procedures, and orders are all accurate and complete.  [name]. Jerline Pain, MD 09/11/2021 10:20 AM

## 2021-09-11 NOTE — Assessment & Plan Note (Signed)
Located in bilateral ears.  We will start acetic acid-hydrocortisone drops.  He will let me know if not improving.

## 2021-09-11 NOTE — Assessment & Plan Note (Signed)
On Lipitor 20 mg daily.  We can recheck in 3 to 6 months when he comes back for CPE.

## 2021-09-14 ENCOUNTER — Encounter: Payer: Self-pay | Admitting: Family Medicine

## 2021-09-14 ENCOUNTER — Other Ambulatory Visit: Payer: Self-pay | Admitting: *Deleted

## 2021-09-14 DIAGNOSIS — I4581 Long QT syndrome: Secondary | ICD-10-CM

## 2021-09-14 NOTE — Telephone Encounter (Signed)
See note  Cardiology referral placed,per advised on last visit

## 2021-09-27 ENCOUNTER — Other Ambulatory Visit: Payer: Self-pay

## 2021-09-27 ENCOUNTER — Encounter: Payer: Self-pay | Admitting: Gastroenterology

## 2021-09-27 ENCOUNTER — Ambulatory Visit (AMBULATORY_SURGERY_CENTER): Payer: Self-pay | Admitting: *Deleted

## 2021-09-27 VITALS — Ht 70.0 in | Wt 162.0 lb

## 2021-09-27 DIAGNOSIS — Z86010 Personal history of colonic polyps: Secondary | ICD-10-CM

## 2021-09-27 MED ORDER — NA SULFATE-K SULFATE-MG SULF 17.5-3.13-1.6 GM/177ML PO SOLN: 1.0000 | ORAL | 0 refills | Status: DC

## 2021-09-27 NOTE — Progress Notes (Signed)
Patient is here in-person for PV. Patient denies any allergies to eggs or soy. Patient denies any problems with anesthesia/sedation. Patient is not on any oxygen at home. Patient is not taking any diet/weight loss medications or blood thinners. Patient is aware of our care-partner policy and Covid-19 safety protocol.   EMMI education assigned to the patient for the procedure, sent to MyChart.   Patient is COVID-19 vaccinated.  

## 2021-10-02 ENCOUNTER — Other Ambulatory Visit: Payer: Self-pay | Admitting: Family Medicine

## 2021-10-02 MED ORDER — TADALAFIL 10 MG PO TABS: 10.0000 mg | ORAL_TABLET | Freq: Every day | ORAL | 0 refills | Status: DC | PRN

## 2021-10-11 ENCOUNTER — Ambulatory Visit (AMBULATORY_SURGERY_CENTER): Payer: Medicare Other | Admitting: Gastroenterology

## 2021-10-11 ENCOUNTER — Encounter: Payer: Self-pay | Admitting: Gastroenterology

## 2021-10-11 ENCOUNTER — Other Ambulatory Visit: Payer: Self-pay

## 2021-10-11 VITALS — BP 110/68 | HR 59 | Temp 97.3°F | Resp 12 | Ht 70.0 in | Wt 162.0 lb

## 2021-10-11 DIAGNOSIS — E7800 Pure hypercholesterolemia, unspecified: Secondary | ICD-10-CM | POA: Diagnosis not present

## 2021-10-11 DIAGNOSIS — K621 Rectal polyp: Secondary | ICD-10-CM | POA: Diagnosis not present

## 2021-10-11 DIAGNOSIS — Z86010 Personal history of colonic polyps: Secondary | ICD-10-CM | POA: Diagnosis not present

## 2021-10-11 DIAGNOSIS — D124 Benign neoplasm of descending colon: Secondary | ICD-10-CM

## 2021-10-11 DIAGNOSIS — K635 Polyp of colon: Secondary | ICD-10-CM | POA: Diagnosis not present

## 2021-10-11 DIAGNOSIS — D128 Benign neoplasm of rectum: Secondary | ICD-10-CM

## 2021-10-11 DIAGNOSIS — D123 Benign neoplasm of transverse colon: Secondary | ICD-10-CM | POA: Diagnosis not present

## 2021-10-11 MED ORDER — SODIUM CHLORIDE 0.9 % IV SOLN: 500.0000 mL | Freq: Once | INTRAVENOUS | Status: DC

## 2021-10-11 NOTE — Op Note (Signed)
[facility] Patient Name: Kristopher Martinez Procedure Date: 10/11/2021 11:26 AM MRN: [id number] Endoscopist: Ladene Artist , MD Age: 75 Referring MD:  Date of Birth: Nov 13, 1946 Gender: Male Account #: [id number] Procedure:                Colonoscopy Indications:              Surveillance: Personal history of adenomatous                            polyps on last colonoscopy 3 years ago Medicines:                Monitored Anesthesia Care Procedure:                Pre-Anesthesia Assessment:                           - Prior to the procedure, a History and Physical                            was performed, and patient medications and                            allergies were reviewed. The patient's tolerance of                            previous anesthesia was also reviewed. The risks                            and benefits of the procedure and the sedation                            options and risks were discussed with the patient.                            All questions were answered, and informed consent                            was obtained. Prior Anticoagulants: The patient has                            taken no previous anticoagulant or antiplatelet                            agents. ASA Grade Assessment: II - A patient with                            mild systemic disease. After reviewing the risks                            and benefits, the patient was deemed in                            satisfactory condition to undergo the procedure.  After obtaining informed consent, the colonoscope                            was passed under direct vision. Throughout the                            procedure, the patient's blood pressure, pulse, and                            oxygen saturations were monitored continuously. The                            Olympus PCF-H190DL ([id number]) Colonoscope was                            introduced through the  anus and advanced to the the                            cecum, identified by appendiceal orifice and                            ileocecal valve. The ileocecal valve, appendiceal                            orifice, and rectum were photographed. The quality                            of the bowel preparation was good. The colonoscopy                            was performed without difficulty. The patient                            tolerated the procedure well. Scope In: 11:28:49 AM Scope Out: 11:48:15 AM Scope Withdrawal Time: 0 hours 14 minutes 21 seconds  Total Procedure Duration: 0 hours 19 minutes 26 seconds  Findings:                 The perianal and digital rectal examinations were                            normal.                           Four sessile polyps were found in the rectum (1),                            descending colon (2) and transverse colon (1). The                            polyps were 6 to 8 mm in size. These polyps were                            removed with a cold snare. Resection and retrieval  were complete.                           Internal hemorrhoids were found during                            retroflexion. The hemorrhoids were small and Grade                            I (internal hemorrhoids that do not prolapse).                           The exam was otherwise without abnormality on                            direct and retroflexion views. Complications:            No immediate complications. Estimated blood loss:                            None. Estimated Blood Loss:     Estimated blood loss: none. Impression:               - Four 6 to 8 mm polyps in the rectum, in the                            descending colon and in the transverse colon,                            removed with a cold snare. Resected and retrieved.                           - Internal hemorrhoids.                           - The examination was  otherwise normal on direct                            and retroflexion views. Recommendation:           - Repeat colonoscopy vs no repeat due to age after                            studies are complete for surveillance based on                            pathology results.                           - Patient has a contact number available for                            emergencies. The signs and symptoms of potential                            delayed complications were discussed with the  patient. Return to normal activities tomorrow.                            Written discharge instructions were provided to the                            patient.                           - Resume previous diet.                           - Continue present medications.                           - Await pathology results. Ladene Artist, MD 10/11/2021 11:51:35 AM This report has been signed electronically.

## 2021-10-11 NOTE — Progress Notes (Signed)
Called to room to assist during endoscopic procedure.  Patient ID and intended procedure confirmed with present staff. Received instructions for my participation in the procedure from the performing physician.  

## 2021-10-11 NOTE — Progress Notes (Signed)
Report given to PACU, vss 

## 2021-10-11 NOTE — Progress Notes (Signed)
See 09/11/2021 H&P, no changes.

## 2021-10-11 NOTE — Progress Notes (Signed)
Data will not transfer from monitor, vs being posted manually.   

## 2021-10-11 NOTE — Patient Instructions (Signed)
Handout given:  polyps Resume previous diet Continue current medications Await pathology results  YOU HAD AN ENDOSCOPIC PROCEDURE TODAY AT [facility]:   Refer to the procedure report that was given to you for any specific questions about what was found during the examination.  If the procedure report does not answer your questions, please call your gastroenterologist to clarify.  If you requested that your care partner not be given the details of your procedure findings, then the procedure report has been included in a sealed envelope for you to review at your convenience later.  YOU SHOULD EXPECT: Some feelings of bloating in the abdomen. Passage of more gas than usual.  Walking can help get rid of the air that was put into your GI tract during the procedure and reduce the bloating. If you had a lower endoscopy (such as a colonoscopy or flexible sigmoidoscopy) you may notice spotting of blood in your stool or on the toilet paper. If you underwent a bowel prep for your procedure, you may not have a normal bowel movement for a few days.  Please Note:  You might notice some irritation and congestion in your nose or some drainage.  This is from the oxygen used during your procedure.  There is no need for concern and it should clear up in a day or so.  SYMPTOMS TO REPORT IMMEDIATELY:  Following lower endoscopy (colonoscopy or flexible sigmoidoscopy):  Excessive amounts of blood in the stool  Significant tenderness or worsening of abdominal pains  Swelling of the abdomen that is new, acute  Fever of 100F or higher  For urgent or emergent issues, a gastroenterologist can be reached at any hour by calling [phone]. Do not use MyChart messaging for urgent concerns.   DIET:  We do recomend a small meal at first, but then you may proceed to your regular diet.  Drink plenty of fluids but you should avoid alcoholic beverages for 24 hours.  ACTIVITY:  You should plan to take it  easy for the rest of today and you should NOT DRIVE or use heavy machinery until tomorrow (because of the sedation medicines used during the test).    FOLLOW UP: Our staff will call the number listed on your records 48-72 hours following your procedure to check on you and address any questions or concerns that you may have regarding the information given to you following your procedure. If we do not reach you, we will leave a message.  We will attempt to reach you two times.  During this call, we will ask if you have developed any symptoms of COVID 19. If you develop any symptoms (ie: fever, flu-like symptoms, shortness of breath, cough etc.) before then, please call [phone].  If you test positive for Covid 19 in the 2 weeks post procedure, please call and report this information to [place].    If any biopsies were taken you will be contacted by phone or by letter within the next 1-3 weeks.  Please call us at [phone] if you have not heard about the biopsies in 3 weeks.   SIGNATURES/CONFIDENTIALITY: You and/or your care partner have signed paperwork which will be entered into your electronic medical record.  These signatures attest to the fact that that the information above on your After Visit Summary has been reviewed and is understood.  Full responsibility of the confidentiality of this discharge information lies with you and/or your care-partner.

## 2021-10-11 NOTE — Progress Notes (Signed)
Pt's states no medical or surgical changes since previsit or office visit. VS by CW. 

## 2021-10-13 ENCOUNTER — Telehealth: Payer: Self-pay | Admitting: *Deleted

## 2021-10-13 NOTE — Telephone Encounter (Signed)
  Follow up Call-  Call back number 10/11/2021  Post procedure Call Back phone  # [phone]  Permission to leave phone message Yes  Some recent data might be hidden     Patient questions:  Do you have a fever, pain , or abdominal swelling? No. Pain Score  0 *  Have you tolerated food without any problems? Yes.    Have you been able to return to your normal activities? Yes.    Do you have any questions about your discharge instructions: Diet   No. Medications  No. Follow up visit  No.  Do you have questions or concerns about your Care? No.  Actions: * If pain score is 4 or above: No action needed, pain <4.  Have you developed a fever since your procedure? no  2.   Have you had an respiratory symptoms (SOB or cough) since your procedure? no  3.   Have you tested positive for COVID 19 since your procedure no  4.   Have you had any family members/close contacts diagnosed with the COVID 19 since your procedure?  no   If yes to any of these questions please route to Joylene John, RN and Joella Prince, RN

## 2021-10-20 ENCOUNTER — Encounter (HOSPITAL_BASED_OUTPATIENT_CLINIC_OR_DEPARTMENT_OTHER): Payer: Self-pay

## 2021-10-20 ENCOUNTER — Emergency Department (HOSPITAL_BASED_OUTPATIENT_CLINIC_OR_DEPARTMENT_OTHER): Payer: Medicare Other

## 2021-10-20 ENCOUNTER — Telehealth: Payer: Self-pay

## 2021-10-20 ENCOUNTER — Emergency Department (HOSPITAL_BASED_OUTPATIENT_CLINIC_OR_DEPARTMENT_OTHER)
Admission: EM | Admit: 2021-10-20 | Discharge: 2021-10-20 | Disposition: A | Discharge status: Home / Self Care | Payer: Medicare Other | Attending: Emergency Medicine | Admitting: Emergency Medicine

## 2021-10-20 ENCOUNTER — Other Ambulatory Visit: Payer: Self-pay

## 2021-10-20 DIAGNOSIS — R42 Dizziness and giddiness: Secondary | ICD-10-CM | POA: Diagnosis not present

## 2021-10-20 DIAGNOSIS — R339 Retention of urine, unspecified: Secondary | ICD-10-CM | POA: Insufficient documentation

## 2021-10-20 DIAGNOSIS — R55 Syncope and collapse: Secondary | ICD-10-CM | POA: Diagnosis not present

## 2021-10-20 DIAGNOSIS — Z87891 Personal history of nicotine dependence: Secondary | ICD-10-CM | POA: Diagnosis not present

## 2021-10-20 DIAGNOSIS — K5900 Constipation, unspecified: Secondary | ICD-10-CM | POA: Diagnosis not present

## 2021-10-20 LAB — BASIC METABOLIC PANEL
Anion gap: 15 (ref 5–15)
BUN: 16 mg/dL (ref 8–23)
CO2: 22 mmol/L (ref 22–32)
Calcium: 9.5 mg/dL (ref 8.9–10.3)
Chloride: 99 mmol/L (ref 98–111)
Creatinine, Ser: 0.86 mg/dL (ref 0.61–1.24)
GFR, Estimated: >60 mL/min (ref >60)
Glucose, Bld: 124 mg/dL — ABNORMAL HIGH (ref 70–99)
Potassium: 3.9 mmol/L (ref 3.5–5.1)
Sodium: 136 mmol/L (ref 135–145)

## 2021-10-20 LAB — CBC
HCT: 44.1 % (ref 39.0–52.0)
Hemoglobin: 15.3 g/dL (ref 13.0–17.0)
MCH: 31.7 pg (ref 26.0–34.0)
MCHC: 34.7 g/dL (ref 30.0–36.0)
MCV: 91.3 fL (ref 80.0–100.0)
Platelets: 219 10*3/uL (ref 150–400)
RBC: 4.83 MIL/uL (ref 4.22–5.81)
RDW: 12.9 % (ref 11.5–15.5)
WBC: 13.2 10*3/uL — ABNORMAL HIGH (ref 4.0–10.5)
nRBC: 0 % (ref 0.0–0.2)

## 2021-10-20 LAB — URINALYSIS, ROUTINE W REFLEX MICROSCOPIC
Bilirubin Urine: NEGATIVE
Glucose, UA: NEGATIVE mg/dL
Hgb urine dipstick: NEGATIVE
Ketones, ur: 40 mg/dL — AB
Leukocytes,Ua: NEGATIVE
Nitrite: NEGATIVE
Specific Gravity, Urine: 1.018 (ref 1.005–1.030)
pH: 7 (ref 5.0–8.0)

## 2021-10-20 MED ORDER — POLYETHYLENE GLYCOL 3350 17 G PO PACK: 17.0000 g | PACK | Freq: Every day | ORAL | 0 refills | Status: DC | PRN

## 2021-10-20 NOTE — ED Notes (Signed)
Pt was able to have a BM.  

## 2021-10-20 NOTE — ED Notes (Signed)
Pt verbalizes understanding of discharge instructions. Opportunity for questioning and answers were provided. Armand removed by staff, pt discharged from ED to home. Educated to pick up Rx. Stay hydrated and increase fiber.

## 2021-10-20 NOTE — ED Notes (Signed)
Assisted patient to the [facility]. He sat there for appx 30 minutes and unable to go at this time. No vagal. MD notified. Pt helped back into bed.

## 2021-10-20 NOTE — ED Triage Notes (Signed)
Patient here POV from Home with Constipation and Syncope.  Patient has been constipated since this AM and has attempted several interventions including [employer]. Patient was on the Commode approximately 1 Hour PTA and had a Syncopal Episode.  History of Syncope and recent History (approximately 2 weeks PTA) of Routine, Uncomplicated Colonoscopy.  NAD Noted during Triage besides Weakness. A&Ox4. GCS 15. No Neurological Deficits note during Triage. BIB Wheelchair.

## 2021-10-20 NOTE — Discharge Instructions (Addendum)
There appears to be some urinary bladder even after you try to urinate.  Follow-up with your doctors for further evaluation of this.  Take the MiraLAX to try and help with the constipation.  Constipation could be the cause of it.  Return for abdominal pain or fevers.

## 2021-10-20 NOTE — ED Provider Notes (Signed)
[facility] EMERGENCY DEPT Provider Note   CSN: [id number] Arrival date & time: 10/20/21  1903     History Chief Complaint  Patient presents with   Loss of Consciousness   Constipation    Kristopher Martinez is a 75 y.o. male.   Loss of Consciousness Associated symptoms: no nausea and no shortness of breath   Constipation Associated symptoms: abdominal pain   Associated symptoms: no back pain, no dysuria and no nausea   Patient resents a syncopal episode.  Reportedly had been strained have a bowel movement.  Had a small bowel movement yesterday but was attempting for a hour to have a bowel movement today.  Then felt lightheaded and passed out.  Has had some constipation.  Feels if he has to have a bowel movement but cannot.  Mild lower abdominal pain and cramping.  States has a history of a prolonged QT.  Has an appointment to see Dr. Caryl Comes in 10 days.  Has had years of passing out however.  Usually GI related.    Past Medical History:  Diagnosis Date   Flu    03/23/14     Hemorrhoid    Hyperlipidemia    IBS (irritable bowel syndrome)    Low back pain    Sinusitis    03/23/14   Syncope 1995   did all testing-post-prandal-pt.states was told had long QT segment    Patient Active Problem List   Diagnosis Date Noted   Hyperglycemia 12/08/2020   Long Q-T syndrome 06/07/2020   Osteoarthritis 06/03/2020   Insomnia 08/13/2019   Eczema 01/30/2019   Seborrheic dermatitis 01/30/2019   Erectile dysfunction 01/30/2019   Dyslipidemia 09/29/2007    Past Surgical History:  Procedure Laterality Date   APPENDECTOMY     COLONOSCOPY  2008   HYPERPLASTIC POLYPS X 8    COLONOSCOPY  02/05/2018   Dr.Stark   EYE SURGERY     strabismus as child   HYPERPLASIA TISSUE EXCISION     atypical lymphoid    INGUINAL HERNIA REPAIR Right 04/08/2014   Procedure: LAPAROSCOPIC RIGHT INGUINAL HERNIA REPAIR;  Surgeon: Gayland Curry, MD;  Location: [facility];  Service: General;   Laterality: Right;   INSERTION OF MESH Right 04/08/2014   Procedure: INSERTION OF MESH;  Surgeon: Gayland Curry, MD;  Location: [facility];  Service: General;  Laterality: Right;   POLYPECTOMY     TONSILLECTOMY     WISDOM TOOTH EXTRACTION         Family History  Problem Relation Age of Onset   Colitis Mother    Alcohol abuse Mother    Cancer Father        prostate cancer   Prostate cancer Father    Prostate cancer Other    Colon cancer Neg Hx    Colon polyps Neg Hx    Esophageal cancer Neg Hx    Rectal cancer Neg Hx    Stomach cancer Neg Hx     Social History   Tobacco Use   Smoking status: Former    Types: Cigarettes    Quit date: 12/31/1976    Years since quitting: 44.8   Smokeless tobacco: Never  Vaping Use   Vaping Use: Never used  Substance Use Topics   Alcohol use: Yes    Alcohol/week: 2.0 standard drinks    Types: 2 Glasses of wine per week   Drug use: No    Home Medications Prior to Admission medications   Medication Sig Start Date  End Date Taking? Authorizing Provider  polyethylene glycol (MIRALAX / GLYCOLAX) 17 g packet Take 17 g by mouth daily as needed for moderate constipation. 10/20/21  Yes Davonna Belling, MD  atorvastatin (LIPITOR) 20 MG tablet Take 1 tablet (20 mg total) by mouth daily. 01/10/21   Vivi Barrack, MD  ibuprofen (ADVIL,MOTRIN) 200 MG tablet Take 200 mg by mouth every 6 (six) hours as needed for mild pain.    [provider]  OVER THE COUNTER MEDICATION Take 1 tablet by mouth at bedtime. POWER TO SLEEP=MELATONIN    [provider]  tadalafil (CIALIS) 10 MG tablet Take 1 tablet (10 mg total) by mouth daily as needed for erectile dysfunction. 10/02/21   Vivi Barrack, MD  triamcinolone cream (KENALOG) 0.1 % Apply topically 2 (two) times daily. 09/07/21   Vivi Barrack, MD    Allergies    Augmentin [amoxicillin-pot clavulanate], Amoxicillin, Trazodone and nefazodone, and Cleocin [clindamycin hcl]  Review of Systems    Review of Systems  Constitutional:  Negative for appetite change.  HENT:  Negative for congestion.   Respiratory:  Negative for shortness of breath.   Cardiovascular:  Positive for syncope.  Gastrointestinal:  Positive for abdominal pain and constipation. Negative for nausea.  Genitourinary:  Negative for dysuria.  Musculoskeletal:  Negative for back pain.  Skin:  Negative for rash.  Neurological:  Positive for syncope.  Psychiatric/Behavioral:  Negative for behavioral problems.    Physical Exam Updated Vital Signs BP 127/85   Pulse 74   Temp 97.8 F (36.6 C) (Oral)   Resp 15   Ht 5\' 10"  (1.778 m)   Wt 73.5 kg   SpO2 98%   BMI 23.24 kg/m   Physical Exam Vitals and nursing note reviewed.  HENT:     Head: Atraumatic.  Eyes:     Pupils: Pupils are equal, round, and reactive to light.  Cardiovascular:     Rate and Rhythm: Regular rhythm.  Pulmonary:     Breath sounds: No wheezing or rhonchi.  Abdominal:     Tenderness: There is abdominal tenderness.     Comments: Fullness to suprapubic/lower abdomen.  Possible bladder.  No hernia palpated.  Genitourinary:    Comments: Rectal exam showed a fair amount of stool.  Broken up with finger.  No mass palpated. Musculoskeletal:        General: No tenderness.     Cervical back: Neck supple.  Skin:    Capillary Refill: Capillary refill takes less than 2 seconds.  Neurological:     Mental Status: He is alert and oriented to person, place, and time.    ED Results / Procedures / Treatments   Labs (all labs ordered are listed, but only abnormal results are displayed) Labs Reviewed  BASIC METABOLIC PANEL - Abnormal; Notable for the following components:      Result Value   Glucose, Bld 124 (*)    All other components within normal limits  CBC - Abnormal; Notable for the following components:   WBC 13.2 (*)    All other components within normal limits  URINALYSIS, ROUTINE W REFLEX MICROSCOPIC - Abnormal; Notable for the  following components:   Ketones, ur 40 (*)    Protein, ur TRACE (*)    All other components within normal limits  CBG MONITORING, ED    EKG EKG Interpretation  Date/Time:  Friday October 20 2021 19:25:44 EDT Ventricular Rate:  69 PR Interval:  154 QRS Duration: 102 QT Interval:  428 QTC Calculation: 458 R Axis:   79 Text Interpretation: Normal sinus rhythm Incomplete right bundle branch block Borderline ECG Confirmed by Davonna Belling [phone]) on 10/20/2021 7:35:52 PM  Radiology DG Abd 2 Views  Result Date: 10/20/2021 CLINICAL DATA:  Constipated for several days EXAM: ABDOMEN - 2 VIEW COMPARISON:  04/30/2014. FINDINGS: The bowel gas pattern is normal. No evidence of free air. No dilated loops of small bowel to suggest obstruction. Stool and gas are seen extending to the rectum. Surgical clips in the right lower quadrant. Imaged lung bases are clear. IMPRESSION: Negative. Electronically Signed   By: Merilyn Baba M.D.   On: 10/20/2021 21:36    Procedures Procedures   Medications Ordered in ED Medications - No data to display  ED Course  I have reviewed the triage vital signs and the nursing notes.  Pertinent labs & imaging results that were available during my care of the patient were reviewed by me and considered in my medical decision making (see chart for details).    MDM Rules/Calculators/A&P                           Patient with syncopal episode.  I think likely related to GI causes.  Has history of same.  Does have history of prolonged QT and is due to see electrophysiology in another 10 days.  Doubt life-threatening arrhythmia as the cause.  However does have constipation.  Rectal exam showed a large amount of stool that was broken up.  Still difficulty having bowel movement and fullness in lower abdomen that appear to be bladder.  Initially difficulty urinating 2.  Ended up getting an enema.  He did have more urination and more bowel movements.  Feeling better.   X-ray did not show obstruction.  White count is mildly elevated but I think more likely reactive.  We will follow-up with PCP.  We will add MiraLAX to help with bowel movement.  Will need to follow-up the bladder to make sure he is not consistently retaining.  However appears stable for discharge home. Final Clinical Impression(s) / ED Diagnoses Final diagnoses:  Constipation  Urinary retention  Syncope, unspecified syncope type    Rx / DC Orders ED Discharge Orders          Ordered    polyethylene glycol (MIRALAX / GLYCOLAX) 17 g packet  Daily PRN        10/20/21 2244             Davonna Belling, MD 10/20/21 2256

## 2021-10-23 NOTE — Telephone Encounter (Signed)
See note

## 2021-10-24 ENCOUNTER — Encounter: Payer: Self-pay | Admitting: Gastroenterology

## 2021-10-30 ENCOUNTER — Encounter: Payer: Self-pay | Admitting: Internal Medicine

## 2021-10-30 ENCOUNTER — Other Ambulatory Visit: Payer: Self-pay

## 2021-10-30 ENCOUNTER — Ambulatory Visit: Payer: Medicare Other | Admitting: Internal Medicine

## 2021-10-30 VITALS — BP 118/80 | HR 78 | Ht 70.0 in | Wt 160.6 lb

## 2021-10-30 DIAGNOSIS — I4581 Long QT syndrome: Secondary | ICD-10-CM

## 2021-10-30 NOTE — Patient Instructions (Signed)

## 2021-10-30 NOTE — Progress Notes (Signed)
ELECTROPHYSIOLOGY CONSULT NOTE  Patient ID: Kristopher Martinez, MRN: [id number], DOB/AGE: 1946/03/27 75 y.o. Admit date: (Not on file) Date of Consult: 10/30/2021  Primary Physician: Vivi Barrack, MD Primary Cardiologist: new    HPI Kristopher Martinez is a 75 y.o. male who is being seen today for the evaluation of syncope in the setting of possible long QT syndrome at the request of Dr. Jerline Pain.   Longstanding history of syncope; most recent episode occurred 7/22.  Week prior he had a colonoscopy and had a pre-cancerous polyps removed. He became constipated and had the syncopal episode on the toilet.     Today, he is doing well. He was diagnosed with long QT in 2007. Previous syncope episode occurred 6/21.  At that wife reports he woke up then lost consciousness again.  This was recognizable because of a stereotypical prodrome including tunnel vision, loss of hearing tingling in the fingertips and lightheadedness.  Duration 4-5 seconds.  The recovery phase was also stereotypical characterized by fatigue clamminess significant pallor and weakness.  No associated nausea.  Some residual orthostatic intolerance  Also has a history of getting lightheaded after standing up too quickly and on 1 occasion earlier this summer had orthostatic syncope.  Typically low blood pressure. So Restricts himself to 1 glass of wine.  The patient denies chest pain, shortness of breath, nocturnal dyspnea, orthopnea or peripheral edema.  There have been no palpitations.  Complains of syncope, lightheadedness (As above)   DATE TEST EF         Date Cr K Hgb  12/21 0.93 4.7 16.3  10/22 0.86 3.9 15.3      Past Medical History:  Diagnosis Date   Flu    03/23/14     Hemorrhoid    Hyperlipidemia    IBS (irritable bowel syndrome)    Low back pain    Sinusitis    03/23/14   Syncope 1995   did all testing-post-prandal-pt.states was told had long QT segment      Surgical History:  Past Surgical History:   Procedure Laterality Date   APPENDECTOMY     COLONOSCOPY  2008   HYPERPLASTIC POLYPS X 8    COLONOSCOPY  02/05/2018   Dr.Stark   EYE SURGERY     strabismus as child   HYPERPLASIA TISSUE EXCISION     atypical lymphoid    INGUINAL HERNIA REPAIR Right 04/08/2014   Procedure: LAPAROSCOPIC RIGHT INGUINAL HERNIA REPAIR;  Surgeon: Gayland Curry, MD;  Location: [facility];  Service: General;  Laterality: Right;   INSERTION OF MESH Right 04/08/2014   Procedure: INSERTION OF MESH;  Surgeon: Gayland Curry, MD;  Location: [facility];  Service: General;  Laterality: Right;   POLYPECTOMY     TONSILLECTOMY     WISDOM TOOTH EXTRACTION       Home Meds: Current Meds  Medication Sig   atorvastatin (LIPITOR) 20 MG tablet Take 1 tablet (20 mg total) by mouth daily.   ibuprofen (ADVIL,MOTRIN) 200 MG tablet Take 200 mg by mouth every 6 (six) hours as needed for mild pain.   OVER THE COUNTER MEDICATION Take 1 tablet by mouth at bedtime. POWER TO SLEEP=MELATONIN   polyethylene glycol (MIRALAX / GLYCOLAX) 17 g packet Take 17 g by mouth daily as needed for moderate constipation.   tadalafil (CIALIS) 10 MG tablet Take 1 tablet (10 mg total) by mouth daily as needed for erectile dysfunction.   triamcinolone cream (KENALOG) 0.1 %  Apply topically 2 (two) times daily.    Allergies:  Allergies  Allergen Reactions   Augmentin [Amoxicillin-Pot Clavulanate] Other (See Comments)    Nausea, vomiting, dehydration had to go to hospital.   Amoxicillin    Trazodone And Nefazodone     Nausea and flushing. Possible passing out?   Cleocin [Clindamycin Hcl] Rash    Social History   Socioeconomic History   Marital status: Married    Spouse name: Not on file   Number of children: 0   Years of education: Not on file   Highest education level: Not on file  Occupational History   Not on file  Tobacco Use   Smoking status: Former    Types: Cigarettes    Quit date: 12/31/1976    Years since quitting: 44.8   Smokeless  tobacco: Never  Vaping Use   Vaping Use: Never used  Substance and Sexual Activity   Alcohol use: Yes    Alcohol/week: 2.0 standard drinks    Types: 2 Glasses of wine per week   Drug use: No   Sexual activity: Yes  Other Topics Concern   Not on file  Social History Narrative   Not on file   Social Determinants of Health   Financial Resource Strain: Not on file  Food Insecurity: Not on file  Transportation Needs: Not on file  Physical Activity: Not on file  Stress: Not on file  Social Connections: Not on file  Intimate Partner Violence: Not on file     Family History  Problem Relation Age of Onset   Colitis Mother    Alcohol abuse Mother    Cancer Father        prostate cancer   Prostate cancer Father    Prostate cancer Other    Colon cancer Neg Hx    Colon polyps Neg Hx    Esophageal cancer Neg Hx    Rectal cancer Neg Hx    Stomach cancer Neg Hx      ROS:  Please see the history of present illness.    All other systems reviewed and negative.   Physical Exam: BP 118/80   Pulse 78   Ht 5\' 10"  (1.778 m)   Wt 160 lb 9.6 oz (72.8 kg)   SpO2 97%   BMI 23.04 kg/m   Alert and oriented in no acute distress HENT- normal Eyes- EOMI, without scleral icterus Skin- warm and dry; without rashes LN-neg Neck- supple without thyromegaly, JVP-flat, carotids brisk and full without bruits Back-without CVAT or kyphosis Lungs-clear to auscultation CV-Regular rate and rhythm, nl S1 and S2, no murmurs gallops or rubs, S4-absent Abd-soft with active bowel sounds; no midline pulsation or hepatomegaly Pulses-intact femoral and distal MKS-without gross deformity Neuro- Ax O, CN3-12 intact, grossly normal motor and sensory function Affect engaging  ECG sinus at 78 Intervals 16/10/38 with a QTC of 435 and an RSR prime  ECGs were reviewed 7/15, 8/20, 10/22, 5/12.  QT intervals on these range from 400--440 ms with a QTC NOAC from about 470 ms   Labs: Cardiac Enzymes No results  for input(s): CKTOTAL, CKMB, TROPONINI in the last 72 hours. CBC Lab Results  Component Value Date   WBC 13.2 (H) 10/20/2021   HGB 15.3 10/20/2021   HCT 44.1 10/20/2021   MCV 91.3 10/20/2021   PLT 219 10/20/2021   PROTIME: No results for input(s): LABPROT, INR in the last 72 hours. Chemistry No results for input(s): NA, K, CL, CO2, BUN, CREATININE, CALCIUM,  PROT, BILITOT, ALKPHOS, ALT, AST, GLUCOSE in the last 168 hours.  Invalid input(s): LABALBU Lipids Lab Results  Component Value Date   CHOL 240 (H) 12/08/2020   HDL 74 12/08/2020   LDLCALC 147 (H) 12/08/2020   TRIG 86 12/08/2020   BNP No results found for: PROBNP Thyroid Function Tests: No results for input(s): TSH, T4TOTAL, T3FREE, THYROIDAB in the last 72 hours.  Invalid input(s): FREET3 Miscellaneous No results found for: DDIMER  Radiology/Studies:  DG Abd 2 Views  Result Date: 10/20/2021 CLINICAL DATA:  Constipated for several days EXAM: ABDOMEN - 2 VIEW COMPARISON:  04/30/2014. FINDINGS: The bowel gas pattern is normal. No evidence of free air. No dilated loops of small bowel to suggest obstruction. Stool and gas are seen extending to the rectum. Surgical clips in the right lower quadrant. Imaged lung bases are clear. IMPRESSION: Negative. Electronically Signed   By: Merilyn Baba M.D.   On: 10/20/2021 21:36        Assessment and Plan:  Syncope-neurally mediated  Orthostatic lightheadedness  QT prolongation  The patient has stereotypical recurrent syncope.  The epiphenomena are strongly supportive as is the duration of the event of diagnosis of reflex syncope.  I think it is unrelated to the issue whether his QT is prolonged or not.  We discussed the event itself and the importance of recognizing the prodrome.  Moreover, we discussed orthostatic lightheadedness and the use of isometric contraction prior to standing to mitigate symptoms.  His QT is borderline on some occasions and normal in others.  He has  had no syncope to suggest long QT syndrome.  I have advised however to use QT drugs.org so as to avoid QT prolonging drugs.   We will see him again as needed   Wilhemina Bonito as a scribe for Kristopher Axe, MD.,have documented all relevant documentation on the behalf of Kristopher Axe, MD,as directed by  Kristopher Axe, MD while in the presence of Kristopher Axe, MD.  I, Kristopher Axe, MD, have reviewed all documentation for this visit. The documentation on 10/30/21 for the exam, diagnosis, procedures, and orders are all accurate and complete.   Kristopher Martinez

## 2021-11-02 DIAGNOSIS — H52203 Unspecified astigmatism, bilateral: Secondary | ICD-10-CM | POA: Diagnosis not present

## 2021-11-02 DIAGNOSIS — H5000 Unspecified esotropia: Secondary | ICD-10-CM | POA: Diagnosis not present

## 2021-11-02 DIAGNOSIS — H2513 Age-related nuclear cataract, bilateral: Secondary | ICD-10-CM | POA: Diagnosis not present

## 2021-12-09 ENCOUNTER — Other Ambulatory Visit: Payer: Self-pay | Admitting: Family Medicine

## 2021-12-11 NOTE — Telephone Encounter (Signed)
The original prescription was discontinued on 09/27/2021 by Levonne Spiller, RN for the following reason: Completed Course. Renewing this prescription may not be appropriate.

## 2021-12-14 ENCOUNTER — Encounter: Payer: Self-pay | Admitting: Family Medicine

## 2021-12-14 ENCOUNTER — Ambulatory Visit (INDEPENDENT_AMBULATORY_CARE_PROVIDER_SITE_OTHER): Payer: Medicare Other | Admitting: Family Medicine

## 2021-12-14 ENCOUNTER — Other Ambulatory Visit: Payer: Self-pay

## 2021-12-14 VITALS — BP 104/69 | HR 64 | Temp 97.9°F | Ht 70.0 in | Wt 162.2 lb

## 2021-12-14 DIAGNOSIS — Z0001 Encounter for general adult medical examination with abnormal findings: Secondary | ICD-10-CM

## 2021-12-14 DIAGNOSIS — E785 Hyperlipidemia, unspecified: Secondary | ICD-10-CM | POA: Diagnosis not present

## 2021-12-14 DIAGNOSIS — R739 Hyperglycemia, unspecified: Secondary | ICD-10-CM | POA: Diagnosis not present

## 2021-12-14 DIAGNOSIS — I4581 Long QT syndrome: Secondary | ICD-10-CM

## 2021-12-14 DIAGNOSIS — M1990 Unspecified osteoarthritis, unspecified site: Secondary | ICD-10-CM

## 2021-12-14 DIAGNOSIS — L219 Seborrheic dermatitis, unspecified: Secondary | ICD-10-CM

## 2021-12-14 LAB — COMPREHENSIVE METABOLIC PANEL
ALT: 33 U/L (ref 0–53)
AST: 24 U/L (ref 0–37)
Albumin: 4.4 g/dL (ref 3.5–5.2)
Alkaline Phosphatase: 77 U/L (ref 39–117)
BUN: 20 mg/dL (ref 6–23)
CO2: 30 mEq/L (ref 19–32)
Calcium: 9.4 mg/dL (ref 8.4–10.5)
Chloride: 103 mEq/L (ref 96–112)
Creatinine, Ser: 0.92 mg/dL (ref 0.40–1.50)
GFR: 81.47 mL/min (ref >60.00)
Glucose, Bld: 79 mg/dL (ref 70–99)
Potassium: 4.5 mEq/L (ref 3.5–5.1)
Sodium: 140 mEq/L (ref 135–145)
Total Bilirubin: 0.9 mg/dL (ref 0.2–1.2)
Total Protein: 7 g/dL (ref 6.0–8.3)

## 2021-12-14 LAB — LIPID PANEL
Cholesterol: 166 mg/dL (ref 0–200)
HDL: 84.6 mg/dL (ref >39.00)
LDL Cholesterol: 70 mg/dL (ref 0–99)
NonHDL: 81.06
Total CHOL/HDL Ratio: 2
Triglycerides: 53 mg/dL (ref 0.0–149.0)
VLDL: 10.6 mg/dL (ref 0.0–40.0)

## 2021-12-14 LAB — CBC
HCT: 46.8 % (ref 39.0–52.0)
Hemoglobin: 15.8 g/dL (ref 13.0–17.0)
MCHC: 33.8 g/dL (ref 30.0–36.0)
MCV: 95.1 fl (ref 78.0–100.0)
Platelets: 211 10*3/uL (ref 150.0–400.0)
RBC: 4.92 Mil/uL (ref 4.22–5.81)
RDW: 13.5 % (ref 11.5–15.5)
WBC: 6.7 10*3/uL (ref 4.0–10.5)

## 2021-12-14 LAB — HEMOGLOBIN A1C: Hgb A1c MFr Bld: 5.4 % (ref 4.6–6.5)

## 2021-12-14 LAB — TSH: TSH: 2.58 u[IU]/mL (ref 0.35–5.50)

## 2021-12-14 MED ORDER — HYDROCORTISONE-ACETIC ACID 1-2 % OT SOLN: 4.0000 [drp] | Freq: Four times a day (QID) | OTIC | 5 refills | Status: DC

## 2021-12-14 NOTE — Assessment & Plan Note (Signed)
Check A1c. 

## 2021-12-14 NOTE — Progress Notes (Signed)
Chief Complaint:  Kristopher Martinez is a 75 y.o. male who presents today for his annual comprehensive physical exam.    Assessment/Plan:  Chronic Problems Addressed Today: Hyperglycemia Check A1c.  Dyslipidemia Check lipids.  May need to increase dose of Lipitor depending on results.  Long Q-T syndrome Recently saw cardiology. No further intervention needed at this time.  Osteoarthritis Stable with topical Voltaren and compression.  Seborrheic dermatitis Continue acetic acid-hydrocortisone drops for dermatitis in bilateral EACs.  Preventative Healthcare: Will get blood work done today. UTD on colonoscopy. UTD on flu vaccine and pneumonia vaccine. Discussed shingles.   Patient Counseling(The following topics were reviewed and/or handout was given):  -Nutrition: Stressed importance of moderation in sodium/caffeine intake, saturated fat and cholesterol, caloric balance, sufficient intake of fresh fruits, vegetables, and fiber.  -Stressed the importance of regular exercise.   -Substance Abuse: Discussed cessation/primary prevention of tobacco, alcohol, or other drug use; driving or other dangerous activities under the influence; availability of treatment for abuse.   -Injury prevention: Discussed safety belts, safety helmets, smoke detector, smoking near bedding or upholstery.   -Sexuality: Discussed sexually transmitted diseases, partner selection, use of condoms, avoidance of unintended pregnancy and contraceptive alternatives.   -Dental health: Discussed importance of regular tooth brushing, flossing, and dental visits.  -Health maintenance and immunizations reviewed. Please refer to Health maintenance section.  Return to care in 1 year for next preventative visit.     Subjective:  HPI:  He has no acute complaints today.   He still have issue with dermatitis. Located in bilateral ears. He tried heat which seems to be helping. He notes hydrocortisone drops has been helping  with the symptoms. Left ear is more worse.   He is going under cataract surgery in February.  Lifestyle Diet: Balanced: plenty of fruits and vegetables. Exercise: Trying to exercise  Depression screen [facility] 2/9 12/14/2021  Decreased Interest 0  Down, Depressed, Hopeless 0  PHQ - 2 Score 0  Altered sleeping -  Tired, decreased energy -  Change in appetite -  Feeling bad or failure about yourself  -  Trouble concentrating -  Moving slowly or fidgety/restless -  Suicidal thoughts -  PHQ-9 Score -  Difficult doing work/chores -    There are no preventive care reminders to display for this patient.    ROS: Per HPI, otherwise a complete review of systems was negative.   PMH:  The following were reviewed and entered/updated in epic: Past Medical History:  Diagnosis Date   Flu    03/23/14     Hemorrhoid    Hyperlipidemia    IBS (irritable bowel syndrome)    Low back pain    Sinusitis    03/23/14   Syncope 1995   did all testing-post-prandal-pt.states was told had long QT segment   Patient Active Problem List   Diagnosis Date Noted   Hyperglycemia 12/08/2020   Long Q-T syndrome 06/07/2020   Osteoarthritis 06/03/2020   Insomnia 08/13/2019   Eczema 01/30/2019   Seborrheic dermatitis 01/30/2019   Erectile dysfunction 01/30/2019   Dyslipidemia 09/29/2007   Past Surgical History:  Procedure Laterality Date   APPENDECTOMY     COLONOSCOPY  2008   HYPERPLASTIC POLYPS X 8    COLONOSCOPY  02/05/2018   Dr.Stark   EYE SURGERY     strabismus as child   HYPERPLASIA TISSUE EXCISION     atypical lymphoid    INGUINAL HERNIA REPAIR Right 04/08/2014   Procedure: LAPAROSCOPIC RIGHT INGUINAL HERNIA  REPAIR;  Surgeon: Gayland Curry, MD;  Location: [facility];  Service: General;  Laterality: Right;   INSERTION OF MESH Right 04/08/2014   Procedure: INSERTION OF MESH;  Surgeon: Gayland Curry, MD;  Location: [facility];  Service: General;  Laterality: Right;   POLYPECTOMY     TONSILLECTOMY      WISDOM TOOTH EXTRACTION      Family History  Problem Relation Age of Onset   Colitis Mother    Alcohol abuse Mother    Cancer Father        prostate cancer   Prostate cancer Father    Prostate cancer Other    Colon cancer Neg Hx    Colon polyps Neg Hx    Esophageal cancer Neg Hx    Rectal cancer Neg Hx    Stomach cancer Neg Hx     Medications- reviewed and updated Current Outpatient Medications  Medication Sig Dispense Refill   atorvastatin (LIPITOR) 20 MG tablet Take 1 tablet (20 mg total) by mouth daily. 90 tablet 3   ibuprofen (ADVIL,MOTRIN) 200 MG tablet Take 200 mg by mouth every 6 (six) hours as needed for mild pain.     OVER THE COUNTER MEDICATION Take 1 tablet by mouth at bedtime. POWER TO SLEEP=MELATONIN     polyethylene glycol (MIRALAX / GLYCOLAX) 17 g packet Take 17 g by mouth daily as needed for moderate constipation. 14 each 0   tadalafil (CIALIS) 10 MG tablet Take 1 tablet (10 mg total) by mouth daily as needed for erectile dysfunction. 30 tablet 0   triamcinolone cream (KENALOG) 0.1 % Apply topically 2 (two) times daily. 60 g 0   acetic acid-hydrocortisone (VOSOL-HC) OTIC solution Place 4 drops into both ears 4 (four) times daily. 10 mL 5   No current facility-administered medications for this visit.    Allergies-reviewed and updated Allergies  Allergen Reactions   Augmentin [Amoxicillin-Pot Clavulanate] Other (See Comments)    Nausea, vomiting, dehydration had to go to hospital.   Amoxicillin    Trazodone And Nefazodone     Nausea and flushing. Possible passing out?   Cleocin [Clindamycin Hcl] Rash    Social History   Socioeconomic History   Marital status: Married    Spouse name: Not on file   Number of children: 0   Years of education: Not on file   Highest education level: Not on file  Occupational History   Not on file  Tobacco Use   Smoking status: Former    Types: Cigarettes    Quit date: 12/31/1976    Years since quitting: 44.9    Smokeless tobacco: Never  Vaping Use   Vaping Use: Never used  Substance and Sexual Activity   Alcohol use: Yes    Alcohol/week: 2.0 standard drinks    Types: 2 Glasses of wine per week   Drug use: No   Sexual activity: Yes  Other Topics Concern   Not on file  Social History Narrative   Not on file   Social Determinants of Health   Financial Resource Strain: Not on file  Food Insecurity: Not on file  Transportation Needs: Not on file  Physical Activity: Not on file  Stress: Not on file  Social Connections: Not on file        Objective:  Physical Exam: BP 104/69 (BP Location: Left Leg)    Pulse 64    Temp 97.9 F (36.6 C) (Temporal)    Ht 5\' 10"  (1.778 m)  Wt 162 lb 3.2 oz (73.6 kg)    SpO2 98%    BMI 23.27 kg/m   Body mass index is 23.27 kg/m. Wt Readings from Last 3 Encounters:  12/14/21 162 lb 3.2 oz (73.6 kg)  10/30/21 160 lb 9.6 oz (72.8 kg)  10/20/21 162 lb (73.5 kg)   Gen: NAD, resting comfortably HEENT: TMs normal bilaterally. OP clear. No thyromegaly noted.  CV: RRR with no murmurs appreciated Pulm: NWOB, CTAB with no crackles, wheezes, or rhonchi GI: Normal bowel sounds present. Soft, Nontender, Nondistended. MSK: no edema, cyanosis, or clubbing noted Skin: warm, dry Neuro: CN2-12 grossly intact. Strength 5/5 in upper and lower extremities. Reflexes symmetric and intact bilaterally.  Psych: Normal affect and thought content      I,Savera Zaman,acting as a scribe for Dimas Chyle, MD.,have documented all relevant documentation on the behalf of Dimas Chyle, MD,as directed by  Dimas Chyle, MD while in the presence of Dimas Chyle, MD.   I, Dimas Chyle, MD, have reviewed all documentation for this visit. The documentation on 12/14/21 for the exam, diagnosis, procedures, and orders are all accurate and complete.  [name]. Jerline Pain, MD 12/14/2021 9:44 AM

## 2021-12-14 NOTE — Assessment & Plan Note (Addendum)
Stable with topical Voltaren and compression.

## 2021-12-14 NOTE — Assessment & Plan Note (Signed)
Continue acetic acid-hydrocortisone drops for dermatitis in bilateral EACs.

## 2021-12-14 NOTE — Patient Instructions (Signed)
It was very nice to see you today!  We will check blood work today.  Please continue working on diet and exercise.  I will see you back in a year.  Come back to see me sooner if needed.  Take care, Dr Jerline Pain  PLEASE NOTE:  If you had any lab tests please let us know if you have not heard back within a few days. You may see your results on mychart before we have a chance to review them but we will give you a call once they are reviewed by [place]. If we ordered any referrals today, please let us know if you have not heard from their office within the next week.   Please try these tips to maintain a healthy lifestyle:  Eat at least 3 REAL meals and 1-2 snacks per day.  Aim for no more than 5 hours between eating.  If you eat breakfast, please do so within one hour of getting up.   Each meal should contain half fruits/vegetables, one quarter protein, and one quarter carbs (no bigger than a computer mouse)  Cut down on sweet beverages. This includes juice, soda, and sweet tea.   Drink at least 1 glass of water with each meal and aim for at least 8 glasses per day  Exercise at least 150 minutes every week.    Preventive Care 56 Years and Older, Male Preventive care refers to lifestyle choices and visits with your health care provider that can promote health and wellness. Preventive care visits are also called wellness exams. What can I expect for my preventive care visit? Counseling During your preventive care visit, your health care provider may ask about your: Medical history, including: Past medical problems. Family medical history. History of falls. Current health, including: Emotional well-being. Home life and relationship well-being. Sexual activity. Memory and ability to understand (cognition). Lifestyle, including: Alcohol, nicotine or tobacco, and drug use. Access to firearms. Diet, exercise, and sleep habits. Work and work Statistician. Sunscreen use. Safety issues such  as seatbelt and bike helmet use. Physical exam Your health care provider will check your: Height and weight. These may be used to calculate your BMI (body mass index). BMI is a measurement that tells if you are at a healthy weight. Waist circumference. This measures the distance around your waistline. This measurement also tells if you are at a healthy weight and may help predict your risk of certain diseases, such as type 2 diabetes and high blood pressure. Heart rate and blood pressure. Body temperature. Skin for abnormal spots. What immunizations do I need? Vaccines are usually given at various ages, according to a schedule. Your health care provider will recommend vaccines for you based on your age, medical history, and lifestyle or other factors, such as travel or where you work. What tests do I need? Screening Your health care provider may recommend screening tests for certain conditions. This may include: Lipid and cholesterol levels. Diabetes screening. This is done by checking your blood sugar (glucose) after you have not eaten for a while (fasting). Hepatitis C test. Hepatitis B test. HIV (human immunodeficiency virus) test. STI (sexually transmitted infection) testing, if you are at risk. Lung cancer screening. Colorectal cancer screening. Prostate cancer screening. Abdominal aortic aneurysm (AAA) screening. You may need this if you are a current or former smoker. Talk with your health care provider about your test results, treatment options, and if necessary, the need for more tests. Follow these instructions at home: Eating and drinking  Eat a diet that includes fresh fruits and vegetables, whole grains, lean protein, and low-fat dairy products. Limit your intake of foods with high amounts of sugar, saturated fats, and salt. Take vitamin and mineral supplements as recommended by your health care provider. Do not drink alcohol if your health care provider tells you not to  drink. If you drink alcohol: Limit how much you have to 0-2 drinks a day. Know how much alcohol is in your drink. In the [place] one drink equals one 12 oz bottle of beer (355 mL), one 5 oz glass of wine (148 mL), or one 1 oz glass of hard liquor (44 mL). Lifestyle Brush your teeth every morning and night with fluoride toothpaste. Floss one time each day. Exercise for at least 30 minutes 5 or more days each week. Do not use any products that contain nicotine or tobacco. These products include cigarettes, chewing tobacco, and vaping devices, such as e-cigarettes. If you need help quitting, ask your health care provider. Do not use drugs. If you are sexually active, practice safe sex. Use a condom or other form of protection to prevent STIs. Take aspirin only as told by your health care provider. Make sure that you understand how much to take and what form to take. Work with your health care provider to find out whether it is safe and beneficial for you to take aspirin daily. Ask your health care provider if you need to take a cholesterol-lowering medicine (statin). Find healthy ways to manage stress, such as: Meditation, yoga, or listening to music. Journaling. Talking to a trusted person. Spending time with friends and family. Safety Always wear your seat belt while driving or riding in a vehicle. Do not drive: If you have been drinking alcohol. Do not ride with someone who has been drinking. When you are tired or distracted. While texting. If you have been using any mind-altering substances or drugs. Wear a helmet and other protective equipment during sports activities. If you have firearms in your house, make sure you follow all gun safety procedures. Minimize exposure to UV radiation to reduce your risk of skin cancer. What's next? Visit your health care provider once a year for an annual wellness visit. Ask your health care provider how often you should have your eyes and teeth  checked. Stay up to date on all vaccines. This information is not intended to replace advice given to you by your health care provider. Make sure you discuss any questions you have with your health care provider. Document Revised: 06/14/2021 Document Reviewed: 06/14/2021 Elsevier Patient Education  [place].

## 2021-12-14 NOTE — Assessment & Plan Note (Signed)
Recently saw cardiology. No further intervention needed at this time.

## 2021-12-14 NOTE — Assessment & Plan Note (Addendum)
Check lipids.  May need to increase dose of Lipitor depending on results.

## 2021-12-15 NOTE — Progress Notes (Signed)
Please inform patient of the following:  Good news! His blood work is all NORMAL. His cholesterol is much better than last year. We should leave him on the same dose of lipitor. Do not need to make any other changes to his treatment plan at this time. We can recheck in a year.  Kristopher Martinez. Jerline Pain, MD 12/15/2021 8:05 AM

## 2021-12-18 ENCOUNTER — Ambulatory Visit (INDEPENDENT_AMBULATORY_CARE_PROVIDER_SITE_OTHER): Payer: Medicare Other

## 2021-12-18 DIAGNOSIS — Z0000 Encounter for general adult medical examination without abnormal findings: Secondary | ICD-10-CM | POA: Diagnosis not present

## 2021-12-18 NOTE — Progress Notes (Addendum)
Virtual Visit via Telephone Note  I connected with  Kristopher Martinez on 12/18/21 at 11:45 AM EST by telephone and verified that I am speaking with the correct person using two identifiers.  Medicare Annual Wellness visit completed telephonically due to Covid-19 pandemic.   Persons participating in this call: This Health Coach and this patient.   Location: Patient: home Provider: office   I discussed the limitations, risks, security and privacy concerns of performing an evaluation and management service by telephone and the availability of in person appointments. The patient expressed understanding and agreed to proceed.  Unable to perform video visit due to video visit attempted and failed and/or patient does not have video capability.   Some vital signs may be absent or patient reported.   Kristopher Brace, LPN   Subjective:   Kristopher Martinez is a 75 y.o. male who presents for Medicare Annual/Subsequent preventive examination.  Review of Systems     Cardiac Risk Factors include: advanced age (>55men, >66 women);dyslipidemia;male gender     Objective:    There were no vitals filed for this visit. There is no height or weight on file to calculate BMI.  Advanced Directives 12/18/2021 10/20/2021 10/11/2021 08/17/2019 02/05/2018 04/08/2014 03/17/2014  Does Patient Have a Medical Advance Directive? Yes No Yes No Yes Patient has advance directive, copy not in chart Patient has advance directive, copy not in chart  Type of Advance Directive Healthcare Power of [place];Living will - [place];Living will [place];Living will [place];Living will  Does patient want to make changes to medical advance directive? - No - Patient declined No - Patient declined - - - No change requested  Copy of [place] in Chart? No - copy requested - No - copy requested - Yes Copy requested from family  Copy requested from family  Would patient like information on creating a medical advance directive? - No - Patient declined - - - - -  Pre-existing out of facility DNR order (yellow form or pink MOST form) - - - - - - No    Current Medications (verified) Outpatient Encounter Medications as of 12/18/2021  Medication Sig   acetic acid-hydrocortisone (VOSOL-HC) OTIC solution Place 4 drops into both ears 4 (four) times daily.   atorvastatin (LIPITOR) 20 MG tablet Take 1 tablet (20 mg total) by mouth daily.   ibuprofen (ADVIL,MOTRIN) 200 MG tablet Take 200 mg by mouth every 6 (six) hours as needed for mild pain.   OVER THE COUNTER MEDICATION Take 1 tablet by mouth at bedtime. MELATONIN   polyethylene glycol (MIRALAX / GLYCOLAX) 17 g packet Take 17 g by mouth daily as needed for moderate constipation.   tadalafil (CIALIS) 10 MG tablet Take 1 tablet (10 mg total) by mouth daily as needed for erectile dysfunction.   triamcinolone cream (KENALOG) 0.1 % Apply topically 2 (two) times daily.   No facility-administered encounter medications on file as of 12/18/2021.    Allergies (verified) Augmentin [amoxicillin-pot clavulanate], Amoxicillin, Trazodone and nefazodone, and Cleocin [clindamycin hcl]   History: Past Medical History:  Diagnosis Date   Flu    03/23/14     Hemorrhoid    Hyperlipidemia    IBS (irritable bowel syndrome)    Low back pain    Sinusitis    03/23/14   Syncope 1995   did all testing-post-prandal-pt.states was told had long QT segment   Past Surgical History:  Procedure Laterality Date   APPENDECTOMY     COLONOSCOPY  2008   HYPERPLASTIC POLYPS X 8    COLONOSCOPY  02/05/2018   Dr.Stark   EYE SURGERY     strabismus as child   HYPERPLASIA TISSUE EXCISION     atypical lymphoid    INGUINAL HERNIA REPAIR Right 04/08/2014   Procedure: LAPAROSCOPIC RIGHT INGUINAL HERNIA REPAIR;  Surgeon: Gayland Curry, MD;  Location: [facility];  Service: General;  Laterality: Right;    INSERTION OF MESH Right 04/08/2014   Procedure: INSERTION OF MESH;  Surgeon: Gayland Curry, MD;  Location: [facility];  Service: General;  Laterality: Right;   POLYPECTOMY     TONSILLECTOMY     WISDOM TOOTH EXTRACTION     Family History  Problem Relation Age of Onset   Colitis Mother    Alcohol abuse Mother    Cancer Father        prostate cancer   Prostate cancer Father    Prostate cancer Other    Colon cancer Neg Hx    Colon polyps Neg Hx    Esophageal cancer Neg Hx    Rectal cancer Neg Hx    Stomach cancer Neg Hx    Social History   Socioeconomic History   Marital status: Married    Spouse name: Not on file   Number of children: 0   Years of education: Not on file   Highest education level: Not on file  Occupational History   Not on file  Tobacco Use   Smoking status: Former    Types: Cigarettes    Quit date: 12/31/1976    Years since quitting: 44.9   Smokeless tobacco: Never  Vaping Use   Vaping Use: Never used  Substance and Sexual Activity   Alcohol use: Yes    Alcohol/week: 2.0 standard drinks    Types: 2 Glasses of wine per week   Drug use: No   Sexual activity: Yes  Other Topics Concern   Not on file  Social History Narrative   Not on file   Social Determinants of Health   Financial Resource Strain: Not on file  Food Insecurity: Not on file  Transportation Needs: Not on file  Physical Activity: Insufficiently Active   Days of Exercise per Week: 2 days   Minutes of Exercise per Session: 40 min  Stress: No Stress Concern Present   Feeling of Stress : Not at all  Social Connections: Moderately Isolated   Frequency of Communication with Friends and Family: Once a week   Frequency of Social Gatherings with Friends and Family: More than three times a week   Attends Religious Services: Never   Marine scientist or Organizations: No   Attends Music therapist: Never   Marital Status: Married    Tobacco Counseling Counseling given: Not  Answered   Clinical Intake:  Pre-visit preparation completed: Yes  Pain : No/denies pain     BMI - recorded: 23.04 Nutritional Status: BMI of 19-24  Normal Nutritional Risks: None Diabetes: No  How often do you need to have someone help you when you read instructions, pamphlets, or other written materials from your doctor or pharmacy?: 1 - Never  Diabetic?no  Interpreter Needed?: No  Information entered by :: Charlott Rakes , LPN   Activities of Daily Living In your present state of health, do you have any difficulty performing the following activities: 12/18/2021  Hearing? Y  Comment wears hearing aids  Vision?  N  Difficulty concentrating or making decisions? N  Walking or climbing stairs? N  Dressing or bathing? N  Doing errands, shopping? N  Preparing Food and eating ? N  Using the Toilet? N  In the past six months, have you accidently leaked urine? N  Do you have problems with loss of bowel control? N  Managing your Medications? N  Managing your Finances? N  Housekeeping or managing your Housekeeping? N  Some recent data might be hidden    Patient Care Team: Vivi Barrack, MD as PCP - General (Family Medicine)  Indicate any recent Medical Services you may have received from other than [facility] providers in the past year (date may be approximate).     Assessment:   This is a routine wellness examination for Izzy.  Hearing/Vision screen Hearing Screening - Comments:: Pt has tinnitus and wears a hearing aid  Vision Screening - Comments:: Pt follows up with Dr Macarthur Critchley   Dietary issues and exercise activities discussed: Current Exercise Habits: Home exercise routine, Type of exercise: walking, Time (Minutes): 45, Frequency (Times/Week): 2, Weekly Exercise (Minutes/Week): 90   Goals Addressed             This Visit's Progress    Patient Stated       None at this time        Depression Screen PHQ 2/9 Scores 12/18/2021 12/14/2021 04/05/2021  12/08/2020 12/08/2020 11/06/2018 09/20/2016  PHQ - 2 Score 0 0 0 0 0 0 0  PHQ- 9 Score - - 2 0 - - -    Fall Risk Fall Risk  12/18/2021 12/14/2021 12/08/2020 11/06/2018 09/20/2016  Falls in the past year? 0 0 0 0 No  Number falls in past yr: 0 0 - - -  Injury with Fall? 0 0 - - -  Risk for fall due to : Impaired vision - - - -  Follow up Falls prevention discussed - - - -    FALL RISK PREVENTION PERTAINING TO THE HOME:  Any stairs in or around the home? Yes  If so, are there any without handrails? No  Home free of loose throw rugs in walkways, pet beds, electrical cords, etc? Yes  Adequate lighting in your home to reduce risk of falls? Yes   ASSISTIVE DEVICES UTILIZED TO PREVENT FALLS:  Life alert? No  Use of a cane, walker or w/c? No  Grab bars in the bathroom? No  Shower chair or bench in shower? No  Elevated toilet seat or a handicapped toilet? No   TIMED UP AND GO:  Was the test performed? No .    Cognitive Function:     6CIT Screen 12/18/2021  What Year? 0 points  What month? 0 points  What time? 0 points  Count back from 20 0 points  Months in reverse 0 points  Repeat phrase 0 points  Total Score 0    Immunizations Immunization History  Administered Date(s) Administered   Fluad Quad(high Dose 65+) 08/27/2019, 10/18/2020   Influenza Split 12/27/2011, 12/04/2012   Influenza, High Dose Seasonal PF 10/08/2013, 11/16/2014, 12/14/2015, 09/20/2016, 09/25/2017, 10/15/2018   Influenza,inj,Quad PF,6+ Mos 09/11/2021   Moderna Covid-19 Vaccine Bivalent Booster 23yrs & up 11/07/2021   Moderna Sars-Covid-2 Vaccination 01/30/2020, 02/29/2020, 10/28/2020   Pneumococcal Conjugate-13 07/15/2014   Pneumococcal Polysaccharide-23 06/05/2013, 11/16/2019   Td 01/01/2004   Tdap 08/17/2019   Tetanus 07/15/2014    TDAP status: Up to date  Flu Vaccine status: Up to date  Pneumococcal  vaccine status: Up to date  Covid-19 vaccine status: Completed vaccines  Qualifies for  Shingles Vaccine? Yes   Zostavax completed No   Shingrix Completed?: No.    Education has been provided regarding the importance of this vaccine. Patient has been advised to call insurance company to determine out of pocket expense if they have not yet received this vaccine. Advised may also receive vaccine at local pharmacy or Health Dept. Verbalized acceptance and understanding.  Screening Tests Health Maintenance  Topic Date Due   COLONOSCOPY (Pts 45-25yrs Insurance coverage will need to be confirmed)  10/11/2024   TETANUS/TDAP  08/16/2029   Pneumonia Vaccine 36+ Years old  Completed   INFLUENZA VACCINE  Completed   COVID-19 Vaccine  Completed   Hepatitis C Screening  Completed   HPV VACCINES  Aged Out   Zoster Vaccines- Shingrix  Discontinued    Health Maintenance  There are no preventive care reminders to display for this patient.  Colorectal cancer screening: Type of screening: Colonoscopy. Completed 10/11/21. Repeat every 3 years  Additional Screening:  Hepatitis C Screening: Completed 07/15/14  Vision Screening: Recommended annual ophthalmology exams for early detection of glaucoma and other disorders of the eye. Is the patient up to date with their annual eye exam?  Yes  Who is the provider or what is the name of the office in which the patient attends annual eye exams? Dr Macarthur Critchley  If pt is not established with a provider, would they like to be referred to a provider to establish care? No .   Dental Screening: Recommended annual dental exams for proper oral hygiene  Community Resource Referral / Chronic Care Management: CRR required this visit?  No   CCM required this visit?  No      Plan:     I have personally reviewed and noted the following in the patients chart:   Medical and social history Use of alcohol, tobacco or illicit drugs  Current medications and supplements including opioid prescriptions. Patient is not currently taking opioid  prescriptions. Functional ability and status Nutritional status Physical activity Advanced directives List of other physicians Hospitalizations, surgeries, and ER visits in previous 12 months Vitals Screenings to include cognitive, depression, and falls Referrals and appointments  In addition, I have reviewed and discussed with patient certain preventive protocols, quality metrics, and best practice recommendations. A written personalized care plan for preventive services as well as general preventive health recommendations were provided to patient.     Kristopher Brace, LPN   [id number]   Nurse Notes: None

## 2021-12-18 NOTE — Patient Instructions (Signed)
Kristopher Martinez , Thank you for taking time to come for your Medicare Wellness Visit. I appreciate your ongoing commitment to your health goals. Please review the following plan we discussed and let me know if I can assist you in the future.   Screening recommendations/referrals: Colonoscopy: Done 10/11/21 repeat  every 3 years  Recommended yearly ophthalmology/optometry visit for glaucoma screening and checkup Recommended yearly dental visit for hygiene and checkup  Vaccinations: Influenza vaccine: done 09/11/21 repeat every year  Pneumococcal vaccine: Up to date Tdap vaccine: done 08/17/19 repeat every 10 year Shingles vaccine: Shingrix discussed. Please contact your pharmacy for coverage information.    Covid-19: completed   Advanced directives: Please bring a copy of your health care power of attorney and living will to the office at your convenience.  Conditions/risks identified: None at this time   Next appointment: Follow up in one year for your annual wellness visit   Preventive Care 40-64 Years, Male Preventive care refers to lifestyle choices and visits with your health care provider that can promote health and wellness. What does preventive care include? A yearly physical exam. This is also called an annual well check. Dental exams once or twice a year. Routine eye exams. Ask your health care provider how often you should have your eyes checked. Personal lifestyle choices, including: Daily care of your teeth and gums. Regular physical activity. Eating a healthy diet. Avoiding tobacco and drug use. Limiting alcohol use. Practicing safe sex. Taking low-dose aspirin every day starting at age 62. What happens during an annual well check? The services and screenings done by your health care provider during your annual well check will depend on your age, overall health, lifestyle risk factors, and family history of disease. Counseling  Your health care provider may ask you  questions about your: Alcohol use. Tobacco use. Drug use. Emotional well-being. Home and relationship well-being. Sexual activity. Eating habits. Work and work Statistician. Screening  You may have the following tests or measurements: Height, weight, and BMI. Blood pressure. Lipid and cholesterol levels. These may be checked every 5 years, or more frequently if you are over 38 years old. Skin check. Lung cancer screening. You may have this screening every year starting at age 41 if you have a 30-pack-year history of smoking and currently smoke or have quit within the past 15 years. Fecal occult blood test (FOBT) of the stool. You may have this test every year starting at age 50. Flexible sigmoidoscopy or colonoscopy. You may have a sigmoidoscopy every 5 years or a colonoscopy every 10 years starting at age 40. Prostate cancer screening. Recommendations will vary depending on your family history and other risks. Hepatitis C blood test. Hepatitis B blood test. Sexually transmitted disease (STD) testing. Diabetes screening. This is done by checking your blood sugar (glucose) after you have not eaten for a while (fasting). You may have this done every 1-3 years. Discuss your test results, treatment options, and if necessary, the need for more tests with your health care provider. Vaccines  Your health care provider may recommend certain vaccines, such as: Influenza vaccine. This is recommended every year. Tetanus, diphtheria, and acellular pertussis (Tdap, Td) vaccine. You may need a Td booster every 10 years. Zoster vaccine. You may need this after age 77. Pneumococcal 13-valent conjugate (PCV13) vaccine. You may need this if you have certain conditions and have not been vaccinated. Pneumococcal polysaccharide (PPSV23) vaccine. You may need one or two doses if you smoke cigarettes or if you  have certain conditions. Talk to your health care provider about which screenings and vaccines you  need and how often you need them. This information is not intended to replace advice given to you by your health care provider. Make sure you discuss any questions you have with your health care provider. Document Released: 01/13/2016 Document Revised: 09/05/2016 Document Reviewed: 10/18/2015 Elsevier Interactive Patient Education  2017 [place] Prevention in the Home Falls can cause injuries. They can happen to people of all ages. There are many things you can do to make your home safe and to help prevent falls. What can I do on the outside of my home? Regularly fix the edges of walkways and driveways and fix any cracks. Remove anything that might make you trip as you walk through a door, such as a raised step or threshold. Trim any bushes or trees on the path to your home. Use bright outdoor lighting. Clear any walking paths of anything that might make someone trip, such as rocks or tools. Regularly check to see if handrails are loose or broken. Make sure that both sides of any steps have handrails. Any raised decks and porches should have guardrails on the edges. Have any leaves, snow, or ice cleared regularly. Use sand or salt on walking paths during winter. Clean up any spills in your garage right away. This includes oil or grease spills. What can I do in the bathroom? Use night lights. Install grab bars by the toilet and in the tub and shower. Do not use towel bars as grab bars. Use non-skid mats or decals in the tub or shower. If you need to sit down in the shower, use a plastic, non-slip stool. Keep the floor dry. Clean up any water that spills on the floor as soon as it happens. Remove soap buildup in the tub or shower regularly. Attach bath mats securely with double-sided non-slip rug tape. Do not have throw rugs and other things on the floor that can make you trip. What can I do in the bedroom? Use night lights. Make sure that you have a light by your bed that is  easy to reach. Do not use any sheets or blankets that are too big for your bed. They should not hang down onto the floor. Have a firm chair that has side arms. You can use this for support while you get dressed. Do not have throw rugs and other things on the floor that can make you trip. What can I do in the kitchen? Clean up any spills right away. Avoid walking on wet floors. Keep items that you use a lot in easy-to-reach places. If you need to reach something above you, use a strong step stool that has a grab bar. Keep electrical cords out of the way. Do not use floor polish or wax that makes floors slippery. If you must use wax, use non-skid floor wax. Do not have throw rugs and other things on the floor that can make you trip. What can I do with my stairs? Do not leave any items on the stairs. Make sure that there are handrails on both sides of the stairs and use them. Fix handrails that are broken or loose. Make sure that handrails are as long as the stairways. Check any carpeting to make sure that it is firmly attached to the stairs. Fix any carpet that is loose or worn. Avoid having throw rugs at the top or bottom of the stairs. If you  do have throw rugs, attach them to the floor with carpet tape. Make sure that you have a light switch at the top of the stairs and the bottom of the stairs. If you do not have them, ask someone to add them for you. What else can I do to help prevent falls? Wear shoes that: Do not have high heels. Have rubber bottoms. Are comfortable and fit you well. Are closed at the toe. Do not wear sandals. If you use a stepladder: Make sure that it is fully opened. Do not climb a closed stepladder. Make sure that both sides of the stepladder are locked into place. Ask someone to hold it for you, if possible. Clearly mark and make sure that you can see: Any grab bars or handrails. First and last steps. Where the edge of each step is. Use tools that help you  move around (mobility aids) if they are needed. These include: Canes. Walkers. Scooters. Crutches. Turn on the lights when you go into a dark area. Replace any light bulbs as soon as they burn out. Set up your furniture so you have a clear path. Avoid moving your furniture around. If any of your floors are uneven, fix them. If there are any pets around you, be aware of where they are. Review your medicines with your doctor. Some medicines can make you feel dizzy. This can increase your chance of falling. Ask your doctor what other things that you can do to help prevent falls. This information is not intended to replace advice given to you by your health care provider. Make sure you discuss any questions you have with your health care provider. Document Released: 10/13/2009 Document Revised: 05/24/2016 Document Reviewed: 01/21/2015 Elsevier Interactive Patient Education  2017 [employer].

## 2022-01-17 ENCOUNTER — Other Ambulatory Visit: Payer: Self-pay | Admitting: Family Medicine

## 2022-01-22 ENCOUNTER — Encounter: Payer: Self-pay | Admitting: Family Medicine

## 2022-01-22 ENCOUNTER — Other Ambulatory Visit: Payer: Self-pay | Admitting: Family Medicine

## 2022-01-22 NOTE — Telephone Encounter (Signed)
Spoke with patient, patient notified Rx was send in on 01/17/2022 Patient stated will call pharmacy for refills  If any question will call back

## 2022-01-25 DIAGNOSIS — H2512 Age-related nuclear cataract, left eye: Secondary | ICD-10-CM | POA: Diagnosis not present

## 2022-02-08 DIAGNOSIS — H2511 Age-related nuclear cataract, right eye: Secondary | ICD-10-CM | POA: Diagnosis not present

## 2022-02-12 DIAGNOSIS — D225 Melanocytic nevi of trunk: Secondary | ICD-10-CM | POA: Diagnosis not present

## 2022-02-12 DIAGNOSIS — L2089 Other atopic dermatitis: Secondary | ICD-10-CM | POA: Diagnosis not present

## 2022-02-12 DIAGNOSIS — L570 Actinic keratosis: Secondary | ICD-10-CM | POA: Diagnosis not present

## 2022-02-12 DIAGNOSIS — L821 Other seborrheic keratosis: Secondary | ICD-10-CM | POA: Diagnosis not present

## 2022-02-12 DIAGNOSIS — L819 Disorder of pigmentation, unspecified: Secondary | ICD-10-CM | POA: Diagnosis not present

## 2022-02-12 DIAGNOSIS — L814 Other melanin hyperpigmentation: Secondary | ICD-10-CM | POA: Diagnosis not present

## 2022-02-12 DIAGNOSIS — L218 Other seborrheic dermatitis: Secondary | ICD-10-CM | POA: Diagnosis not present

## 2022-02-23 ENCOUNTER — Other Ambulatory Visit: Payer: Self-pay | Admitting: Family Medicine

## 2022-02-27 ENCOUNTER — Other Ambulatory Visit: Payer: Self-pay | Admitting: Family Medicine

## 2022-03-11 ENCOUNTER — Encounter: Payer: Self-pay | Admitting: Family Medicine

## 2022-04-17 ENCOUNTER — Other Ambulatory Visit: Payer: Self-pay | Admitting: Family Medicine

## 2022-04-18 ENCOUNTER — Ambulatory Visit (INDEPENDENT_AMBULATORY_CARE_PROVIDER_SITE_OTHER): Payer: Medicare Other | Admitting: Family Medicine

## 2022-04-18 ENCOUNTER — Encounter: Payer: Self-pay | Admitting: Family Medicine

## 2022-04-18 VITALS — BP 119/78 | HR 70 | Temp 98.1°F | Ht 70.0 in | Wt 162.6 lb

## 2022-04-18 DIAGNOSIS — D3617 Benign neoplasm of peripheral nerves and autonomic nervous system of trunk, unspecified: Secondary | ICD-10-CM | POA: Diagnosis not present

## 2022-04-18 DIAGNOSIS — L989 Disorder of the skin and subcutaneous tissue, unspecified: Secondary | ICD-10-CM

## 2022-04-18 MED ORDER — ATORVASTATIN CALCIUM 20 MG PO TABS: 20.0000 mg | ORAL_TABLET | Freq: Every day | ORAL | 1 refills | Status: DC

## 2022-04-18 NOTE — Progress Notes (Signed)
? ?  Kristopher Martinez is a 76 y.o. male who presents today for an office visit. ? ?Assessment/Plan:  ?Skin Lesion ?Shave biopsy performed below.  See below procedure note.  He tolerated well.  We discussed care after and reasons to return to care. ? ?  ?Subjective:  ?HPI: ? ?Patient here with cyst on back. This has been present for several years. Seems to be getting bigger. Went to dermatologist a few months ago and was told it was benign.  He would like to have this removed.  No pain. ? ?   ?  ?Objective:  ?Physical Exam: ?BP 119/78   Pulse 70   Temp 98.1 ?F (36.7 ?C) (Temporal)   Ht '5\' 10"'$  (1.778 m)   Wt 162 lb 9.6 oz (73.8 kg)   SpO2 98%   BMI 23.33 kg/m?   ?Gen: No acute distress, resting comfortably ?CV: Regular rate and rhythm with no murmurs appreciated ?Pulm: Normal work of breathing, clear to auscultation bilaterally with no crackles, wheezes, or rhonchi ?Skin: Approximately 1 cm pedunculated cystic lesion on right mid back.  ?Neuro: Grossly normal, moves all extremities ?Psych: Normal affect and thought content ? ?Shave Biopsy Procedure Note ? ?Pre-operative Diagnosis: Suspicious lesion ? ?Post-operative Diagnosis: same ? ?Locations:right Mid back ? ?Indications: Diagnostic ? ?Anesthesia: Lidocaine 1% with epinephrine without added sodium bicarbonate ? ?Procedure Details  ?History of allergy to iodine: no ? ?Patient informed of the risks (including bleeding and infection) and benefits of the  ?procedure and Verbal informed consent obtained. ? ?The lesion and surrounding area were given a sterile prep using betadyne and draped in the usual sterile fashion. A scalpel was used to shave an area of skin approximately 1cm by 1cm.  Hemostasis achieved with pressure and silver nitrate. Antibiotic ointment and a sterile dressing applied.  The specimen was sent for pathologic examination. The patient tolerated the procedure well. ? ?EBL: 3 ml ? ?Findings: ?Suspicious skin  lesion ? ?Condition: ?Stable ? ?Complications: ?none. ? ?Plan: ?1. Instructed to keep the wound dry and covered for 24-48h and clean thereafter. ?2. Warning signs of infection were reviewed.   ?3. Recommended that the patient use OTC analgesics as needed for pain.  ? ?   ? ?[name]. Jerline Pain, MD ?04/18/2022 9:32 AM  ?

## 2022-04-18 NOTE — Patient Instructions (Signed)
It was very nice to see you today! ? ?We removed the lesion on your back.  I think this is benign but we will send for a pathology report to confirm this. ? ?Please keep the area clean and dry for the next 24 to 48 hours.  Let me know if you have any signs concerning for infection including worsening pain, redness, or purulent discharge. ? ?Take care, ?Dr Jerline Pain ? ?PLEASE NOTE: ? ?If you had any lab tests please let us know if you have not heard back within a few days. You may see your results on mychart before we have a chance to review them but we will give you a call once they are reviewed by [place]. If we ordered any referrals today, please let us know if you have not heard from their office within the next week.  ? ?Please try these tips to maintain a healthy lifestyle: ? ?Eat at least 3 REAL meals and 1-2 snacks per day.  Aim for no more than 5 hours between eating.  If you eat breakfast, please do so within one hour of getting up.  ? ?Each meal should contain half fruits/vegetables, one quarter protein, and one quarter carbs (no bigger than a computer mouse) ? ?Cut down on sweet beverages. This includes juice, soda, and sweet tea.  ? ?Drink at least 1 glass of water with each meal and aim for at least 8 glasses per day ? ?Exercise at least 150 minutes every week.   ?

## 2022-04-23 ENCOUNTER — Other Ambulatory Visit: Payer: Self-pay | Admitting: Family Medicine

## 2022-04-26 NOTE — Progress Notes (Signed)
Please inform patient of the following: ? ?His pathology report showed a benign lesion called a neurofibroma. We do not need to do anything else at this point but there is a chance it could come back in the future.  We can do a complete excision if it does recur.

## 2022-05-20 ENCOUNTER — Emergency Department (HOSPITAL_BASED_OUTPATIENT_CLINIC_OR_DEPARTMENT_OTHER): Payer: Medicare Other

## 2022-05-20 ENCOUNTER — Other Ambulatory Visit: Payer: Self-pay

## 2022-05-20 ENCOUNTER — Emergency Department (HOSPITAL_BASED_OUTPATIENT_CLINIC_OR_DEPARTMENT_OTHER)
Admission: EM | Admit: 2022-05-20 | Discharge: 2022-05-20 | Disposition: A | Discharge status: Home / Self Care | Payer: Medicare Other | Attending: Emergency Medicine | Admitting: Emergency Medicine

## 2022-05-20 DIAGNOSIS — R079 Chest pain, unspecified: Secondary | ICD-10-CM | POA: Diagnosis not present

## 2022-05-20 DIAGNOSIS — I700 Atherosclerosis of aorta: Secondary | ICD-10-CM | POA: Diagnosis not present

## 2022-05-20 DIAGNOSIS — K449 Diaphragmatic hernia without obstruction or gangrene: Secondary | ICD-10-CM | POA: Diagnosis not present

## 2022-05-20 DIAGNOSIS — I959 Hypotension, unspecified: Secondary | ICD-10-CM

## 2022-05-20 DIAGNOSIS — R112 Nausea with vomiting, unspecified: Secondary | ICD-10-CM

## 2022-05-20 DIAGNOSIS — R55 Syncope and collapse: Secondary | ICD-10-CM | POA: Diagnosis not present

## 2022-05-20 DIAGNOSIS — R1013 Epigastric pain: Secondary | ICD-10-CM

## 2022-05-20 LAB — URINALYSIS, ROUTINE W REFLEX MICROSCOPIC
Bilirubin Urine: NEGATIVE
Glucose, UA: NEGATIVE mg/dL
Hgb urine dipstick: NEGATIVE
Ketones, ur: 15 mg/dL — AB
Leukocytes,Ua: NEGATIVE
Nitrite: NEGATIVE
Protein, ur: NEGATIVE mg/dL
Specific Gravity, Urine: 1.046 — ABNORMAL HIGH (ref 1.005–1.030)
pH: 8 (ref 5.0–8.0)

## 2022-05-20 LAB — CBC
HCT: 48.7 % (ref 39.0–52.0)
Hemoglobin: 16.8 g/dL (ref 13.0–17.0)
MCH: 31.8 pg (ref 26.0–34.0)
MCHC: 34.5 g/dL (ref 30.0–36.0)
MCV: 92.2 fL (ref 80.0–100.0)
Platelets: 243 10*3/uL (ref 150–400)
RBC: 5.28 MIL/uL (ref 4.22–5.81)
RDW: 13.2 % (ref 11.5–15.5)
WBC: 10.5 10*3/uL (ref 4.0–10.5)
nRBC: 0 % (ref 0.0–0.2)

## 2022-05-20 LAB — COMPREHENSIVE METABOLIC PANEL
ALT: 35 U/L (ref 0–44)
AST: 23 U/L (ref 15–41)
Albumin: 4.6 g/dL (ref 3.5–5.0)
Alkaline Phosphatase: 56 U/L (ref 38–126)
Anion gap: 19 — ABNORMAL HIGH (ref 5–15)
BUN: 22 mg/dL (ref 8–23)
CO2: 20 mmol/L — ABNORMAL LOW (ref 22–32)
Calcium: 9.5 mg/dL (ref 8.9–10.3)
Chloride: 103 mmol/L (ref 98–111)
Creatinine, Ser: 1.07 mg/dL (ref 0.61–1.24)
GFR, Estimated: >60 mL/min (ref >60)
Glucose, Bld: 125 mg/dL — ABNORMAL HIGH (ref 70–99)
Potassium: 3.7 mmol/L (ref 3.5–5.1)
Sodium: 142 mmol/L (ref 135–145)
Total Bilirubin: 1.2 mg/dL (ref 0.3–1.2)
Total Protein: 7.6 g/dL (ref 6.5–8.1)

## 2022-05-20 LAB — LACTIC ACID, PLASMA
Lactic Acid, Venous: 4 mmol/L (ref 0.5–1.9)
Lactic Acid, Venous: 1.8 mmol/L (ref 0.5–1.9)

## 2022-05-20 LAB — TROPONIN I (HIGH SENSITIVITY)
Troponin I (High Sensitivity): 7 ng/L (ref <18)
Troponin I (High Sensitivity): 6 ng/L (ref <18)

## 2022-05-20 LAB — LIPASE, BLOOD: Lipase: 20 U/L (ref 11–51)

## 2022-05-20 MED ORDER — SODIUM CHLORIDE 0.9 % IV BOLUS
1000.0000 mL | Freq: Once | INTRAVENOUS | Status: AC
  Administered 2022-05-20: 1000 mL via INTRAVENOUS

## 2022-05-20 MED ORDER — ONDANSETRON HCL 4 MG/2ML IJ SOLN
4.0000 mg | Freq: Once | INTRAMUSCULAR | Status: AC
  Administered 2022-05-20: 4 mg via INTRAVENOUS
  Filled 2022-05-20: qty 2

## 2022-05-20 MED ORDER — IOHEXOL 350 MG/ML SOLN
100.0000 mL | Freq: Once | INTRAVENOUS | Status: AC | PRN
  Administered 2022-05-20: 100 mL via INTRAVENOUS

## 2022-05-20 MED ORDER — FENTANYL CITRATE PF 50 MCG/ML IJ SOSY
50.0000 ug | PREFILLED_SYRINGE | Freq: Once | INTRAMUSCULAR | Status: AC
  Administered 2022-05-20: 50 ug via INTRAVENOUS
  Filled 2022-05-20: qty 1

## 2022-05-20 NOTE — ED Notes (Signed)
Second blood cultures were discontinued by ER physician.

## 2022-05-20 NOTE — Discharge Instructions (Signed)
Your work-up today revealed evidence of a small hiatal hernia, possible ileus or enteritis but otherwise did not show significant findings.  You are likely dehydrated with the ketones in the urine and elevated lactic acid but that all improved after fluids and medications.  He has demonstrated stability for over 5 hours and after our shared decision-making conversation, he would like to go home.  Given your well appearance and understanding of your work-up, we feel this is reasonable.  Please follow-up with both your gastroenterologist and a general surgeon for the hiatal hernia and stomach pains.  If any symptoms change or worsen acutely again, please return to the nearest emergency department.

## 2022-05-20 NOTE — ED Notes (Signed)
1st set of cultures was able to be obtained. 2nd set was not due to access.

## 2022-05-20 NOTE — ED Provider Notes (Signed)
[facility] Provider Note   CSN: [id number] Arrival date & time: 05/20/22  1014     History  No chief complaint on file.   Kristopher Martinez is a 76 y.o. male.  The history is provided by the patient, the spouse and medical records. No language interpreter was used.  Abdominal Pain Pain location:  Epigastric, RUQ and RLQ Pain quality: sharp   Pain radiates to:  Back Pain severity:  Severe Onset quality:  Sudden Duration:  5 hours Timing:  Constant Progression:  Waxing and waning Chronicity:  New Context: not trauma   Relieved by:  Nothing Worsened by:  Palpation Ineffective treatments:  None tried Associated symptoms: diarrhea (one loose stool), fatigue, nausea and vomiting   Associated symptoms: no chest pain, no chills, no constipation, no cough, no dysuria, no fever and no shortness of breath       Home Medications Prior to Admission medications   Medication Sig Start Date End Date Taking? Authorizing Provider  acetic acid-hydrocortisone (VOSOL-HC) OTIC solution Place 4 drops into both ears 4 (four) times daily. 12/14/21   Vivi Barrack, MD  atorvastatin (LIPITOR) 20 MG tablet Take 1 tablet (20 mg total) by mouth daily. 04/18/22   Vivi Barrack, MD  ibuprofen (ADVIL,MOTRIN) 200 MG tablet Take 200 mg by mouth every 6 (six) hours as needed for mild pain.    [provider]  OVER THE COUNTER MEDICATION Take 1 tablet by mouth at bedtime. MELATONIN    [provider]  polyethylene glycol (MIRALAX / GLYCOLAX) 17 g packet Take 17 g by mouth daily as needed for moderate constipation. 10/20/21   Davonna Belling, MD  tadalafil (CIALIS) 10 MG tablet TAKE ONE TABLET BY MOUTH DAILY AS NEEDED FOR ERECTILE DYSFUNCTION 02/23/22   Vivi Barrack, MD  triamcinolone cream (KENALOG) 0.1 % APPLY TOPICALLY TWICE A DAY 04/23/22   Vivi Barrack, MD      Allergies    Augmentin [amoxicillin-pot clavulanate], Amoxicillin, Trazodone and  nefazodone, and Cleocin [clindamycin hcl]    Review of Systems   Review of Systems  Constitutional:  Positive for diaphoresis and fatigue. Negative for chills and fever.  HENT:  Negative for congestion.   Respiratory:  Negative for cough, chest tightness, shortness of breath and wheezing.   Cardiovascular:  Negative for chest pain, palpitations and leg swelling.  Gastrointestinal:  Positive for abdominal pain, diarrhea (one loose stool), nausea and vomiting. Negative for abdominal distention and constipation.  Genitourinary:  Negative for dysuria, flank pain and frequency.  Musculoskeletal:  Positive for back pain. Negative for neck pain and neck stiffness.  Skin:  Negative for rash and wound.  Neurological:  Positive for light-headedness. Negative for syncope (not this time but in the past has had syncopal episodes) and headaches.  Psychiatric/Behavioral:  Negative for agitation and confusion.   All other systems reviewed and are negative.  Physical Exam Updated Vital Signs BP (!) 69/49   Pulse (!) 52   Temp 97.7 F (36.5 C)   Resp (!) 26   SpO2 100%  Physical Exam Vitals and nursing note reviewed.  Constitutional:      General: He is in acute distress.     Appearance: He is well-developed. He is ill-appearing and diaphoretic.  HENT:     Head: Normocephalic and atraumatic.     Mouth/Throat:     Mouth: Mucous membranes are dry.     Pharynx: No oropharyngeal exudate or posterior oropharyngeal erythema.  Eyes:  Extraocular Movements: Extraocular movements intact.     Conjunctiva/sclera: Conjunctivae normal.     Pupils: Pupils are equal, round, and reactive to light.  Cardiovascular:     Rate and Rhythm: Regular rhythm. Bradycardia present.     Pulses: Normal pulses.     Heart sounds: No murmur heard. Pulmonary:     Effort: Pulmonary effort is normal. No respiratory distress.     Breath sounds: Normal breath sounds. No wheezing, rhonchi or rales.  Chest:     Chest wall:  No tenderness.  Abdominal:     General: Abdomen is flat.     Palpations: Abdomen is soft.     Tenderness: There is abdominal tenderness. There is no right CVA tenderness, left CVA tenderness, guarding or rebound.  Musculoskeletal:        General: No swelling or tenderness.     Cervical back: Neck supple. No tenderness.  Skin:    General: Skin is warm.     Capillary Refill: Capillary refill takes less than 2 seconds.     Findings: No erythema or rash.  Neurological:     General: No focal deficit present.     Mental Status: He is alert.     Sensory: No sensory deficit.     Motor: No weakness.  Psychiatric:        Mood and Affect: Mood normal.    ED Results / Procedures / Treatments   Labs (all labs ordered are listed, but only abnormal results are displayed) Labs Reviewed  COMPREHENSIVE METABOLIC PANEL - Abnormal; Notable for the following components:      Result Value   CO2 20 (*)    Glucose, Bld 125 (*)    Anion gap 19 (*)    All other components within normal limits  URINALYSIS, ROUTINE W REFLEX MICROSCOPIC - Abnormal; Notable for the following components:   Specific Gravity, Urine 1.046 (*)    Ketones, ur 15 (*)    All other components within normal limits  LACTIC ACID, PLASMA - Abnormal; Notable for the following components:   Lactic Acid, Venous 4.0 (*)    All other components within normal limits  URINE CULTURE  CULTURE, BLOOD (ROUTINE X 2)  CULTURE, BLOOD (ROUTINE X 2)  LIPASE, BLOOD  CBC  LACTIC ACID, PLASMA  TROPONIN I (HIGH SENSITIVITY)  TROPONIN I (HIGH SENSITIVITY)    EKG EKG Interpretation  Date/Time:  Sunday May 20 2022 10:49:50 EDT Ventricular Rate:  57 PR Interval:  177 QRS Duration: 105 QT Interval:  466 QTC Calculation: 454 R Axis:   83 Text Interpretation: Sinus rhythm Atrial premature complex Consider right ventricular hypertrophy Borderline ST elevation, anterolateral leads when compared to prior, similar appearance with solwer rate.  normal QTC at this time. No STEMI Confirmed by Antony Blackbird [phone]) on 05/20/2022 11:15:44 AM  Radiology CT Angio Chest/Abd/Pel for Dissection W and/or Wo Contrast  Result Date: 05/20/2022 CLINICAL DATA:  Chest pain, back pain, epigastric pain, hypotension, diaphoresis EXAM: CT ANGIOGRAPHY CHEST, ABDOMEN AND PELVIS TECHNIQUE: Non-contrast CT of the chest was initially obtained. Multidetector CT imaging through the chest, abdomen and pelvis was performed using the standard protocol during bolus administration of intravenous contrast. Multiplanar reconstructed images and MIPs were obtained and reviewed to evaluate the vascular anatomy. RADIATION DOSE REDUCTION: This exam was performed according to the departmental dose-optimization program which includes automated exposure control, adjustment of the mA and/or kV according to patient size and/or use of iterative reconstruction technique. CONTRAST:  161m OMNIPAQUE IOHEXOL 350  MG/ML SOLN COMPARISON:  None Available. FINDINGS: CTA CHEST FINDINGS Cardiovascular: There is no evidence of mural hematoma in thoracic aorta in the noncontrast images of chest. There is no demonstrable intimal flap in the thoracic aorta. There are scattered coronary artery calcifications. Major branches of thoracic aorta appear patent. There are no intraluminal filling defects in the pulmonary artery branches. Mediastinum/Nodes: No significant lymphadenopathy seen. Thyroid is smaller than usual in size. Lungs/Pleura: There is no focal pulmonary consolidation. Small linear densities in the lower lung fields may suggest minimal scarring or minimal subsegmental atelectasis. There is no pleural effusion or pneumothorax. Musculoskeletal: Unremarkable. Review of the MIP images confirms the above findings. CTA ABDOMEN AND PELVIS FINDINGS VASCULAR Aorta: Small scattered calcifications are seen. There is no evidence of dissection or focal aneurysmal dilation. Celiac: Unremarkable. SMA:  Unremarkable. Renals: Unremarkable. IMA: Patent. Iliac arteries: There are scattered calcifications without intimal flap or focal aneurysmal dilation. Veins: Unremarkable. Review of the MIP images confirms the above findings. NON-VASCULAR Hepatobiliary: No focal abnormality is seen in the liver. There is no dilation of bile ducts. Gallbladder is not distended. There is no demonstrable fluid around the gallbladder. Pancreas: No focal abnormality is seen. Spleen: Unremarkable. Adrenals/Urinary Tract: Adrenals are unremarkable. There is no hydronephrosis. There is 5.3 cm cyst in the upper pole of left kidney. There is another 9 mm cyst in the medial upper pole of left kidney. There are no renal or ureteral stones. Urinary bladder is unremarkable. Stomach/Bowel: Small hiatal hernia is seen. There is mild dilation of small-bowel loops measuring up to 3.2 cm in diameter. There is fluid in the lumen of small bowel loops. Appendix is not distinctly seen. There is no focal pericecal inflammation. There is no significant wall thickening in the colon. There is no pericolic stranding. Lymphatic: Unremarkable. Reproductive: Prostate is enlarged. Other: There is no ascites or pneumoperitoneum. Left inguinal hernia containing fat is seen. Surgical clips are noted in the right suprapubic region. Musculoskeletal: Degenerative changes are noted in the lumbar spine at L5-S1 level. No acute findings are seen in the bony structures. Review of the MIP images confirms the above findings. IMPRESSION: There is no evidence of dissection in the thoracic and abdominal aorta. Major branches of thoracic and abdominal aorta appear patent. There is no evidence of pulmonary artery embolism. There are scattered coronary artery calcifications. There is no focal pulmonary consolidation. There is no pleural effusion or pneumothorax. There is no evidence intestinal obstruction or pneumoperitoneum. There is no hydronephrosis. There is mild dilation of  small-bowel loops with fluid in the lumen which may suggest ileus or nonspecific enteritis. Small hiatal hernia.  Left renal cysts. Other findings as described in the body of the report. Electronically Signed   By: Elmer Picker M.D.   On: 05/20/2022 12:39    Procedures Procedures    CRITICAL CARE Performed by: Gwenyth Allegra Gurveer Colucci Total critical care time: 35 minutes Critical care time was exclusive of separately billable procedures and treating other patients. Critical care was necessary to treat or prevent imminent or life-threatening deterioration. Critical care was time spent personally by me on the following activities: development of treatment plan with patient and/or surrogate as well as nursing, discussions with consultants, evaluation of patient's response to treatment, examination of patient, obtaining history from patient or surrogate, ordering and performing treatments and interventions, ordering and review of laboratory studies, ordering and review of radiographic studies, pulse oximetry and re-evaluation of patient's condition.   Medications Ordered in ED Medications  sodium  chloride 0.9 % bolus 1,000 mL (0 mLs Intravenous Stopped 05/20/22 1232)  ondansetron (ZOFRAN) injection 4 mg (4 mg Intravenous Given 05/20/22 1119)  fentaNYL (SUBLIMAZE) injection 50 mcg (50 mcg Intravenous Given 05/20/22 1119)  iohexol (OMNIPAQUE) 350 MG/ML injection 100 mL (100 mLs Intravenous Contrast Given 05/20/22 1155)    ED Course/ Medical Decision Making/ A&P                           Medical Decision Making Amount and/or Complexity of Data Reviewed Labs: ordered. Radiology: ordered.  Risk Prescription drug management.    BAWI LAKINS is a 76 y.o. male with a past medical history significant for irritable bowel syndrome, hyperlipidemia, previous inguinal hernia status postrepair, history of previous syncope, and previous appendectomy who presents with severe pain in his epigastrium  radiating to his back.  According to patient and spouse, patient had onset of pain this morning about 630 with pain in his epigastric area and right upper quadrant that is very sharp and does go to his low back.  He has not had pain like this before.  His usual irritable bowel syndrome pain is usually lower however he does report he had syncopal episodes in the past.  He denies any chest pain or palpitations or short of breath but is diaphoretic and felt very lightheaded.  Upon arrival, blood pressure was in the 51W and 25E systolic.  He was extremely ill-appearing.  He denies any pain going down his legs and denies any trauma.  Denies any constipation but does report one loose stool this morning.  No blood reported in the stool.  No urinary changes or reported history of kidney stones.  Denies any history of aortic abnormalities.  On my exam, he does have pulses in lower extremities.  Intact sensation and strength in legs.  Abdomen was very tender in the epigastric area right upper and left upper quadrants.  He did not have tenderness in the back but reported the sharp pain did go to his back.  Did not have chest pain but his tenderness did extend towards the xiphoid and lower chest.  No murmur.  Lungs were clear.  Good pulses in upper extremities.  Although he is lightheaded and diaphoretic he is answering questions appropriately.  No other focal neurologic deficits appreciated.  EKG showed sinus bradycardia that seems similar to prior.  He did not have prolonged QT despite a documented history of this.  Patient's blood pressure responded to some fluids and went over 100.  We agreed that if his pressure improved he could get pain medicine and possible nausea medicine.  He ended having a large episode of emesis and will get nausea medicine.  There was not reportedly bloody.  Clinically I do feel need to work-up for life-threatening etiologies.  Differential diagnosis includes acute cholecystitis given his  reported history of previous gallstones, aortic dissection or aneurysm, diverticulitis, bowel obstruction, or other etiologies.  He will get labs, dissection study, and will rule out concerning findings.  Given his severe pain, nausea, vomiting, and blood pressure in the 52D systolic on arrival, I do not anticipate he will be discharged home today.  3:26 PM Throughout his stay, patient's symptoms have resolved and he is feeling much better.  He is now symptom-free and would like to eat and go home.  Patient's work-up returned and discussed findings with family and patient.  Initially, lactic acid was elevated at 4.0 but after fluids  were dropped to 1.8 and in the normal range.  Troponin negative x2.  Urinalysis showed some ketones suggesting dehydration but otherwise no evidence of infection.  Hepatic function lipase not elevated given the abdominal discomfort.  Normal kidney function.  CBC reassuring without leukocytosis.  Troponin was negative.  CTA of the chest/abdomen/pelvis did not show evidence of acute dissection or other abnormality of the aorta.  It also did not show evidence of any acute gallbladder abnormality.  It did show possible ileus versus enteritis that was very mild but otherwise no findings. It did show a small hiatal hernia.   Had a long shared decision-making conversation with patient and family.  Patient thinks that he had a vagal episode related to his abdominal pain that was transient and now resolved.  He reports he had 30 years of syncopal episodes related to pain and I suspect that we saw the bradycardia and hypotensive episode right before he typically passes out.  We also discussed that theoretically his hiatal hernia could have further herniated causing the transient symptoms but he does not want further work-up at this time.  We offered ultrasound to further evaluate the gallbladder versus even admission given the near syncope, hypotension, and ill appearance of the  patient on arrival but he wants to go home.  We offered admission.  Family knows to have patient follow-up with his PCP and gastroenterologist and rest and stay hydrated.  He still wants to go home.  Patient and family discharged after over 5 hours of monitoring and with resolution of symptoms.          Final Clinical Impression(s) / ED Diagnoses Final diagnoses:  Near syncope  Transient hypotension  Nausea and vomiting, unspecified vomiting type  Epigastric pain  Hiatal hernia    Rx / DC Orders ED Discharge Orders     None      Clinical Impression: 1. Near syncope   2. Transient hypotension   3. Nausea and vomiting, unspecified vomiting type   4. Epigastric pain   5. Hiatal hernia     Disposition: Discharge  Condition: Good  I have discussed the results, Dx and Tx plan with the pt(& family if present). He/she/they expressed understanding and agree(s) with the plan. Discharge instructions discussed at great length. Strict return precautions discussed and pt &/or family have verbalized understanding of the instructions. No further questions at time of discharge.    New Prescriptions   No medications on file    Follow Up: Surgery, [facility] [address] [place] [address] [phone]     Vivi Barrack, [address] [place] [address] [phone]         Foye Damron, Gwenyth Allegra, MD 05/20/22 [phone]

## 2022-05-20 NOTE — ED Notes (Signed)
Pt is able to tolerate water and left water at bedside.

## 2022-05-20 NOTE — ED Notes (Incomplete)
Unable to

## 2022-05-21 ENCOUNTER — Encounter: Payer: Self-pay | Admitting: Family Medicine

## 2022-05-21 LAB — URINE CULTURE: Culture: NO GROWTH

## 2022-05-22 ENCOUNTER — Ambulatory Visit (INDEPENDENT_AMBULATORY_CARE_PROVIDER_SITE_OTHER): Payer: Medicare Other | Admitting: Family Medicine

## 2022-05-22 ENCOUNTER — Encounter: Payer: Self-pay | Admitting: Family Medicine

## 2022-05-22 VITALS — BP 109/75 | HR 57 | Temp 98.3°F | Ht 70.0 in | Wt 164.0 lb

## 2022-05-22 DIAGNOSIS — R1013 Epigastric pain: Secondary | ICD-10-CM

## 2022-05-22 NOTE — Progress Notes (Signed)
   Kristopher Martinez is a 76 y.o. male who presents today for an office visit.  Assessment/Plan:  Epigastric abdominal pain/nausea/vomiting Symptoms are much better today. Concern for bilary colic based on his history and work-up in the ED however it is possible that he may have had a viral enteritis.  It is reassuring that his symptoms are improving and his exam today is reassuring.  We discussed with patient that CT scan was not ideal for ruling out gallstones and that he will need an ultrasound to better evaluate and rule out gallstones.  He is agreeable to this.  We will place order for right upper quadrant ultrasound.  If notable for gallstones will need referral to general surgery.  If negative will need to follow-up with GI.  We discussed return precautions and reasons to return to care.  He will go back to the ED if he has any recurrence of epigastric abdominal pain or any other severe symptoms.     Subjective:  HPI:  Patient here for ED follow-up.  Went to ED on 05/20/2022 after suffering a syncopal episode after having a bout of epigastric pain, nausea, and vomiting at home.  In the ED he another had a syncopal episode while in the waiting room. He was initially found to have elevated lactic acid however this improved with fluids.  Had a CTA of the chest abdomen and pelvis which did not show any acute abnormality but did show possible ileus versus enteritis.  Remainder of his labs were reassuring including negative troponin x 2 and normal CBC and CMET. His pain improved in the ED and he was discharged home. He has had a little bit of residual nausea with a few episodes of biliary emesis since then.  No fevers or chills.       Objective:  Physical Exam: BP 109/75   Pulse (!) 57   Temp 98.3 F (36.8 C) (Temporal)   Ht '5\' 10"'$  (1.778 m)   Wt 164 lb (74.4 kg)   SpO2 100%   BMI 23.53 kg/m   Gen: No acute distress, resting comfortably CV: Regular rate and rhythm with no murmurs  appreciated Pulm: Normal work of breathing, clear to auscultation bilaterally with no crackles, wheezes, or rhonchi GI: Soft, nontender, nondistended.  Murphy sign negative.  No rebound or guarding. Neuro: Grossly normal, moves all extremities Psych: Normal affect and thought content  Time Spent: 40 minutes of total time was spent on the date of the encounter performing the following actions: chart review prior to seeing the patient including recent ED visit, obtaining history, performing a medically necessary exam, counseling on the treatment plan, placing orders, and documenting in our EHR.        [name]. Jerline Pain, MD 05/22/2022 3:33 PM

## 2022-05-22 NOTE — Patient Instructions (Signed)
It was very nice to see you today!  We will get an ultrasound to further look for gallstones.  We may need to send you to a surgeon depending on the results.  Please go to the emergency room if you have any recurrence of symptoms.  Take care, Dr Jerline Pain  PLEASE NOTE:  If you had any lab tests please let us know if you have not heard back within a few days. You may see your results on mychart before we have a chance to review them but we will give you a call once they are reviewed by [place]. If we ordered any referrals today, please let us know if you have not heard from their office within the next week.   Please try these tips to maintain a healthy lifestyle:  Eat at least 3 REAL meals and 1-2 snacks per day.  Aim for no more than 5 hours between eating.  If you eat breakfast, please do so within one hour of getting up.   Each meal should contain half fruits/vegetables, one quarter protein, and one quarter carbs (no bigger than a computer mouse)  Cut down on sweet beverages. This includes juice, soda, and sweet tea.   Drink at least 1 glass of water with each meal and aim for at least 8 glasses per day  Exercise at least 150 minutes every week.

## 2022-05-25 ENCOUNTER — Other Ambulatory Visit: Payer: Self-pay

## 2022-05-25 ENCOUNTER — Ambulatory Visit
Admission: RE | Admit: 2022-05-25 | Discharge: 2022-05-25 | Disposition: A | Discharge status: Home / Self Care | Payer: Medicare Other | Source: Ambulatory Visit | Attending: Family Medicine | Admitting: Family Medicine

## 2022-05-25 DIAGNOSIS — K824 Cholesterolosis of gallbladder: Secondary | ICD-10-CM

## 2022-05-25 DIAGNOSIS — K8051 Calculus of bile duct without cholangitis or cholecystitis with obstruction: Secondary | ICD-10-CM | POA: Diagnosis not present

## 2022-05-25 DIAGNOSIS — R1013 Epigastric pain: Secondary | ICD-10-CM

## 2022-05-25 LAB — CULTURE, BLOOD (ROUTINE X 2)
Culture: NO GROWTH
Special Requests: ADEQUATE

## 2022-05-25 NOTE — Progress Notes (Signed)
Please inform patient of the following:  He does not have any gallstones though does have a polyp.  It is possible this could be causing his symptoms.  Recommend referral to surgery to discuss next steps.

## 2022-05-31 ENCOUNTER — Encounter: Payer: Self-pay | Admitting: Family Medicine

## 2022-06-18 DIAGNOSIS — H903 Sensorineural hearing loss, bilateral: Secondary | ICD-10-CM | POA: Diagnosis not present

## 2022-06-19 DIAGNOSIS — K824 Cholesterolosis of gallbladder: Secondary | ICD-10-CM | POA: Diagnosis not present

## 2022-08-15 ENCOUNTER — Other Ambulatory Visit: Payer: Self-pay | Admitting: Family Medicine

## 2022-08-15 MED ORDER — TRIAMCINOLONE ACETONIDE 0.1 % EX CREA: TOPICAL_CREAM | Freq: Two times a day (BID) | CUTANEOUS | 0 refills | Status: DC

## 2022-08-23 ENCOUNTER — Other Ambulatory Visit: Payer: Self-pay | Admitting: Family Medicine

## 2022-08-23 NOTE — Telephone Encounter (Signed)
Alternative Requested:PER INSURANCE NOT COVERED.

## 2022-08-28 ENCOUNTER — Encounter: Payer: Self-pay | Admitting: Family Medicine

## 2022-11-26 ENCOUNTER — Encounter: Payer: Self-pay | Admitting: Family Medicine

## 2022-12-03 ENCOUNTER — Ambulatory Visit (INDEPENDENT_AMBULATORY_CARE_PROVIDER_SITE_OTHER): Payer: Medicare Other | Admitting: Family Medicine

## 2022-12-03 ENCOUNTER — Encounter: Payer: Self-pay | Admitting: Family Medicine

## 2022-12-03 ENCOUNTER — Other Ambulatory Visit: Payer: Self-pay | Admitting: Family Medicine

## 2022-12-03 VITALS — BP 125/78 | HR 61 | Temp 98.0°F | Ht 70.0 in | Wt 165.2 lb

## 2022-12-03 DIAGNOSIS — R5383 Other fatigue: Secondary | ICD-10-CM

## 2022-12-03 DIAGNOSIS — N529 Male erectile dysfunction, unspecified: Secondary | ICD-10-CM

## 2022-12-03 DIAGNOSIS — L219 Seborrheic dermatitis, unspecified: Secondary | ICD-10-CM | POA: Diagnosis not present

## 2022-12-03 DIAGNOSIS — E785 Hyperlipidemia, unspecified: Secondary | ICD-10-CM

## 2022-12-03 LAB — TESTOSTERONE: Testosterone: 539.13 ng/dL (ref 300.00–890.00)

## 2022-12-03 LAB — COMPREHENSIVE METABOLIC PANEL
ALT: 44 U/L (ref 0–53)
AST: 30 U/L (ref 0–37)
Albumin: 4.7 g/dL (ref 3.5–5.2)
Alkaline Phosphatase: 65 U/L (ref 39–117)
BUN: 19 mg/dL (ref 6–23)
CO2: 27 mEq/L (ref 19–32)
Calcium: 9.1 mg/dL (ref 8.4–10.5)
Chloride: 104 mEq/L (ref 96–112)
Creatinine, Ser: 0.88 mg/dL (ref 0.40–1.50)
GFR: 83.64 mL/min (ref >60.00)
Glucose, Bld: 79 mg/dL (ref 70–99)
Potassium: 4.1 mEq/L (ref 3.5–5.1)
Sodium: 140 mEq/L (ref 135–145)
Total Bilirubin: 0.9 mg/dL (ref 0.2–1.2)
Total Protein: 7 g/dL (ref 6.0–8.3)

## 2022-12-03 LAB — CBC
HCT: 46.8 % (ref 39.0–52.0)
Hemoglobin: 16.3 g/dL (ref 13.0–17.0)
MCHC: 34.9 g/dL (ref 30.0–36.0)
MCV: 94.9 fl (ref 78.0–100.0)
Platelets: 210 10*3/uL (ref 150.0–400.0)
RBC: 4.93 Mil/uL (ref 4.22–5.81)
RDW: 13.3 % (ref 11.5–15.5)
WBC: 7.4 10*3/uL (ref 4.0–10.5)

## 2022-12-03 LAB — VITAMIN D 25 HYDROXY (VIT D DEFICIENCY, FRACTURES): VITD: 37.56 ng/mL (ref 30.00–100.00)

## 2022-12-03 LAB — TSH: TSH: 2.73 u[IU]/mL (ref 0.35–5.50)

## 2022-12-03 LAB — VITAMIN B12: Vitamin B-12: 303 pg/mL (ref 211–911)

## 2022-12-03 MED ORDER — DESONIDE 0.05 % EX CREA
TOPICAL_CREAM | CUTANEOUS | 0 refills | Status: DC
Start: 2022-12-03 — End: 2024-02-18

## 2022-12-03 NOTE — Progress Notes (Signed)
   Kristopher Martinez is a 76 y.o. male who presents today for an office visit.  Assessment/Plan:  New/Acute Problems: Other Fatigue Likely multifactorial.  He is working with a therapist which seems to be helping.  He is having some insomnia which is contributing as well.  We will check labs today including testosterone, CBC, c-Met, TSH, vitamin D, and B12.  Chronic Problems Addressed Today: Erectile dysfunction On Cialis as needed.  Check testosterone.  Seborrheic dermatitis Stable on desonide as needed.  Will refill today.     Subjective:  HPI:  See A/p for status of chronic conditions.    HE is concerned about low testosterone. He is having issues with decreased libido, erectile dysfunction, and low energy. He has had some issues with getting restful sleep. He has been seeing a therapist which has been going well.        Objective:  Physical Exam: BP 125/78   Pulse 61   Temp 98 F (36.7 C) (Temporal)   Ht _0  (1.778 Martinez)   Wt 165 lb 3.2 oz (74.9 kg)   SpO2 96%   BMI 23.70 kg/Martinez   Gen: No acute distress, resting comfortably CV: Regular rate and rhythm with no murmurs appreciated Pulm: Normal work of breathing, clear to auscultation bilaterally with no crackles, wheezes, or rhonchi Neuro: Grossly normal, moves all extremities Psych: Normal affect and thought content      Kristopher Martinez. Jerline Pain, MD 12/03/2022 1:24 PM

## 2022-12-03 NOTE — Assessment & Plan Note (Signed)
Stable on desonide as needed.  Will refill today.

## 2022-12-03 NOTE — Patient Instructions (Addendum)
It was very nice to see you today!  We will check blood work today.  Please keep up the good work with your diet and exercise.  Take care, Dr Jerline Pain  PLEASE NOTE:  If you had any lab tests please let us know if you have not heard back within a few days. You may see your results on mychart before we have a chance to review them but we will give you a call once they are reviewed by [place]. If we ordered any referrals today, please let us know if you have not heard from their office within the next week.   Please try these tips to maintain a healthy lifestyle:  Eat at least 3 REAL meals and 1-2 snacks per day.  Aim for no more than 5 hours between eating.  If you eat breakfast, please do so within one hour of getting up.   Each meal should contain half fruits/vegetables, one quarter protein, and one quarter carbs (no bigger than a computer mouse)  Cut down on sweet beverages. This includes juice, soda, and sweet tea.   Drink at least 1 glass of water with each meal and aim for at least 8 glasses per day  Exercise at least 150 minutes every week.

## 2022-12-03 NOTE — Assessment & Plan Note (Signed)
On Cialis as needed.  Check testosterone.

## 2022-12-04 ENCOUNTER — Encounter: Payer: Self-pay | Admitting: Family Medicine

## 2022-12-04 ENCOUNTER — Other Ambulatory Visit: Payer: Self-pay | Admitting: Family Medicine

## 2022-12-04 MED ORDER — TRIAMCINOLONE ACETONIDE 0.1 % EX CREA: TOPICAL_CREAM | Freq: Two times a day (BID) | CUTANEOUS | 0 refills | Status: DC

## 2022-12-04 NOTE — Telephone Encounter (Signed)
Please advise on alternative.  

## 2022-12-04 NOTE — Progress Notes (Signed)
Please inform patient of the following:  Labs are all NORMAL.  Do not need to do any further testing at this point.

## 2022-12-07 ENCOUNTER — Encounter: Payer: Self-pay | Admitting: Family Medicine

## 2022-12-07 NOTE — Telephone Encounter (Signed)
All of his vitamins are reasonable.  Vitamin D is usually synthesized in the skin - there is typically not any issue with absorption.  Due to general recommendations to avoid UV exposure it is common for people to be low in vitamin D.  Kristopher Martinez. Jerline Pain, MD 12/07/2022 3:12 PM

## 2022-12-07 NOTE — Telephone Encounter (Signed)
Please advise 

## 2023-01-07 ENCOUNTER — Ambulatory Visit (INDEPENDENT_AMBULATORY_CARE_PROVIDER_SITE_OTHER): Payer: Medicare Other

## 2023-01-07 VITALS — Wt 164.0 lb

## 2023-01-07 DIAGNOSIS — Z0000 Encounter for general adult medical examination without abnormal findings: Secondary | ICD-10-CM

## 2023-01-07 NOTE — Patient Instructions (Signed)
Mr. Kristopher Martinez , Thank you for taking time to come for your Medicare Wellness Visit. I appreciate your ongoing commitment to your health goals. Please review the following plan we discussed and let me know if I can assist you in the future.   These are the goals we discussed:  Goals      Patient Stated     None at this time      Patient Stated     None at this time         This is a list of the screening recommended for you and due dates:  Health Maintenance  Topic Date Due   COVID-19 Vaccine (6 - 2023-24 season) 09/26/2022   Medicare Annual Wellness Visit  01/08/2024   Colon Cancer Screening  10/11/2024   DTaP/Tdap/Td vaccine (4 - Td or Tdap) 08/16/2029   Pneumonia Vaccine  Completed   Flu Shot  Completed   Hepatitis C Screening: USPSTF Recommendation to screen - Ages 18-79 yo.  Completed   HPV Vaccine  Aged Out   Zoster (Shingles) Vaccine  Discontinued    Advanced directives: Please bring a copy of your health care power of attorney and living will to the office at your convenience.  Conditions/risks identified: keep exercising   Next appointment: Follow up in one year for your annual wellness visit.   Preventive Care 56 Years and Older, Male  Preventive care refers to lifestyle choices and visits with your health care provider that can promote health and wellness. What does preventive care include? A yearly physical exam. This is also called an annual well check. Dental exams once or twice a year. Routine eye exams. Ask your health care provider how often you should have your eyes checked. Personal lifestyle choices, including: Daily care of your teeth and gums. Regular physical activity. Eating a healthy diet. Avoiding tobacco and drug use. Limiting alcohol use. Practicing safe sex. Taking low doses of aspirin every day. Taking vitamin and mineral supplements as recommended by your health care provider. What happens during an annual well check? The services and  screenings done by your health care provider during your annual well check will depend on your age, overall health, lifestyle risk factors, and family history of disease. Counseling  Your health care provider may ask you questions about your: Alcohol use. Tobacco use. Drug use. Emotional well-being. Home and relationship well-being. Sexual activity. Eating habits. History of falls. Memory and ability to understand (cognition). Work and work Statistician. Screening  You may have the following tests or measurements: Height, weight, and BMI. Blood pressure. Lipid and cholesterol levels. These may be checked every 5 years, or more frequently if you are over 15 years old. Skin check. Lung cancer screening. You may have this screening every year starting at age 50 if you have a 30-pack-year history of smoking and currently smoke or have quit within the past 15 years. Fecal occult blood test (FOBT) of the stool. You may have this test every year starting at age 42. Flexible sigmoidoscopy or colonoscopy. You may have a sigmoidoscopy every 5 years or a colonoscopy every 10 years starting at age 68. Prostate cancer screening. Recommendations will vary depending on your family history and other risks. Hepatitis C blood test. Hepatitis B blood test. Sexually transmitted disease (STD) testing. Diabetes screening. This is done by checking your blood sugar (glucose) after you have not eaten for a while (fasting). You may have this done every 1-3 years. Abdominal aortic aneurysm (AAA) screening. You  may need this if you are a current or former smoker. Osteoporosis. You may be screened starting at age 64 if you are at high risk. Talk with your health care provider about your test results, treatment options, and if necessary, the need for more tests. Vaccines  Your health care provider may recommend certain vaccines, such as: Influenza vaccine. This is recommended every year. Tetanus, diphtheria, and  acellular pertussis (Tdap, Td) vaccine. You may need a Td booster every 10 years. Zoster vaccine. You may need this after age 51. Pneumococcal 13-valent conjugate (PCV13) vaccine. One dose is recommended after age 63. Pneumococcal polysaccharide (PPSV23) vaccine. One dose is recommended after age 27. Talk to your health care provider about which screenings and vaccines you need and how often you need them. This information is not intended to replace advice given to you by your health care provider. Make sure you discuss any questions you have with your health care provider. Document Released: 01/13/2016 Document Revised: 09/05/2016 Document Reviewed: 10/18/2015 Elsevier Interactive Patient Education  2017 [employer] Prevention in the Home Falls can cause injuries. They can happen to people of all ages. There are many things you can do to make your home safe and to help prevent falls. What can I do on the outside of my home? Regularly fix the edges of walkways and driveways and fix any cracks. Remove anything that might make you trip as you walk through a door, such as a raised step or threshold. Trim any bushes or trees on the path to your home. Use bright outdoor lighting. Clear any walking paths of anything that might make someone trip, such as rocks or tools. Regularly check to see if handrails are loose or broken. Make sure that both sides of any steps have handrails. Any raised decks and porches should have guardrails on the edges. Have any leaves, snow, or ice cleared regularly. Use sand or salt on walking paths during winter. Clean up any spills in your garage right away. This includes oil or grease spills. What can I do in the bathroom? Use night lights. Install grab bars by the toilet and in the tub and shower. Do not use towel bars as grab bars. Use non-skid mats or decals in the tub or shower. If you need to sit down in the shower, use a plastic, non-slip stool. Keep  the floor dry. Clean up any water that spills on the floor as soon as it happens. Remove soap buildup in the tub or shower regularly. Attach bath mats securely with double-sided non-slip rug tape. Do not have throw rugs and other things on the floor that can make you trip. What can I do in the bedroom? Use night lights. Make sure that you have a light by your bed that is easy to reach. Do not use any sheets or blankets that are too big for your bed. They should not hang down onto the floor. Have a firm chair that has side arms. You can use this for support while you get dressed. Do not have throw rugs and other things on the floor that can make you trip. What can I do in the kitchen? Clean up any spills right away. Avoid walking on wet floors. Keep items that you use a lot in easy-to-reach places. If you need to reach something above you, use a strong step stool that has a grab bar. Keep electrical cords out of the way. Do not use floor polish or wax that  makes floors slippery. If you must use wax, use non-skid floor wax. Do not have throw rugs and other things on the floor that can make you trip. What can I do with my stairs? Do not leave any items on the stairs. Make sure that there are handrails on both sides of the stairs and use them. Fix handrails that are broken or loose. Make sure that handrails are as long as the stairways. Check any carpeting to make sure that it is firmly attached to the stairs. Fix any carpet that is loose or worn. Avoid having throw rugs at the top or bottom of the stairs. If you do have throw rugs, attach them to the floor with carpet tape. Make sure that you have a light switch at the top of the stairs and the bottom of the stairs. If you do not have them, ask someone to add them for you. What else can I do to help prevent falls? Wear shoes that: Do not have high heels. Have rubber bottoms. Are comfortable and fit you well. Are closed at the toe. Do not  wear sandals. If you use a stepladder: Make sure that it is fully opened. Do not climb a closed stepladder. Make sure that both sides of the stepladder are locked into place. Ask someone to hold it for you, if possible. Clearly mark and make sure that you can see: Any grab bars or handrails. First and last steps. Where the edge of each step is. Use tools that help you move around (mobility aids) if they are needed. These include: Canes. Walkers. Scooters. Crutches. Turn on the lights when you go into a dark area. Replace any light bulbs as soon as they burn out. Set up your furniture so you have a clear path. Avoid moving your furniture around. If any of your floors are uneven, fix them. If there are any pets around you, be aware of where they are. Review your medicines with your doctor. Some medicines can make you feel dizzy. This can increase your chance of falling. Ask your doctor what other things that you can do to help prevent falls. This information is not intended to replace advice given to you by your health care provider. Make sure you discuss any questions you have with your health care provider. Document Released: 10/13/2009 Document Revised: 05/24/2016 Document Reviewed: 01/21/2015 Elsevier Interactive Patient Education  2017 [employer].

## 2023-01-07 NOTE — Progress Notes (Signed)
I connected with  Kristopher Martinez on 01/07/23 by a audio enabled telemedicine application and verified that I am speaking with the correct person using two identifiers.  Patient Location: Home  Provider Location: Office/Clinic  I discussed the limitations of evaluation and management by telemedicine. The patient expressed understanding and agreed to proceed.   Subjective:   Kristopher Martinez is a 77 y.o. male who presents for Medicare Annual/Subsequent preventive examination.  Review of Systems     Cardiac Risk Factors include: advanced age (>23mn, >>39women);dyslipidemia;male gender     Objective:    Today's Vitals   01/07/23 1148  Weight: 164 lb (74.4 kg)   Body mass index is 23.53 kg/m.     01/07/2023   11:52 AM 12/18/2021   11:54 AM 10/20/2021    7:31 PM 10/11/2021   10:39 AM 08/17/2019    5:28 AM 02/05/2018    7:12 AM 04/08/2014   10:31 AM  Advanced Directives  Does Patient Have a Medical Advance Directive? Yes Yes No Yes No Yes Patient has advance directive, copy not in chart  Type of Advance Directive [place] will Healthcare Power of [place] will  [place] will [place]Living will  Does patient want to make changes to medical advance directive?   No - Patient declined No - Patient declined     Copy of [place] Chart? No - copy requested No - copy requested  No - copy requested  Yes Copy requested from family  Would patient like information on creating a medical advance directive?   No - Patient declined        Current Medications (verified) Outpatient Encounter Medications as of 01/07/2023  Medication Sig   acetic acid-hydrocortisone (VOSOL-HC) OTIC solution Place 4 drops into both ears 4 (four) times daily.   atorvastatin (LIPITOR) 20 MG tablet Take 1 tablet (20 mg total) by mouth daily.   desonide (DESOWEN) 0.05 % cream APPLY TO  AFFECTED AREA TWICE A DAY   ibuprofen (ADVIL,MOTRIN) 200 MG tablet Take 200 mg by mouth every 6 (six) hours as needed for mild pain.   OVER THE COUNTER MEDICATION Take 1 tablet by mouth at bedtime. MELATONIN   tadalafil (CIALIS) 10 MG tablet TAKE ONE TABLET BY MOUTH DAILY AS NEEDED FOR ERECTILE DYSFUNCTION   triamcinolone cream (KENALOG) 0.1 % Apply topically 2 (two) times daily.   polyethylene glycol (MIRALAX / GLYCOLAX) 17 g packet Take 17 g by mouth daily as needed for moderate constipation. (Patient not taking: Reported on 12/03/2022)   No facility-administered encounter medications on file as of 01/07/2023.    Allergies (verified) Augmentin [amoxicillin-pot clavulanate], Amoxicillin, Trazodone and nefazodone, and Cleocin [clindamycin hcl]   History: Past Medical History:  Diagnosis Date   Flu    03/23/14     Hemorrhoid    Hyperlipidemia    IBS (irritable bowel syndrome)    Low back pain    Sinusitis    03/23/14   Syncope 1995   did all testing-post-prandal-pt.states was told had long QT segment   Past Surgical History:  Procedure Laterality Date   APPENDECTOMY     COLONOSCOPY  2008   HYPERPLASTIC POLYPS X 8    COLONOSCOPY  02/05/2018   Dr.Stark   EYE SURGERY     strabismus as child   HYPERPLASIA TISSUE EXCISION     atypical lymphoid    INGUINAL HERNIA REPAIR Right 04/08/2014  Procedure: LAPAROSCOPIC RIGHT INGUINAL HERNIA REPAIR;  Surgeon: Gayland Curry, MD;  Location: [facility];  Service: General;  Laterality: Right;   INSERTION OF MESH Right 04/08/2014   Procedure: INSERTION OF MESH;  Surgeon: Gayland Curry, MD;  Location: [facility];  Service: General;  Laterality: Right;   POLYPECTOMY     TONSILLECTOMY     WISDOM TOOTH EXTRACTION     Family History  Problem Relation Age of Onset   Colitis Mother    Alcohol abuse Mother    Cancer Father        prostate cancer   Prostate cancer Father    Prostate cancer Other    Colon cancer Neg Hx    Colon polyps Neg Hx     Esophageal cancer Neg Hx    Rectal cancer Neg Hx    Stomach cancer Neg Hx    Social History   Socioeconomic History   Marital status: Married    Spouse name: Not on file   Number of children: 0   Years of education: Not on file   Highest education level: Not on file  Occupational History   Not on file  Tobacco Use   Smoking status: Former    Types: Cigarettes    Quit date: 12/31/1976    Years since quitting: 46.0   Smokeless tobacco: Never  Vaping Use   Vaping Use: Never used  Substance and Sexual Activity   Alcohol use: Yes    Alcohol/week: 2.0 standard drinks of alcohol    Types: 2 Glasses of wine per week   Drug use: No   Sexual activity: Yes  Other Topics Concern   Not on file  Social History Narrative   Not on file   Social Determinants of Health   Financial Resource Strain: Not on file  Food Insecurity: No Food Insecurity (01/07/2023)   Hunger Vital Sign    Worried About Running Out of Food in the Last Year: Never true    Ran Out of Food in the Last Year: Never true  Transportation Needs: No Transportation Needs (01/07/2023)   PRAPARE - Hydrologist (Medical): No    Lack of Transportation (Non-Medical): No  Physical Activity: Insufficiently Active (01/07/2023)   Exercise Vital Sign    Days of Exercise per Week: 3 days    Minutes of Exercise per Session: 30 min  Stress: No Stress Concern Present (01/07/2023)   [place]    Feeling of Stress : Only a little  Social Connections: Unknown (01/03/2023)   Social Connection and Isolation Panel [NHANES]    Frequency of Communication with Friends and Family: Once a week    Frequency of Social Gatherings with Friends and Family: Not on file    Attends Religious Services: Not on Advertising copywriter or Organizations: Not on file    Attends Archivist Meetings: Not on file    Marital Status: Not on file     Tobacco Counseling Counseling given: Not Answered   Clinical Intake:  Pre-visit preparation completed: Yes  Pain : No/denies pain     BMI - recorded: 23.53 Nutritional Status: BMI of 19-24  Normal Nutritional Risks: None Diabetes: No  How often do you need to have someone help you when you read instructions, pamphlets, or other written materials from your doctor or pharmacy?: 1 - Never  Diabetic?no  Interpreter Needed?: No  Information entered  by :: Charlott Rakes, LPN   Activities of Daily Living    01/03/2023    8:40 AM  In your present state of health, do you have any difficulty performing the following activities:  Hearing? 0  Vision? 0  Difficulty concentrating or making decisions? 0  Walking or climbing stairs? 0  Dressing or bathing? 0  Doing errands, shopping? 0  Preparing Food and eating ? N  Using the Toilet? N  In the past six months, have you accidently leaked urine? N  Do you have problems with loss of bowel control? N  Managing your Medications? N  Managing your Finances? N  Housekeeping or managing your Housekeeping? N    Patient Care Team: Vivi Barrack, MD as PCP - General (Family Medicine)  Indicate any recent Medical Services you may have received from other than [facility] providers in the past year (date may be approximate).     Assessment:   This is a routine wellness examination for Dencil.  Hearing/Vision screen Hearing Screening - Comments:: Pt has tinnitus  Vision Screening - Comments:: Pt follows up with Dr Macarthur Critchley &amp; Dr Ellie Lunch  for annual eye exams   Dietary issues and exercise activities discussed: Current Exercise Habits: Home exercise routine, Type of exercise: walking, Time (Minutes): 30, Frequency (Times/Week): 3, Weekly Exercise (Minutes/Week): 90   Goals Addressed             This Visit's Progress    Patient Stated       None at this time        Depression Screen    01/07/2023   11:52 AM 12/03/2022    12:58 PM 05/22/2022    2:58 PM 04/18/2022    9:00 AM 12/18/2021   11:53 AM 12/14/2021    9:03 AM 04/05/2021   10:05 AM  PHQ 2/9 Scores  PHQ - 2 Score 0 0 0 0 0 0 0  PHQ- 9 Score       2    Fall Risk    01/03/2023    8:40 AM 12/03/2022   12:58 PM 05/22/2022    2:58 PM 04/18/2022    9:00 AM 12/18/2021   11:55 AM  [place] in the past year? 0 0 0 0 0  Number falls in past yr: 0 0 0 0 0  Injury with Fall? 0 0 0 0 0  Risk for fall due to : Impaired vision No Fall Risks No Fall Risks No Fall Risks Impaired vision  Follow up Falls prevention discussed    Falls prevention discussed    FALL RISK PREVENTION PERTAINING TO THE HOME:  Any stairs in or around the home? No  If so, are there any without handrails? No  Home free of loose throw rugs in walkways, pet beds, electrical cords, etc? Yes  Adequate lighting in your home to reduce risk of falls? Yes   ASSISTIVE DEVICES UTILIZED TO PREVENT FALLS:  Life alert? No  Use of a cane, walker or w/c? No  Grab bars in the bathroom? No  Shower chair or bench in shower? No  Elevated toilet seat or a handicapped toilet? No   TIMED UP AND GO:  Was the test performed? No .  Cognitive Function:        01/07/2023   11:54 AM 12/18/2021   11:56 AM  6CIT Screen  What Year? 0 points 0 points  What month? 0 points 0 points  What time? 0 points 0  points  Count back from 20 0 points 0 points  Months in reverse 0 points 0 points  Repeat phrase 0 points 0 points  Total Score 0 points 0 points    Immunizations Immunization History  Administered Date(s) Administered   Fluad Quad(high Dose 65+) 08/27/2019, 10/18/2020, 08/28/2022   Influenza Split 12/27/2011, 12/04/2012   Influenza, High Dose Seasonal PF 10/08/2013, 11/16/2014, 12/14/2015, 09/20/2016, 09/25/2017, 10/15/2018   Influenza,inj,Quad PF,6+ Mos 09/11/2021   Moderna Covid-19 Vaccine Bivalent Booster 19yr & up 11/07/2021   Moderna Sars-Covid-2 Vaccination 01/30/2020, 02/29/2020,  10/28/2020   Pfizer Covid-19 Vaccine Bivalent Booster 19yr& up 08/01/2022   Pneumococcal Conjugate-13 07/15/2014   Pneumococcal Polysaccharide-23 06/05/2013, 11/16/2019   Td 01/01/2004   Tdap 08/17/2019   Tetanus 07/15/2014    TDAP status: Up to date  Flu Vaccine status: Up to date  Pneumococcal vaccine status: Up to date  Covid-19 vaccine status: Completed vaccines  Qualifies for Shingles Vaccine? No    Screening Tests Health Maintenance  Topic Date Due   COVID-19 Vaccine (6 - 2023-24 season) 09/26/2022   Medicare Annual Wellness (AWV)  01/08/2024   COLONOSCOPY (Pts [id number] coverage will need to be confirmed)  10/11/2024   DTaP/Tdap/Td (4 - Td or Tdap) 08/16/2029   Pneumonia Vaccine 65+65ears old  Completed   INFLUENZA VACCINE  Completed   Hepatitis C Screening  Completed   HPV VACCINES  Aged Out   Zoster Vaccines- Shingrix  Discontinued    Health Maintenance  Health Maintenance Due  Topic Date Due   COVID-19 Vaccine (6 - 2023-24 season) 09/26/2022    Colorectal cancer screening: Type of screening: Colonoscopy. Completed 10/11/21. Repeat every 3 years  Additional Screening:  Hepatitis C Screening:  Completed 07/15/14  Vision Screening: Recommended annual ophthalmology exams for early detection of glaucoma and other disorders of the eye. Is the patient up to date with their annual eye exam?  Yes  Who is the provider or what is the name of the office in which the patient attends annual eye exams? Dr JonMacarthur CritchleyDr McCEllie Lunchf pt is not established with a provider, would they like to be referred to a provider to establish care? No .   Dental Screening: Recommended annual dental exams for proper oral hygiene  Community Resource Referral / Chronic Care Management: CRR required this visit?  No   CCM required this visit?  No      Plan:     I have personally reviewed and noted the following in the patient's chart:   Medical and social history Use  of alcohol, tobacco or illicit drugs  Current medications and supplements including opioid prescriptions. Patient is not currently taking opioid prescriptions. Functional ability and status Nutritional status Physical activity Advanced directives List of other physicians Hospitalizations, surgeries, and ER visits in previous 12 months Vitals Screenings to include cognitive, depression, and falls Referrals and appointments  In addition, I have reviewed and discussed with patient certain preventive protocols, quality metrics, and best practice recommendations. A written personalized care plan for preventive services as well as general preventive health recommendations were provided to patient.     TinWillette BracePN   1/87/6/7209Nurse Notes: none

## 2023-01-28 ENCOUNTER — Encounter: Payer: Self-pay | Admitting: Family Medicine

## 2023-01-31 NOTE — Telephone Encounter (Signed)
See note

## 2023-01-31 NOTE — Telephone Encounter (Signed)
WE have never prescribed this before. Can we clarify with patient?   [name]. Jerline Pain, MD 01/31/2023 1:04 PM

## 2023-02-07 ENCOUNTER — Ambulatory Visit (INDEPENDENT_AMBULATORY_CARE_PROVIDER_SITE_OTHER): Payer: Medicare Other | Admitting: Family Medicine

## 2023-02-07 ENCOUNTER — Encounter: Payer: Self-pay | Admitting: Family Medicine

## 2023-02-07 VITALS — BP 114/76 | HR 57 | Temp 97.5°F | Ht 70.0 in | Wt 166.2 lb

## 2023-02-07 DIAGNOSIS — R739 Hyperglycemia, unspecified: Secondary | ICD-10-CM

## 2023-02-07 DIAGNOSIS — G4730 Sleep apnea, unspecified: Secondary | ICD-10-CM

## 2023-02-07 DIAGNOSIS — E785 Hyperlipidemia, unspecified: Secondary | ICD-10-CM | POA: Diagnosis not present

## 2023-02-07 DIAGNOSIS — Z125 Encounter for screening for malignant neoplasm of prostate: Secondary | ICD-10-CM | POA: Diagnosis not present

## 2023-02-07 DIAGNOSIS — Z0001 Encounter for general adult medical examination with abnormal findings: Secondary | ICD-10-CM

## 2023-02-07 DIAGNOSIS — G4700 Insomnia, unspecified: Secondary | ICD-10-CM | POA: Diagnosis not present

## 2023-02-07 DIAGNOSIS — N529 Male erectile dysfunction, unspecified: Secondary | ICD-10-CM

## 2023-02-07 LAB — HEMOGLOBIN A1C: Hgb A1c MFr Bld: 5.3 % (ref 4.6–6.5)

## 2023-02-07 LAB — LIPID PANEL
Cholesterol: 166 mg/dL (ref 0–200)
HDL: 75.5 mg/dL (ref >39.00)
LDL Cholesterol: 78 mg/dL (ref 0–99)
NonHDL: 90.93
Total CHOL/HDL Ratio: 2
Triglycerides: 64 mg/dL (ref 0.0–149.0)
VLDL: 12.8 mg/dL (ref 0.0–40.0)

## 2023-02-07 LAB — PSA: PSA: 2.73 ng/mL (ref 0.10–4.00)

## 2023-02-07 NOTE — Assessment & Plan Note (Signed)
Will be referring for sleep study as above.

## 2023-02-07 NOTE — Patient Instructions (Signed)
It was very nice to see you today!  Will check blood work today.  I will refer you for a sleep study.  Please work on exercises for your jaw.  Please keep up the good work with your diet and exercise.  I will see you back in year for your next physical.  Come back sooner if needed.  Take care, Dr Jerline Pain  PLEASE NOTE:  If you had any lab tests, please let us know if you have not heard back within a few days. You may see your results on mychart before we have a chance to review them but we will give you a call once they are reviewed by [place].   If we ordered any referrals today, please let us know if you have not heard from their office within the next week.   If you had any urgent prescriptions sent in today, please check with the pharmacy within an hour of our visit to make sure the prescription was transmitted appropriately.   Please try these tips to maintain a healthy lifestyle:  Eat at least 3 REAL meals and 1-2 snacks per day.  Aim for no more than 5 hours between eating.  If you eat breakfast, please do so within one hour of getting up.   Each meal should contain half fruits/vegetables, one quarter protein, and one quarter carbs (no bigger than a computer mouse)  Cut down on sweet beverages. This includes juice, soda, and sweet tea.   Drink at least 1 glass of water with each meal and aim for at least 8 glasses per day  Exercise at least 150 minutes every week.    Preventive Care 58 Years and Older, Male Preventive care refers to lifestyle choices and visits with your health care provider that can promote health and wellness. Preventive care visits are also called wellness exams. What can I expect for my preventive care visit? Counseling During your preventive care visit, your health care provider may ask about your: Medical history, including: Past medical problems. Family medical history. History of falls. Current health, including: Emotional well-being. Home life  and relationship well-being. Sexual activity. Memory and ability to understand (cognition). Lifestyle, including: Alcohol, nicotine or tobacco, and drug use. Access to firearms. Diet, exercise, and sleep habits. Work and work Statistician. Sunscreen use. Safety issues such as seatbelt and bike helmet use. Physical exam Your health care provider will check your: Height and weight. These may be used to calculate your BMI (body mass index). BMI is a measurement that tells if you are at a healthy weight. Waist circumference. This measures the distance around your waistline. This measurement also tells if you are at a healthy weight and may help predict your risk of certain diseases, such as type 2 diabetes and high blood pressure. Heart rate and blood pressure. Body temperature. Skin for abnormal spots. What immunizations do I need?  Vaccines are usually given at various ages, according to a schedule. Your health care provider will recommend vaccines for you based on your age, medical history, and lifestyle or other factors, such as travel or where you work. What tests do I need? Screening Your health care provider may recommend screening tests for certain conditions. This may include: Lipid and cholesterol levels. Diabetes screening. This is done by checking your blood sugar (glucose) after you have not eaten for a while (fasting). Hepatitis C test. Hepatitis B test. HIV (human immunodeficiency virus) test. STI (sexually transmitted infection) testing, if you are at risk. Lung  cancer screening. Colorectal cancer screening. Prostate cancer screening. Abdominal aortic aneurysm (AAA) screening. You may need this if you are a current or former smoker. Talk with your health care provider about your test results, treatment options, and if necessary, the need for more tests. Follow these instructions at home: Eating and drinking  Eat a diet that includes fresh fruits and vegetables, whole  grains, lean protein, and low-fat dairy products. Limit your intake of foods with high amounts of sugar, saturated fats, and salt. Take vitamin and mineral supplements as recommended by your health care provider. Do not drink alcohol if your health care provider tells you not to drink. If you drink alcohol: Limit how much you have to 0-2 drinks a day. Know how much alcohol is in your drink. In the [place] one drink equals one 12 oz bottle of beer (355 mL), one 5 oz glass of wine (148 mL), or one 1 oz glass of hard liquor (44 mL). Lifestyle Brush your teeth every morning and night with fluoride toothpaste. Floss one time each day. Exercise for at least 30 minutes 5 or more days each week. Do not use any products that contain nicotine or tobacco. These products include cigarettes, chewing tobacco, and vaping devices, such as e-cigarettes. If you need help quitting, ask your health care provider. Do not use drugs. If you are sexually active, practice safe sex. Use a condom or other form of protection to prevent STIs. Take aspirin only as told by your health care provider. Make sure that you understand how much to take and what form to take. Work with your health care provider to find out whether it is safe and beneficial for you to take aspirin daily. Ask your health care provider if you need to take a cholesterol-lowering medicine (statin). Find healthy ways to manage stress, such as: Meditation, yoga, or listening to music. Journaling. Talking to a trusted person. Spending time with friends and family. Safety Always wear your seat belt while driving or riding in a vehicle. Do not drive: If you have been drinking alcohol. Do not ride with someone who has been drinking. When you are tired or distracted. While texting. If you have been using any mind-altering substances or drugs. Wear a helmet and other protective equipment during sports activities. If you have firearms in your house, make sure  you follow all gun safety procedures. Minimize exposure to UV radiation to reduce your risk of skin cancer. What's next? Visit your health care provider once a year for an annual wellness visit. Ask your health care provider how often you should have your eyes and teeth checked. Stay up to date on all vaccines. This information is not intended to replace advice given to you by your health care provider. Make sure you discuss any questions you have with your health care provider. Document Revised: 06/14/2021 Document Reviewed: 06/14/2021 Elsevier Patient Education  [place].  Jaw Range of Motion Exercises Jaw range of motion exercises are exercises that help your jaw move better. Exercises that help you have good posture (postural exercises) also help relieve jaw discomfort. These are often done along with range of motion exercises. These exercises can help prevent or improve: Trouble opening your mouth. Pain in your jaw while it is open or closed. Temporomandibular joint (TMJ) pain. Headache caused by jaw tension. Take other actions to prevent or relieve jaw pain, such as: Avoiding things that cause or increase jaw pain. This may include: Chewing gum or eating hard  foods. Clenching your jaw or teeth, grinding your teeth, or keeping tension in your jaw muscles. Opening your mouth wide, such as for a big yawn. Leaning on your jaw, such as resting your jaw in your hand while leaning on a desk. Putting ice on your jaw. To do this: Put ice in a plastic bag. Place a towel between your skin and the bag. Leave the ice on for 20 minutes, 2-3 times a day. Remove the ice if your skin turns bright red. This is very important. If you cannot feel pain, heat, or cold, you have a greater risk of damage to the area. Only do jaw exercises that your health care provider recommends. Only move your jaw as far as it can comfortably go in each direction. Do not move your jaw into positions that cause  pain. Range of motion exercises Repeat each of these exercises 8 times, 1-2 times a day, or as told by your health care provider. Exercise A: Forward protrusion  Push your jaw forward. Hold this position for 1-2 seconds. Let your jaw return to its normal position and rest it there for 1-2 seconds. Exercise B: Controlled opening  Stand or sit in front of a mirror. Place your tongue on the roof of your mouth, just behind your top teeth. Keeping your tongue on the roof of your mouth, slowly open and close your mouth. While you open and close your mouth, watch your jaw in the mirror. Try to keep your jaw from moving to one side or the other. Exercise C: Right and left motion  Move your jaw right. Hold this position for 1-2 seconds. Let your jaw return to its normal position, and rest it there for 1-2 seconds. Move your jaw left. Hold this position for 1-2 seconds. Let your jaw return to its normal position, and rest it there for 1-2 seconds. Postural exercises Exercise A: Chin tucks  You can do this exercise sitting, standing, or lying down. Move your head straight back, keeping your head level. You can guide the movement by placing your fingers on your chin to push your jaw back in an even motion. You should be able to feel a double chin form at the end of the motion. Hold this position for 5 seconds. Repeat 10-15 times. Exercise B: Shoulder blade squeeze  Sit or stand. Bend your elbows to about 90 degrees, which is the shape of a capital letter "L." Keep your upper arms by your body. Squeeze your shoulder blades down and back, as though you were trying to touch your elbows behind you. Do not shrug your shoulders or move your head. Hold this position for 5 seconds. Repeat 10-15 times. Exercise C: Chest stretch  Stand in a doorway with one of your feet slightly in front of the other. This is called a staggered stance. If you cannot reach your forearms to the door frame, do this exercise in  a corner of a room. Put both of your hands and your forearms on the door frame, with your arms wide apart. Make sure your arms are at a 90-degree angle to your body. Place your hands on the door frame at the height of your elbows. Slowly move your weight onto your front foot until you feel a stretch across your chest and in the front of your shoulders. Keep your head and chest upright and keep your abdominal muscles tight. Do not lean in. Hold this position for 30 seconds. Repeat 3 times. Contact a health  care provider if: You have jaw pain that is new or gets worse. You have clicking or popping sounds while doing the exercises. Get help right away if: Your jaw is stuck in one place and you cannot move it. You cannot open or close your mouth. Summary Jaw range of motion exercises are exercises that help your jaw move better. Take actions to prevent or relieve jaw pain: limit chewing gum or eating hard foods; clenching your jaw or teeth; or leaning on your jaw, such as resting your jaw in your hand while leaning on a desk. Repeat each of the jaw range of motion exercises 8 times, 1-2 times a day, or as told by your health care provider. Contact a health care provider if you have clicking or popping sounds while doing the exercises. This information is not intended to replace advice given to you by your health care provider. Make sure you discuss any questions you have with your health care provider. Document Revised: 07/30/2021 Document Reviewed: 07/30/2021 Elsevier Patient Education  [place].

## 2023-02-07 NOTE — Assessment & Plan Note (Signed)
Check A1c. 

## 2023-02-07 NOTE — Assessment & Plan Note (Signed)
Check lipids 

## 2023-02-07 NOTE — Progress Notes (Signed)
Chief Complaint:  Kristopher Martinez is a 77 y.o. male who presents today for his annual comprehensive physical exam.    Assessment/Plan:  New/Acute Problems: Jaw clicking No red flags.  May have mild underlying TMJ.  We discussed isometric exercises.  He can follow-up with dentistry if not improving.  Sleep disordered breathing. Concern for possible underlying OSA.  Does feel tired throughout the day.  Occasionally naps.  Wife does notice he snores.  Will place referral for sleep study.  Chronic Problems Addressed Today: Insomnia Will be referring for sleep study as above.  Hyperglycemia Check A1c.  Dyslipidemia Check lipids.  Erectile dysfunction On Cialis as needed.  Check PSA per patient request.  Preventative Healthcare: Check labs today.  Patient is interested in PSA screening.  Last was normal.  We did discuss current recommendations do not check PSA in men above 69 year old however he would like to proceed.  Colonoscopy was updated 2 years ago.  Needs repeat next year.  Up-to-date on other vaccines.  Patient Counseling(The following topics were reviewed and/or handout was given):  -Nutrition: Stressed importance of moderation in sodium/caffeine intake, saturated fat and cholesterol, caloric balance, sufficient intake of fresh fruits, vegetables, and fiber.  -Stressed the importance of regular exercise.   -Substance Abuse: Discussed cessation/primary prevention of tobacco, alcohol, or other drug use; driving or other dangerous activities under the influence; availability of treatment for abuse.   -Injury prevention: Discussed safety belts, safety helmets, smoke detector, smoking near bedding or upholstery.   -Sexuality: Discussed sexually transmitted diseases, partner selection, use of condoms, avoidance of unintended pregnancy and contraceptive alternatives.   -Dental health: Discussed importance of regular tooth brushing, flossing, and dental visits.  -Health maintenance  and immunizations reviewed. Please refer to Health maintenance section.  Return to care in 1 year for next preventative visit.     Subjective:  HPI:  He has no acute complaints today.   See A/p for status of chronic conditions.   He has been having some jaw clicking  for a few weeks. He is concerned  about TMJ.  No medications tried.   He has also having some issues with his sleep. He has been taking OTC antihistamines which have not been helping. He does occasionally snore. He has tried using nasal strips with some improvement. Does nap during day. Sometimes does not feel like sleep is refreshing.   Lifestyle Diet: Balanced. Plenty of fruits  Exercise: Walking routinely.      02/07/2023   10:05 AM  Depression screen PHQ 2/9  Decreased Interest 0  Down, Depressed, Hopeless 0  PHQ - 2 Score 0   There are no preventive care reminders to display for this patient.   ROS: Per HPI, otherwise a complete review of systems was negative.   PMH:  The following were reviewed and entered/updated in epic: Past Medical History:  Diagnosis Date   Flu    03/23/14     Hemorrhoid    Hyperlipidemia    IBS (irritable bowel syndrome)    Low back pain    Sinusitis    03/23/14   Syncope 1995   did all testing-post-prandal-pt.states was told had long QT segment   Patient Active Problem List   Diagnosis Date Noted   Hyperglycemia 12/08/2020   Long Q-T syndrome 06/07/2020   Osteoarthritis 06/03/2020   Insomnia 08/13/2019   Eczema 01/30/2019   Seborrheic dermatitis 01/30/2019   Erectile dysfunction 01/30/2019   Dyslipidemia 09/29/2007   Past Surgical History:  Procedure Laterality Date   APPENDECTOMY     COLONOSCOPY  2008   HYPERPLASTIC POLYPS X 8    COLONOSCOPY  02/05/2018   Dr.Stark   EYE SURGERY     strabismus as child   HYPERPLASIA TISSUE EXCISION     atypical lymphoid    INGUINAL HERNIA REPAIR Right 04/08/2014   Procedure: LAPAROSCOPIC RIGHT INGUINAL HERNIA REPAIR;   Surgeon: Gayland Curry, MD;  Location: [facility];  Service: General;  Laterality: Right;   INSERTION OF MESH Right 04/08/2014   Procedure: INSERTION OF MESH;  Surgeon: Gayland Curry, MD;  Location: [facility];  Service: General;  Laterality: Right;   POLYPECTOMY     TONSILLECTOMY     WISDOM TOOTH EXTRACTION      Family History  Problem Relation Age of Onset   Colitis Mother    Alcohol abuse Mother    Cancer Father        prostate cancer   Prostate cancer Father    Prostate cancer Other    Colon cancer Neg Hx    Colon polyps Neg Hx    Esophageal cancer Neg Hx    Rectal cancer Neg Hx    Stomach cancer Neg Hx     Medications- reviewed and updated Current Outpatient Medications  Medication Sig Dispense Refill   acetic acid-hydrocortisone (VOSOL-HC) OTIC solution Place 4 drops into both ears 4 (four) times daily. 10 mL 5   atorvastatin (LIPITOR) 20 MG tablet Take 1 tablet (20 mg total) by mouth daily. 90 tablet 1   desonide (DESOWEN) 0.05 % cream APPLY TO AFFECTED AREA TWICE A DAY 30 g 0   ibuprofen (ADVIL,MOTRIN) 200 MG tablet Take 200 mg by mouth every 6 (six) hours as needed for mild pain.     OVER THE COUNTER MEDICATION Take 1 tablet by mouth at bedtime. MELATONIN     polyethylene glycol (MIRALAX / GLYCOLAX) 17 g packet Take 17 g by mouth daily as needed for moderate constipation. 14 each 0   tadalafil (CIALIS) 10 MG tablet TAKE ONE TABLET BY MOUTH DAILY AS NEEDED FOR ERECTILE DYSFUNCTION 30 tablet 0   triamcinolone cream (KENALOG) 0.1 % Apply topically 2 (two) times daily. 60 g 0   No current facility-administered medications for this visit.    Allergies-reviewed and updated Allergies  Allergen Reactions   Augmentin [Amoxicillin-Pot Clavulanate] Other (See Comments)    Nausea, vomiting, dehydration had to go to hospital.   Amoxicillin    Trazodone And Nefazodone     Nausea and flushing. Possible passing out?   Cleocin [Clindamycin Hcl] Rash    Social History    Socioeconomic History   Marital status: Married    Spouse name: Not on file   Number of children: 0   Years of education: Not on file   Highest education level: Not on file  Occupational History   Not on file  Tobacco Use   Smoking status: Former    Types: Cigarettes    Quit date: 12/31/1976    Years since quitting: 46.1   Smokeless tobacco: Never  Vaping Use   Vaping Use: Never used  Substance and Sexual Activity   Alcohol use: Yes    Alcohol/week: 2.0 standard drinks of alcohol    Types: 2 Glasses of wine per week   Drug use: No   Sexual activity: Yes  Other Topics Concern   Not on file  Social History Narrative   Not on file   Social Determinants of  Health   Financial Resource Strain: Not on file  Food Insecurity: No Food Insecurity (01/07/2023)   Hunger Vital Sign    Worried About Running Out of Food in the Last Year: Never true    Ran Out of Food in the Last Year: Never true  Transportation Needs: No Transportation Needs (01/07/2023)   PRAPARE - Hydrologist (Medical): No    Lack of Transportation (Non-Medical): No  Physical Activity: Insufficiently Active (01/07/2023)   Exercise Vital Sign    Days of Exercise per Week: 3 days    Minutes of Exercise per Session: 30 min  Stress: No Stress Concern Present (01/07/2023)   [place]    Feeling of Stress : Only a little  Social Connections: Unknown (01/03/2023)   Social Connection and Isolation Panel [NHANES]    Frequency of Communication with Friends and Family: Once a week    Frequency of Social Gatherings with Friends and Family: Not on file    Attends Religious Services: Not on Advertising copywriter or Organizations: Not on file    Attends Archivist Meetings: Not on file    Marital Status: Not on file        Objective:  Physical Exam: BP 114/76   Pulse (!) 57   Temp (!) 97.5 F (36.4 C) (Temporal)    Ht '5\' 10"'$  (1.778 m)   Wt 166 lb 3.2 oz (75.4 kg)   SpO2 96%   BMI 23.85 kg/m   Body mass index is 23.85 kg/m. Wt Readings from Last 3 Encounters:  02/07/23 166 lb 3.2 oz (75.4 kg)  01/07/23 164 lb (74.4 kg)  12/03/22 165 lb 3.2 oz (74.9 kg)   Gen: NAD, resting comfortably HEENT: TMs normal bilaterally. OP clear. No thyromegaly noted.  Palpable clicking noted on right TMJ with opening of jaw.  No pain. CV: RRR with no murmurs appreciated Pulm: NWOB, CTAB with no crackles, wheezes, or rhonchi GI: Normal bowel sounds present. Soft, Nontender, Nondistended. MSK: no edema, cyanosis, or clubbing noted Skin: warm, dry Neuro: CN2-12 grossly intact. Strength 5/5 in upper and lower extremities. Reflexes symmetric and intact bilaterally.  Psych: Normal affect and thought content     Mariabella Nilsen M. Jerline Pain, MD 02/07/2023 10:43 AM

## 2023-02-07 NOTE — Assessment & Plan Note (Signed)
On Cialis as needed.  Check PSA per patient request.

## 2023-02-08 NOTE — Progress Notes (Signed)
Please inform patient of the following:  Great news! Labs are all stable. We can recheck in a year.  [name]. Jerline Pain, MD 02/08/2023 12:56 PM

## 2023-02-15 ENCOUNTER — Encounter: Payer: Self-pay | Admitting: Family Medicine

## 2023-03-22 DIAGNOSIS — Z961 Presence of intraocular lens: Secondary | ICD-10-CM | POA: Diagnosis not present

## 2023-03-22 DIAGNOSIS — H52203 Unspecified astigmatism, bilateral: Secondary | ICD-10-CM | POA: Diagnosis not present

## 2023-04-12 ENCOUNTER — Other Ambulatory Visit: Payer: Self-pay | Admitting: Family Medicine

## 2023-05-01 ENCOUNTER — Other Ambulatory Visit: Payer: Self-pay | Admitting: Family Medicine

## 2023-07-24 ENCOUNTER — Other Ambulatory Visit: Payer: Self-pay | Admitting: Family Medicine

## 2023-09-06 ENCOUNTER — Other Ambulatory Visit: Payer: Self-pay | Admitting: Family Medicine

## 2023-09-06 MED ORDER — TADALAFIL 10 MG PO TABS: 10.0000 mg | ORAL_TABLET | Freq: Every day | ORAL | 0 refills | Status: AC | PRN

## 2023-09-06 NOTE — Telephone Encounter (Signed)
Sig needed.

## 2023-09-18 DIAGNOSIS — H2513 Age-related nuclear cataract, bilateral: Secondary | ICD-10-CM | POA: Diagnosis not present

## 2023-09-19 ENCOUNTER — Encounter: Payer: Self-pay | Admitting: Family Medicine

## 2023-10-08 NOTE — Telephone Encounter (Signed)
No further action needed.

## 2023-10-22 ENCOUNTER — Other Ambulatory Visit: Payer: Self-pay | Admitting: Family Medicine

## 2024-01-22 ENCOUNTER — Other Ambulatory Visit: Payer: Self-pay | Admitting: Family Medicine

## 2024-02-04 ENCOUNTER — Encounter: Payer: Self-pay | Admitting: Family Medicine

## 2024-02-04 NOTE — Telephone Encounter (Signed)
Please schedule an office visit for evaluation and tx  Thanks

## 2024-02-06 ENCOUNTER — Encounter: Payer: Self-pay | Admitting: Family Medicine

## 2024-02-06 ENCOUNTER — Ambulatory Visit: Payer: Medicare Other | Admitting: Family Medicine

## 2024-02-06 VITALS — BP 136/80 | HR 68 | Temp 97.9°F | Ht 70.0 in | Wt 164.0 lb

## 2024-02-06 DIAGNOSIS — I4581 Long QT syndrome: Secondary | ICD-10-CM

## 2024-02-06 DIAGNOSIS — R739 Hyperglycemia, unspecified: Secondary | ICD-10-CM | POA: Diagnosis not present

## 2024-02-06 DIAGNOSIS — J329 Chronic sinusitis, unspecified: Secondary | ICD-10-CM | POA: Diagnosis not present

## 2024-02-06 MED ORDER — DOXYCYCLINE HYCLATE 100 MG PO TABS: 100.0000 mg | ORAL_TABLET | Freq: Two times a day (BID) | ORAL | 0 refills | Status: DC

## 2024-02-06 MED ORDER — AZELASTINE HCL 0.1 % NA SOLN: 2.0000 | Freq: Two times a day (BID) | NASAL | 12 refills | Status: DC

## 2024-02-06 MED ORDER — BENZONATATE 200 MG PO CAPS: 200.0000 mg | ORAL_CAPSULE | Freq: Two times a day (BID) | ORAL | 0 refills | Status: DC | PRN

## 2024-02-06 NOTE — Patient Instructions (Signed)
 It was very nice to see you today!  I think the sinus infection.  Will start the doxycycline .  Take the Tessalon  and Astelin  as well.  Let us  know if not improving in the next 1 to 2 weeks.  Return if symptoms worsen or fail to improve.   Take care, Dr Kennyth  PLEASE NOTE:  If you had any lab tests, please let us  know if you have not heard back within a few days. You may see your results on mychart before we have a chance to review them but we will give you a call once they are reviewed by us .   If we ordered any referrals today, please let us  know if you have not heard from their office within the next week.   If you had any urgent prescriptions sent in today, please check with the pharmacy within an hour of our visit to make sure the prescription was transmitted appropriately.   Please try these tips to maintain a healthy lifestyle:  Eat at least 3 REAL meals and 1-2 snacks per day.  Aim for no more than 5 hours between eating.  If you eat breakfast, please do so within one hour of getting up.   Each meal should contain half fruits/vegetables, one quarter protein, and one quarter carbs (no bigger than a computer mouse)  Cut down on sweet beverages. This includes juice, soda, and sweet tea.   Drink at least 1 glass of water with each meal and aim for at least 8 glasses per day  Exercise at least 150 minutes every week.

## 2024-02-06 NOTE — Progress Notes (Signed)
   Kristopher Martinez is a 78 y.o. male who presents today for an office visit.  Assessment/Plan:  New/Acute Problems: Sinusitis  No red flags.  Given length of symptoms would be reasonable to treat with antibiotic.  Has allergy to amoxicillin .  Cannot take azithromycin due to long QTc.  Will start doxycycline .  Also start Astelin  nasal spray.  Also send in Tessalon  for cough.  Encouraged hydration.  He can continue other over-the-counter meds as needed as well.  Recommended he avoid prolonged use of Afrin.  We discussed reasons to return to care.  Follow-up as needed.  Chronic Problems Addressed Today: Long Q-T syndrome Along with cardiology for this.  We are avoiding QTc prolonging medications such as azithromycin.  Hyperglycemia He will come back soon for physical and we can check A1c at that time.     Subjective:  HPI:  See Assessment / plan for status of chronic conditions. Patient here with cough and congestion for the last month.  Covid test was negative. Tried using OTC spray with some improvement. He did have a low grade fever that has since resolved. Some myaglias. Symptoms have come and gone. No shortness of breath. No chest pain. No wheezing. h       Objective:  Physical Exam: BP 136/80   Pulse 68   Temp 97.9 F (36.6 C) (Temporal)   Ht 5' 10 (1.778 m)   Wt 164 lb (74.4 kg)   SpO2 96%   BMI 23.53 kg/m   Gen: No acute distress, resting comfortably HEENT: TMs with clear effusion.  OP erythematous.  Nasal mucosa erythematous and boggy bilaterally. CV: Regular rate and rhythm with no murmurs appreciated Pulm: Normal work of breathing, clear to auscultation bilaterally with no crackles, wheezes, or rhonchi Neuro: Grossly normal, moves all extremities Psych: Normal affect and thought content      Edgerrin Correia M. Kennyth, MD 02/06/2024 12:03 PM

## 2024-02-06 NOTE — Assessment & Plan Note (Signed)
 Along with cardiology for this.  We are avoiding QTc prolonging medications such as azithromycin.

## 2024-02-06 NOTE — Assessment & Plan Note (Signed)
 He will come back soon for physical and we can check A1c at that time.

## 2024-02-12 ENCOUNTER — Ambulatory Visit: Payer: Medicare Other

## 2024-02-18 ENCOUNTER — Other Ambulatory Visit: Payer: Self-pay | Admitting: Family Medicine

## 2024-02-18 ENCOUNTER — Other Ambulatory Visit: Payer: Self-pay | Admitting: *Deleted

## 2024-02-18 MED ORDER — DESONIDE 0.05 % EX CREA: TOPICAL_CREAM | CUTANEOUS | 0 refills | Status: AC

## 2024-02-18 NOTE — Telephone Encounter (Signed)
He can get this OTC

## 2024-02-18 NOTE — Telephone Encounter (Signed)
 Pharmacy comment: Alternative Requested:THE PRESCRIBED MEDICATION IS NOT COVERED BY INSURANCE. PLEASE CONSIDER CHANGING TO ONE OF THE SUGGESTED COVERED ALTERNATIVES.

## 2024-02-19 ENCOUNTER — Other Ambulatory Visit: Payer: Self-pay | Admitting: *Deleted

## 2024-02-19 NOTE — Telephone Encounter (Signed)
Error Rx already sent

## 2024-03-19 ENCOUNTER — Encounter: Payer: Self-pay | Admitting: Family Medicine

## 2024-03-20 NOTE — Telephone Encounter (Signed)
 Please call patient to schedule physical

## 2024-03-30 ENCOUNTER — Encounter: Payer: Medicare Other | Admitting: Family Medicine

## 2024-03-31 ENCOUNTER — Encounter: Payer: Medicare Other | Admitting: Family Medicine

## 2024-04-22 ENCOUNTER — Ambulatory Visit (INDEPENDENT_AMBULATORY_CARE_PROVIDER_SITE_OTHER)

## 2024-04-22 ENCOUNTER — Other Ambulatory Visit: Payer: Self-pay | Admitting: Family Medicine

## 2024-04-22 VITALS — Ht 70.0 in | Wt 164.0 lb

## 2024-04-22 DIAGNOSIS — Z0000 Encounter for general adult medical examination without abnormal findings: Secondary | ICD-10-CM

## 2024-04-22 NOTE — Patient Instructions (Signed)
 Kristopher Martinez , Thank you for taking time to come for your Medicare Wellness Visit. I appreciate your ongoing commitment to your health goals. Please review the following plan we discussed and let me know if I can assist you in the future.   Referrals/Orders/Follow-Ups/Clinician Recommendations: maintain health and activity   This is a list of the screening recommended for you and due dates:  Health Maintenance  Topic Date Due   Medicare Annual Wellness Visit  01/08/2024   COVID-19 Vaccine (9 - 2024-25 season) 03/18/2024   Flu Shot  07/31/2024   Colon Cancer Screening  10/11/2024   DTaP/Tdap/Td vaccine (4 - Td or Tdap) 08/16/2029   Pneumonia Vaccine  Completed   Hepatitis C Screening  Completed   HPV Vaccine  Aged Out   Meningitis B Vaccine  Aged Out   Zoster (Shingles) Vaccine  Discontinued    Advanced directives: (Copy Requested) Please bring a copy of your health care power of attorney and living will to the office to be added to your chart at your convenience. You can mail to [facility] [address] [place], [place] [address] or email to [email or web address]  Next Medicare Annual Wellness Visit scheduled for next year: Yes

## 2024-04-22 NOTE — Progress Notes (Signed)
 Subjective:   Kristopher Martinez is a 78 y.o. who presents for a Medicare Wellness preventive visit.  Visit Complete: Virtual I connected with  Edris Gowers on 04/22/24 by a audio enabled telemedicine application and verified that I am speaking with the correct person using two identifiers.  Patient Location: Home  Provider Location: Home Office  I discussed the limitations of evaluation and management by telemedicine. The patient expressed understanding and agreed to proceed.  Vital Signs: Because this visit was a virtual/telehealth visit, some criteria may be missing or patient reported. Any vitals not documented were not able to be obtained and vitals that have been documented are patient reported.  VideoDeclined- This patient declined Librarian, academic. Therefore the visit was completed with audio only.  Persons Participating in Visit: Patient.  AWV Questionnaire: No: Patient Medicare AWV questionnaire was not completed prior to this visit.  Cardiac Risk Factors include: advanced age (>73men, >52 women);dyslipidemia;male gender     Objective:    Today's Vitals   04/22/24 1303  Weight: 164 lb (74.4 kg)  Height: 5\' 10"  (1.778 m)   Body mass index is 23.53 kg/m.     04/22/2024    1:07 PM 01/07/2023   11:52 AM 12/18/2021   11:54 AM 10/20/2021    7:31 PM 10/11/2021   10:39 AM 08/17/2019    5:28 AM 02/05/2018    7:12 AM  Advanced Directives  Does Patient Have a Medical Advance Directive? Yes Yes Yes No Yes No Yes  Type of Estate agent of [place];Living will Healthcare Power of [place];Living will Healthcare Power of [employer] of [place];Living will  Healthcare Power of [place];Living will  Does patient want to make changes to medical advance directive?    No - Patient declined No - Patient declined    Copy of Healthcare Power of Attorney in Chart? No - copy requested No - copy requested No - copy  requested  No - copy requested  Yes  Would patient like information on creating a medical advance directive?    No - Patient declined       Current Medications (verified) Outpatient Encounter Medications as of 04/22/2024  Medication Sig   acetic acid -hydrocortisone  (VOSOL -HC) OTIC solution Place 4 drops into both ears 4 (four) times daily.   atorvastatin  (LIPITOR) 20 MG tablet TAKE ONE TABLET BY MOUTH ONCE A DAY   azelastine  (ASTELIN ) 0.1 % nasal spray Place 2 sprays into both nostrils 2 (two) times daily.   benzonatate  (TESSALON ) 200 MG capsule Take 1 capsule (200 mg total) by mouth 2 (two) times daily as needed for cough.   desonide  (DESOWEN ) 0.05 % cream APPLY TO AFFECTED AREA TWICE A DAY   doxycycline  (VIBRA -TABS) 100 MG tablet Take 1 tablet (100 mg total) by mouth 2 (two) times daily.   ibuprofen (ADVIL,MOTRIN) 200 MG tablet Take 200 mg by mouth every 6 (six) hours as needed for mild pain.   OVER THE COUNTER MEDICATION Take 1 tablet by mouth at bedtime. MELATONIN   tadalafil  (CIALIS ) 10 MG tablet Take 1 tablet (10 mg total) by mouth daily as needed for erectile dysfunction.   triamcinolone  cream (KENALOG ) 0.1 % APPLY TOPICALLY TWICE A DAY   No facility-administered encounter medications on file as of 04/22/2024.    Allergies (verified) Augmentin  [amoxicillin -pot clavulanate], Amoxicillin , Trazodone  and nefazodone, and Cleocin [clindamycin hcl]   History: Past Medical History:  Diagnosis Date   Flu    03/23/14  Hemorrhoid    Hyperlipidemia    IBS (irritable bowel syndrome)    Low back pain    Sinusitis    03/23/14   Syncope 1995   did all testing-post-prandal-pt.states was told had long QT segment   Past Surgical History:  Procedure Laterality Date   APPENDECTOMY     COLONOSCOPY  2008   HYPERPLASTIC POLYPS X 8    COLONOSCOPY  02/05/2018   Dr.Stark   EYE SURGERY     strabismus as child   HYPERPLASIA TISSUE EXCISION     atypical lymphoid    INGUINAL HERNIA REPAIR  Right 04/08/2014   Procedure: LAPAROSCOPIC RIGHT INGUINAL HERNIA REPAIR;  Surgeon: Fran Imus, MD;  Location: [facility];  Service: General;  Laterality: Right;   INSERTION OF MESH Right 04/08/2014   Procedure: INSERTION OF MESH;  Surgeon: Fran Imus, MD;  Location: [facility];  Service: General;  Laterality: Right;   POLYPECTOMY     TONSILLECTOMY     WISDOM TOOTH EXTRACTION     Family History  Problem Relation Age of Onset   Colitis Mother    Alcohol abuse Mother    Cancer Father        prostate cancer   Prostate cancer Father    Prostate cancer Other    Colon cancer Neg Hx    Colon polyps Neg Hx    Esophageal cancer Neg Hx    Rectal cancer Neg Hx    Stomach cancer Neg Hx    Social History   Socioeconomic History   Marital status: Married    Spouse name: Not on file   Number of children: 0   Years of education: Not on file   Highest education level: Master's degree (e.g., MA, MS, MEng, MEd, MSW, MBA)  Occupational History   Not on file  Tobacco Use   Smoking status: Former    Current packs/day: 0.00    Types: Cigarettes    Quit date: 12/31/1976    Years since quitting: 47.3   Smokeless tobacco: Never  Vaping Use   Vaping status: Never Used  Substance and Sexual Activity   Alcohol use: Yes    Alcohol/week: 2.0 standard drinks of alcohol    Types: 2 Glasses of wine per week   Drug use: No   Sexual activity: Yes  Other Topics Concern   Not on file  Social History Narrative   Not on file   Social Drivers of Health   Financial Resource Strain: Low Risk  (04/22/2024)   Overall Financial Resource Strain (CARDIA)    Difficulty of Paying Living Expenses: Not hard at all  Food Insecurity: No Food Insecurity (04/22/2024)   Hunger Vital Sign    Worried About Running Out of Food in the Last Year: Never true    Ran Out of Food in the Last Year: Never true  Transportation Needs: No Transportation Needs (04/22/2024)   PRAPARE - Administrator, Civil Service  (Medical): No    Lack of Transportation (Non-Medical): No  Physical Activity: Sufficiently Active (04/22/2024)   Exercise Vital Sign    Days of Exercise per Week: 3 days    Minutes of Exercise per Session: 60 min  Recent Concern: Physical Activity - Insufficiently Active (02/05/2024)   Exercise Vital Sign    Days of Exercise per Week: 3 days    Minutes of Exercise per Session: 30 min  Stress: No Stress Concern Present (04/22/2024)   [employer] of Occupational Health -  Occupational Stress Questionnaire    Feeling of Stress : Not at all  Recent Concern: Stress - Stress Concern Present (02/05/2024)   [employer] of Occupational Health - Occupational Stress Questionnaire    Feeling of Stress : To some extent  Social Connections: Moderately Integrated (04/22/2024)   Social Connection and Isolation Panel [NHANES]    Frequency of Communication with Friends and Family: Once a week    Frequency of Social Gatherings with Friends and Family: More than three times a week    Attends Religious Services: Never    Database administrator or Organizations: Yes    Attends Banker Meetings: 1 to 4 times per year    Marital Status: Married  Recent Concern: Social Connections - Moderately Isolated (02/05/2024)   Social Connection and Isolation Panel [NHANES]    Frequency of Communication with Friends and Family: Once a week    Frequency of Social Gatherings with Friends and Family: Once a week    Attends Religious Services: Never    Database administrator or Organizations: Yes    Attends Engineer, structural: More than 4 times per year    Marital Status: Married    Tobacco Counseling Counseling given: Not Answered    Clinical Intake:  Pre-visit preparation completed: Yes  Pain : No/denies pain     BMI - recorded: 23.53 Nutritional Status: BMI of 19-24  Normal Nutritional Risks: None Diabetes: No  Lab Results  Component Value Date   HGBA1C 5.3 02/07/2023    HGBA1C 5.4 12/14/2021   HGBA1C 5.1 12/08/2020     How often do you need to have someone help you when you read instructions, pamphlets, or other written materials from your doctor or pharmacy?: 1 - Never  Interpreter Needed?: No  Information entered by :: Lamont Pilsner, LPN   Activities of Daily Living     04/22/2024    1:06 PM  In your present state of health, do you have any difficulty performing the following activities:  Hearing? 0  Vision? 0  Difficulty concentrating or making decisions? 0  Walking or climbing stairs? 0  Dressing or bathing? 0  Doing errands, shopping? 0  Preparing Food and eating ? N  Using the Toilet? N  In the past six months, have you accidently leaked urine? N  Do you have problems with loss of bowel control? N  Managing your Medications? N  Managing your Finances? N  Housekeeping or managing your Housekeeping? N    Patient Care Team: Rodney Clamp, MD as PCP - General (Family Medicine)  Indicate any recent Medical Services you may have received from other than [facility] providers in the past year (date may be approximate).     Assessment:   This is a routine wellness examination for Kristopher Martinez.  Hearing/Vision screen Hearing Screening - Comments:: Pt has hearing aids  Vision Screening - Comments:: Wears rx glasses - up to date with routine eye exams with Dr Nelva Bang    Goals Addressed             This Visit's Progress    Patient Stated       Maintain health and activity        Depression Screen     04/22/2024    1:07 PM 02/06/2024   11:16 AM 02/07/2023   10:05 AM 01/07/2023   11:52 AM 12/03/2022   12:58 PM 05/22/2022    2:58 PM 04/18/2022    9:00 AM  PHQ 2/9 Scores  PHQ - 2 Score 0 0 0 0 0 0 0    Fall Risk     04/22/2024    1:10 PM 02/06/2024   11:16 AM 02/07/2023   10:05 AM 01/03/2023    8:40 AM 12/03/2022   12:58 PM  Fall Risk   Falls in the past year? 0 0 0 0 0  Number falls in past yr: 0 0 0 0 0  Injury with Fall? 0 0 0 0 0   Risk for fall due to : No Fall Risks No Fall Risks No Fall Risks Impaired vision No Fall Risks  Follow up Falls prevention discussed   Falls prevention discussed     MEDICARE RISK AT HOME:  Medicare Risk at Home Any stairs in or around the home?: Yes If so, are there any without handrails?: No Home free of loose throw rugs in walkways, pet beds, electrical cords, etc?: Yes Adequate lighting in your home to reduce risk of falls?: Yes Life alert?: No Use of a cane, walker or w/c?: No Grab bars in the bathroom?: No Shower chair or bench in shower?: No Elevated toilet seat or a handicapped toilet?: No  TIMED UP AND GO:  Was the test performed?  No  Cognitive Function: 6CIT completed        04/22/2024    1:10 PM 01/07/2023   11:54 AM 12/18/2021   11:56 AM  6CIT Screen  What Year? 0 points 0 points 0 points  What month? 0 points 0 points 0 points  What time? 0 points 0 points 0 points  Count back from 20 0 points 0 points 0 points  Months in reverse 0 points 0 points 0 points  Repeat phrase 0 points 0 points 0 points  Total Score 0 points 0 points 0 points    Immunizations Immunization History  Administered Date(s) Administered   Fluad Quad(high Dose 65+) 08/27/2019, 10/18/2020, 08/28/2022   Influenza Split 12/27/2011, 12/04/2012   Influenza, High Dose Seasonal PF 10/08/2013, 11/16/2014, 12/14/2015, 09/20/2016, 09/25/2017, 10/15/2018, 10/07/2023   Influenza,inj,Quad PF,6+ Mos 09/11/2021   Moderna Covid-19 Fall Seasonal Vaccine 49yrs & older 02/15/2023   Moderna Covid-19 Vaccine Bivalent Booster 34yrs & up 11/07/2021   Moderna Sars-Covid-2 Vaccination 01/30/2020, 02/29/2020, 10/28/2020, 04/21/2021   Pfizer Covid-19 Vaccine Bivalent Booster 8yrs & up 08/01/2022   Pneumococcal Conjugate-13 07/15/2014   Pneumococcal Polysaccharide-23 06/05/2013, 11/16/2019   Td 01/01/2004   Tdap 08/17/2019   Tetanus 07/15/2014   Unspecified SARS-COV-2 Vaccination 09/19/2023    Screening  Tests Health Maintenance  Topic Date Due   COVID-19 Vaccine (9 - 2024-25 season) 03/18/2024   INFLUENZA VACCINE  07/31/2024   Colonoscopy  10/11/2024   Medicare Annual Wellness (AWV)  04/22/2025   DTaP/Tdap/Td (4 - Td or Tdap) 08/16/2029   Pneumonia Vaccine 7+ Years old  Completed   Hepatitis C Screening  Completed   HPV VACCINES  Aged Out   Meningococcal B Vaccine  Aged Out   Zoster Vaccines- Shingrix  Discontinued    Health Maintenance  Health Maintenance Due  Topic Date Due   COVID-19 Vaccine (9 - 2024-25 season) 03/18/2024   Health Maintenance Items Addressed: See Nurse Notes  Additional Screening:  Vision Screening: Recommended annual ophthalmology exams for early detection of glaucoma and other disorders of the eye.  Dental Screening: Recommended annual dental exams for proper oral hygiene  Community Resource Referral / Chronic Care Management: CRR required this visit?  No   CCM required this visit?  No     Plan:     I have personally reviewed and noted the following in the patient's chart:   Medical and social history Use of alcohol, tobacco or illicit drugs  Current medications and supplements including opioid prescriptions. Patient is not currently taking opioid prescriptions. Functional ability and status Nutritional status Physical activity Advanced directives List of other physicians Hospitalizations, surgeries, and ER visits in previous 12 months Vitals Screenings to include cognitive, depression, and falls Referrals and appointments  In addition, I have reviewed and discussed with patient certain preventive protocols, quality metrics, and best practice recommendations. A written personalized care plan for preventive services as well as general preventive health recommendations were provided to patient.     Bruno Capri, LPN   2/44/0102   After Visit Summary: (MyChart) Due to this being a telephonic visit, the after visit summary with  patients personalized plan was offered to patient via MyChart   Notes: Nothing significant to report at this time.

## 2024-05-15 ENCOUNTER — Encounter: Payer: Self-pay | Admitting: Family Medicine

## 2024-05-15 NOTE — Telephone Encounter (Signed)
 Ok to place referral.  Jinny Mounts. Daneil Dunker, MD 05/15/2024 2:38 PM

## 2024-05-15 NOTE — Telephone Encounter (Signed)
**Note De-identified  [place] Obfuscation** Please advise 

## 2024-05-18 ENCOUNTER — Other Ambulatory Visit: Payer: Self-pay | Admitting: *Deleted

## 2024-05-19 ENCOUNTER — Other Ambulatory Visit: Payer: Self-pay | Admitting: *Deleted

## 2024-05-19 DIAGNOSIS — G4700 Insomnia, unspecified: Secondary | ICD-10-CM

## 2024-05-19 NOTE — Telephone Encounter (Signed)
 Referral placed.

## 2024-06-04 ENCOUNTER — Encounter: Payer: Self-pay | Admitting: Family Medicine

## 2024-06-04 NOTE — Telephone Encounter (Signed)
**Note De-identified  [place] Obfuscation** Please advise 

## 2024-06-05 NOTE — Telephone Encounter (Signed)
 We can perform X-rays on his hands to confirm arthritis. We can do this at this time of his appointment.  Jinny Mounts. Daneil Dunker, MD 06/05/2024 7:49 AM

## 2024-06-25 ENCOUNTER — Encounter: Payer: Self-pay | Admitting: Family Medicine

## 2024-06-25 ENCOUNTER — Ambulatory Visit (INDEPENDENT_AMBULATORY_CARE_PROVIDER_SITE_OTHER): Admitting: Family Medicine

## 2024-06-25 VITALS — BP 113/77 | HR 83 | Temp 97.5°F | Ht 70.0 in | Wt 164.8 lb

## 2024-06-25 DIAGNOSIS — N529 Male erectile dysfunction, unspecified: Secondary | ICD-10-CM

## 2024-06-25 DIAGNOSIS — M1990 Unspecified osteoarthritis, unspecified site: Secondary | ICD-10-CM

## 2024-06-25 DIAGNOSIS — E785 Hyperlipidemia, unspecified: Secondary | ICD-10-CM | POA: Diagnosis not present

## 2024-06-25 DIAGNOSIS — R739 Hyperglycemia, unspecified: Secondary | ICD-10-CM | POA: Diagnosis not present

## 2024-06-25 DIAGNOSIS — Z0001 Encounter for general adult medical examination with abnormal findings: Secondary | ICD-10-CM | POA: Diagnosis not present

## 2024-06-25 DIAGNOSIS — Z125 Encounter for screening for malignant neoplasm of prostate: Secondary | ICD-10-CM

## 2024-06-25 LAB — COMPREHENSIVE METABOLIC PANEL WITH GFR
ALT: 39 U/L (ref 0–53)
AST: 27 U/L (ref 0–37)
Albumin: 4.8 g/dL (ref 3.5–5.2)
Alkaline Phosphatase: 71 U/L (ref 39–117)
BUN: 18 mg/dL (ref 6–23)
CO2: 26 meq/L (ref 19–32)
Calcium: 9.5 mg/dL (ref 8.4–10.5)
Chloride: 104 meq/L (ref 96–112)
Creatinine, Ser: 0.92 mg/dL (ref 0.40–1.50)
GFR: 80.03 mL/min
Glucose, Bld: 93 mg/dL (ref 70–99)
Potassium: 4.3 meq/L (ref 3.5–5.1)
Sodium: 140 meq/L (ref 135–145)
Total Bilirubin: 1 mg/dL (ref 0.2–1.2)
Total Protein: 7.1 g/dL (ref 6.0–8.3)

## 2024-06-25 LAB — CBC
HCT: 48.2 % (ref 39.0–52.0)
Hemoglobin: 16.3 g/dL (ref 13.0–17.0)
MCHC: 33.8 g/dL (ref 30.0–36.0)
MCV: 95.3 fl (ref 78.0–100.0)
Platelets: 224 K/uL (ref 150.0–400.0)
RBC: 5.05 Mil/uL (ref 4.22–5.81)
RDW: 13.1 % (ref 11.5–15.5)
WBC: 6.8 K/uL (ref 4.0–10.5)

## 2024-06-25 LAB — LIPID PANEL
Cholesterol: 177 mg/dL (ref 0–200)
HDL: 78.5 mg/dL
LDL Cholesterol: 85 mg/dL (ref 0–99)
NonHDL: 98.84
Total CHOL/HDL Ratio: 2
Triglycerides: 71 mg/dL (ref 0.0–149.0)
VLDL: 14.2 mg/dL (ref 0.0–40.0)

## 2024-06-25 LAB — TSH: TSH: 1.93 u[IU]/mL (ref 0.35–5.50)

## 2024-06-25 LAB — HEMOGLOBIN A1C: Hgb A1c MFr Bld: 5.5 % (ref 4.6–6.5)

## 2024-06-25 LAB — PSA: PSA: 3.79 ng/mL (ref 0.10–4.00)

## 2024-06-25 NOTE — Progress Notes (Signed)
 Chief Complaint:  Kristopher Martinez is a 78 y.o. male who presents today for his annual comprehensive physical exam.    Assessment/Plan:  New/Acute Problems: Hand Pain  Likely secondary to osteoarthritis.  He can continue with ibuprofen and Voltaren.  Instructed patient to use Voltaren 3-4 times a day for maximal effect.  Also okay for him to use Tylenol  as needed but will check plain films today to further assess.  We did discuss referral to PT or sports medicine however he deferred for today.  He will let us  know if he changes his mind..  Chronic Problems Addressed Today: Osteoarthritis Will be seeing physical medicine soon.  Likely the source of his above hand pain.  He has been using ibuprofen and Voltaren.  Will checking x-rays of his hands today as above.  He will let us  know if he needs referral to see sports medicine or physical therapy.  Dyslipidemia Check lipids.  He is on Lipitor 20 mg daily.  Tolerating well.  Hyperglycemia Check A1c.   Erectile dysfunction Stable on Cialis  as needed.  Check PSA.  Preventative Healthcare: Check labs.  Up-to-date on vaccines.  Due for colonoscopy later this year.  Patient Counseling(The following topics were reviewed and/or handout was given):  -Nutrition: Stressed importance of moderation in sodium/caffeine intake, saturated fat and cholesterol, caloric balance, sufficient intake of fresh fruits, vegetables, and fiber.  -Stressed the importance of regular exercise.   -Substance Abuse: Discussed cessation/primary prevention of tobacco, alcohol, or other drug use; driving or other dangerous activities under the influence; availability of treatment for abuse.   -Injury prevention: Discussed safety belts, safety helmets, smoke detector, smoking near bedding or upholstery.   -Sexuality: Discussed sexually transmitted diseases, partner selection, use of condoms, avoidance of unintended pregnancy and contraceptive alternatives.   -Dental health:  Discussed importance of regular tooth brushing, flossing, and dental visits.  -Health maintenance and immunizations reviewed. Please refer to Health maintenance section.  Return to care in 1 year for next preventative visit.     Subjective:  HPI:  See Assessment / plan for status of chronic conditions. Patient here today for annual physical.   He has been having more pain in his hands the last several months. He did recently start golfing as a hobby but not sure if this caused the issue. He has tried using ibuprofen and Voltaren with some improvement   Lifestyle Diet: Balanced. Plenty of fruits and vegetables.  Exercise: Golfing more recently.      06/25/2024    8:25 AM  Depression screen PHQ 2/9  Decreased Interest 0  Down, Depressed, Hopeless 0  PHQ - 2 Score 0    Health Maintenance Due  Topic Date Due   Colonoscopy  10/11/2024     ROS: Per HPI, otherwise a complete review of systems was negative.   PMH:  The following were reviewed and entered/updated in epic: Past Medical History:  Diagnosis Date   Flu    03/23/14     Hemorrhoid    Hyperlipidemia    IBS (irritable bowel syndrome)    Low back pain    Sinusitis    03/23/14   Syncope 1995   did all testing-post-prandal-pt.states was told had long QT segment   Patient Active Problem List   Diagnosis Date Noted   Hyperglycemia 12/08/2020   Long Q-T syndrome 06/07/2020   Osteoarthritis 06/03/2020   Insomnia 08/13/2019   Eczema 01/30/2019   Seborrheic dermatitis 01/30/2019   Erectile dysfunction 01/30/2019  Dyslipidemia 09/29/2007   Past Surgical History:  Procedure Laterality Date   APPENDECTOMY     COLONOSCOPY  2008   HYPERPLASTIC POLYPS X 8    COLONOSCOPY  02/05/2018   Dr.Stark   EYE SURGERY     strabismus as child   HYPERPLASIA TISSUE EXCISION     atypical lymphoid    INGUINAL HERNIA REPAIR Right 04/08/2014   Procedure: LAPAROSCOPIC RIGHT INGUINAL HERNIA REPAIR;  Surgeon: Camellia CHRISTELLA Blush, MD;   Location: [facility];  Service: General;  Laterality: Right;   INSERTION OF MESH Right 04/08/2014   Procedure: INSERTION OF MESH;  Surgeon: Camellia CHRISTELLA Blush, MD;  Location: [facility];  Service: General;  Laterality: Right;   POLYPECTOMY     TONSILLECTOMY     WISDOM TOOTH EXTRACTION      Family History  Problem Relation Age of Onset   Colitis Mother    Alcohol abuse Mother    Cancer Father        prostate cancer   Prostate cancer Father    Prostate cancer Other    Colon cancer Neg Hx    Colon polyps Neg Hx    Esophageal cancer Neg Hx    Rectal cancer Neg Hx    Stomach cancer Neg Hx     Medications- reviewed and updated Current Outpatient Medications  Medication Sig Dispense Refill   atorvastatin  (LIPITOR) 20 MG tablet TAKE ONE TABLET BY MOUTH ONCE A DAY 90 tablet 0   cholecalciferol (VITAMIN D3) 25 MCG (1000 UNIT) tablet Take 2,000 Units by mouth daily.     desonide  (DESOWEN ) 0.05 % cream APPLY TO AFFECTED AREA TWICE A DAY 30 g 0   Flaxseed Oil OIL 700 mg.     glucosamine-chondroitin 500-400 MG tablet Take 1 tablet by mouth 3 (three) times daily.     ibuprofen (ADVIL,MOTRIN) 200 MG tablet Take 200 mg by mouth every 6 (six) hours as needed for mild pain.     METAMUCIL FIBER PO Take by mouth.     OVER THE COUNTER MEDICATION Take 1 tablet by mouth at bedtime. MELATONIN     tadalafil  (CIALIS ) 10 MG tablet Take 1 tablet (10 mg total) by mouth daily as needed for erectile dysfunction. 30 tablet 0   triamcinolone  cream (KENALOG ) 0.1 % APPLY TOPICALLY TWICE A DAY 60 g 0   Turmeric-Ginger-Black Pepper 125-6-50 MG-MG-MCG CHEW Chew by mouth.     No current facility-administered medications for this visit.    Allergies-reviewed and updated Allergies  Allergen Reactions   Augmentin  [Amoxicillin -Pot Clavulanate] Other (See Comments)    Nausea, vomiting, dehydration had to go to hospital.   Amoxicillin     Trazodone  And Nefazodone     Nausea and flushing. Possible passing out?   Cleocin  [Clindamycin Hcl] Rash    Social History   Socioeconomic History   Marital status: Married    Spouse name: Not on file   Number of children: 0   Years of education: Not on file   Highest education level: Professional school degree (e.g., MD, DDS, DVM, JD)  Occupational History   Not on file  Tobacco Use   Smoking status: Former    Current packs/day: 0.00    Types: Cigarettes    Quit date: 12/31/1976    Years since quitting: 47.5   Smokeless tobacco: Never  Vaping Use   Vaping status: Never Used  Substance and Sexual Activity   Alcohol use: Yes    Alcohol/week: 2.0 standard drinks of alcohol  Types: 2 Glasses of wine per week   Drug use: No   Sexual activity: Yes  Other Topics Concern   Not on file  Social History Narrative   Not on file   Social Drivers of Health   Financial Resource Strain: Low Risk  (06/19/2024)   Overall Financial Resource Strain (CARDIA)    Difficulty of Paying Living Expenses: Not hard at all  Food Insecurity: No Food Insecurity (06/19/2024)   Hunger Vital Sign    Worried About Running Out of Food in the Last Year: Never true    Ran Out of Food in the Last Year: Never true  Transportation Needs: No Transportation Needs (06/19/2024)   PRAPARE - Administrator, Civil Service (Medical): No    Lack of Transportation (Non-Medical): No  Physical Activity: Insufficiently Active (06/19/2024)   Exercise Vital Sign    Days of Exercise per Week: 3 days    Minutes of Exercise per Session: 40 min  Stress: No Stress Concern Present (06/19/2024)   [employer] of Occupational Health - Occupational Stress Questionnaire    Feeling of Stress: Only a little  Social Connections: Moderately Isolated (06/19/2024)   Social Connection and Isolation Panel    Frequency of Communication with Friends and Family: Never    Frequency of Social Gatherings with Friends and Family: Once a week    Attends Religious Services: Never    Database administrator or  Organizations: Yes    Attends Engineer, structural: 1 to 4 times per year    Marital Status: Married        Objective:  Physical Exam: BP 113/77   Pulse 83   Temp (!) 97.5 F (36.4 C) (Temporal)   Ht 5' 10 (1.778 m)   Wt 164 lb 12.8 oz (74.8 kg)   SpO2 97%   BMI 23.65 kg/m   Body mass index is 23.65 kg/m. Wt Readings from Last 3 Encounters:  06/25/24 164 lb 12.8 oz (74.8 kg)  04/22/24 164 lb (74.4 kg)  02/06/24 164 lb (74.4 kg)   Gen: NAD, resting comfortably HEENT: TMs normal bilaterally. OP clear. No thyromegaly noted.  CV: RRR with no murmurs appreciated Pulm: NWOB, CTAB with no crackles, wheezes, or rhonchi GI: Normal bowel sounds present. Soft, Nontender, Nondistended. MSK: no edema, cyanosis, or clubbing noted.  Bilateral hands with bony hypertrophy noted. Skin: warm, dry Neuro: CN2-12 grossly intact. Strength 5/5 in upper and lower extremities. Reflexes symmetric and intact bilaterally.  Psych: Normal affect and thought content     Christopherjohn Schiele M. Kennyth, MD 06/25/2024 9:02 AM

## 2024-06-25 NOTE — Assessment & Plan Note (Signed)
Check lipids. He is on Lipitor 20 mg daily. Tolerating well.

## 2024-06-25 NOTE — Assessment & Plan Note (Signed)
 Stable on Cialis  as needed.  Check PSA.

## 2024-06-25 NOTE — Patient Instructions (Signed)
 It was very nice to see you today!  You probably have arthritis in your hands.  It is okay for you to continue with the ibuprofen.  Please use the Voltaren 3-4 times per day.  Will check an x-ray today to further assess.  We will check blood work today.  Please continue to work on diet and exercise.  I will see back in year for your next physical.  Please come back sooner if needed.  Return in about 1 year (around 06/25/2025) for Annual Physical.   Take care, Dr Kennyth  PLEASE NOTE:  If you had any lab tests, please let us  know if you have not heard back within a few days. You may see your results on mychart before we have a chance to review them but we will give you a call once they are reviewed by us .   If we ordered any referrals today, please let us  know if you have not heard from their office within the next week.   If you had any urgent prescriptions sent in today, please check with the pharmacy within an hour of our visit to make sure the prescription was transmitted appropriately.   Please try these tips to maintain a healthy lifestyle:  Eat at least 3 REAL meals and 1-2 snacks per day.  Aim for no more than 5 hours between eating.  If you eat breakfast, please do so within one hour of getting up.   Each meal should contain half fruits/vegetables, one quarter protein, and one quarter carbs (no bigger than a computer mouse)  Cut down on sweet beverages. This includes juice, soda, and sweet tea.   Drink at least 1 glass of water with each meal and aim for at least 8 glasses per day  Exercise at least 150 minutes every week.    Preventive Care 81 Years and Older, Male Preventive care refers to lifestyle choices and visits with your health care provider that can promote health and wellness. Preventive care visits are also called wellness exams. What can I expect for my preventive care visit? Counseling During your preventive care visit, your health care provider may ask about  your: Medical history, including: Past medical problems. Family medical history. History of falls. Current health, including: Emotional well-being. Home life and relationship well-being. Sexual activity. Memory and ability to understand (cognition). Lifestyle, including: Alcohol, nicotine or tobacco, and drug use. Access to firearms. Diet, exercise, and sleep habits. Work and work Astronomer. Sunscreen use. Safety issues such as seatbelt and bike helmet use. Physical exam Your health care provider will check your: Height and weight. These may be used to calculate your BMI (body mass index). BMI is a measurement that tells if you are at a healthy weight. Waist circumference. This measures the distance around your waistline. This measurement also tells if you are at a healthy weight and may help predict your risk of certain diseases, such as type 2 diabetes and high blood pressure. Heart rate and blood pressure. Body temperature. Skin for abnormal spots. What immunizations do I need?  Vaccines are usually given at various ages, according to a schedule. Your health care provider will recommend vaccines for you based on your age, medical history, and lifestyle or other factors, such as travel or where you work. What tests do I need? Screening Your health care provider may recommend screening tests for certain conditions. This may include: Lipid and cholesterol levels. Diabetes screening. This is done by checking your blood sugar (glucose) after  you have not eaten for a while (fasting). Hepatitis C test. Hepatitis B test. HIV (human immunodeficiency virus) test. STI (sexually transmitted infection) testing, if you are at risk. Lung cancer screening. Colorectal cancer screening. Prostate cancer screening. Abdominal aortic aneurysm (AAA) screening. You may need this if you are a current or former smoker. Talk with your health care provider about your test results, treatment options,  and if necessary, the need for more tests. Follow these instructions at home: Eating and drinking  Eat a diet that includes fresh fruits and vegetables, whole grains, lean protein, and low-fat dairy products. Limit your intake of foods with high amounts of sugar, saturated fats, and salt. Take vitamin and mineral supplements as recommended by your health care provider. Do not drink alcohol if your health care provider tells you not to drink. If you drink alcohol: Limit how much you have to 0-2 drinks a day. Know how much alcohol is in your drink. In the [place] one drink equals one 12 oz bottle of beer (355 mL), one 5 oz glass of wine (148 mL), or one 1 oz glass of hard liquor (44 mL). Lifestyle Brush your teeth every morning and night with fluoride toothpaste. Floss one time each day. Exercise for at least 30 minutes 5 or more days each week. Do not use any products that contain nicotine or tobacco. These products include cigarettes, chewing tobacco, and vaping devices, such as e-cigarettes. If you need help quitting, ask your health care provider. Do not use drugs. If you are sexually active, practice safe sex. Use a condom or other form of protection to prevent STIs. Take aspirin only as told by your health care provider. Make sure that you understand how much to take and what form to take. Work with your health care provider to find out whether it is safe and beneficial for you to take aspirin daily. Ask your health care provider if you need to take a cholesterol-lowering medicine (statin). Find healthy ways to manage stress, such as: Meditation, yoga, or listening to music. Journaling. Talking to a trusted person. Spending time with friends and family. Safety Always wear your seat belt while driving or riding in a vehicle. Do not drive: If you have been drinking alcohol. Do not ride with someone who has been drinking. When you are tired or distracted. While texting. If you have been  using any mind-altering substances or drugs. Wear a helmet and other protective equipment during sports activities. If you have firearms in your house, make sure you follow all gun safety procedures. Minimize exposure to UV radiation to reduce your risk of skin cancer. What's next? Visit your health care provider once a year for an annual wellness visit. Ask your health care provider how often you should have your eyes and teeth checked. Stay up to date on all vaccines. This information is not intended to replace advice given to you by your health care provider. Make sure you discuss any questions you have with your health care provider. Document Revised: 06/14/2021 Document Reviewed: 06/14/2021 Elsevier Patient Education  2024 [employer].

## 2024-06-25 NOTE — Telephone Encounter (Signed)
 Please schedule a Xray appt for Monday about 11:30

## 2024-06-25 NOTE — Assessment & Plan Note (Signed)
 Check A1c.

## 2024-06-25 NOTE — Assessment & Plan Note (Signed)
 Will be seeing physical medicine soon.  Likely the source of his above hand pain.  He has been using ibuprofen and Voltaren.  Will checking x-rays of his hands today as above.  He will let us  know if he needs referral to see sports medicine or physical therapy.

## 2024-06-26 ENCOUNTER — Ambulatory Visit: Payer: Self-pay | Admitting: Family Medicine

## 2024-06-26 NOTE — Progress Notes (Signed)
 Great news! Labs are all at goal. We can recheck again in a year.

## 2024-06-29 ENCOUNTER — Ambulatory Visit (INDEPENDENT_AMBULATORY_CARE_PROVIDER_SITE_OTHER)

## 2024-06-29 ENCOUNTER — Ambulatory Visit

## 2024-06-29 DIAGNOSIS — M1812 Unilateral primary osteoarthritis of first carpometacarpal joint, left hand: Secondary | ICD-10-CM | POA: Diagnosis not present

## 2024-06-29 DIAGNOSIS — M79641 Pain in right hand: Secondary | ICD-10-CM | POA: Diagnosis not present

## 2024-06-29 DIAGNOSIS — M79642 Pain in left hand: Secondary | ICD-10-CM | POA: Diagnosis not present

## 2024-06-29 DIAGNOSIS — M1990 Unspecified osteoarthritis, unspecified site: Secondary | ICD-10-CM

## 2024-06-29 DIAGNOSIS — M1811 Unilateral primary osteoarthritis of first carpometacarpal joint, right hand: Secondary | ICD-10-CM | POA: Diagnosis not present

## 2024-06-30 ENCOUNTER — Encounter: Admitting: Family Medicine

## 2024-06-30 NOTE — Progress Notes (Signed)
 His x-rays confirm arthritis.  He can continue with his current treatment plan.  We can refer him to sports medicine or physical therapy if needed if his pain is not improving.

## 2024-07-06 ENCOUNTER — Ambulatory Visit (INDEPENDENT_AMBULATORY_CARE_PROVIDER_SITE_OTHER): Admitting: Neurology

## 2024-07-06 ENCOUNTER — Encounter: Payer: Self-pay | Admitting: Neurology

## 2024-07-06 VITALS — BP 127/70 | HR 69 | Ht 70.0 in | Wt 163.0 lb

## 2024-07-06 DIAGNOSIS — I4581 Long QT syndrome: Secondary | ICD-10-CM | POA: Diagnosis not present

## 2024-07-06 DIAGNOSIS — R0683 Snoring: Secondary | ICD-10-CM | POA: Insufficient documentation

## 2024-07-06 DIAGNOSIS — F518 Other sleep disorders not due to a substance or known physiological condition: Secondary | ICD-10-CM | POA: Insufficient documentation

## 2024-07-06 DIAGNOSIS — G4700 Insomnia, unspecified: Secondary | ICD-10-CM | POA: Diagnosis not present

## 2024-07-06 NOTE — Patient Instructions (Signed)
 Healthy Living: Sleep In this video, you will learn why sleep is an important part of a healthy lifestyle. To view the content, go to this web address: [email or web address]  This video will expire on: 12/11/2025. If you need access to this video following this date, please reach out to the healthcare provider who assigned it to you. This information is not intended to replace advice given to you by your health care provider. Make sure you discuss any questions you have with your health care provider. Elsevier Patient Education  2024 [employer]Quality Sleep Information, Adult Quality sleep is important for your mental and physical health. It also improves your quality of life. Quality sleep means you: Are asleep for most of the time you are in bed. Fall asleep within 30 minutes. Wake up no more than once a night. Are awake for no longer than 20 minutes if you do wake up during the night. Most adults need 7-8 hours of quality sleep each night. How can poor sleep affect me? If you do not get enough quality sleep, you may have: Mood swings. Daytime sleepiness. Decreased alertness, reaction time, and concentration. Sleep disorders, such as insomnia and sleep apnea. Difficulty with: Solving problems. Coping with stress. Paying attention. These issues may affect your performance and productivity at work, school, and home. Lack of sleep may also put you at higher risk for accidents, suicide, and risky behaviors. If you do not get quality sleep, you may also be at higher risk for several health problems, including: Infections. Type 2 diabetes. Heart disease. High blood pressure. Obesity. Worsening of long-term conditions, like arthritis, kidney disease, depression, Parkinson's disease, and epilepsy. What actions can I take to get more quality sleep? Sleep schedule and routine Stick to a sleep schedule. Go to sleep and wake up at about the same time each day. Do not try to  sleep less on weekdays and make up for lost sleep on weekends. This does not work. Limit naps during the day to 30 minutes or less. Do not take naps in the late afternoon. Make time to relax before bed. Reading, listening to music, or taking a hot bath promotes quality sleep. Make your bedroom a place that promotes quality sleep. Keep your bedroom dark, quiet, and at a comfortable room temperature. Make sure your bed is comfortable. Avoid using electronic devices that give off bright blue light for 30 minutes before bedtime. Your brain perceives bright blue light as sunlight. This includes television, phones, and computers. If you are lying awake in bed for longer than 20 minutes, get up and do a relaxing activity until you feel sleepy. Lifestyle     Try to get at least 30 minutes of exercise on most days. Do not exercise 2-3 hours before going to bed. Do not use any products that contain nicotine or tobacco. These products include cigarettes, chewing tobacco, and vaping devices, such as e-cigarettes. If you need help quitting, ask your health care provider. Do not drink caffeinated beverages for at least 8 hours before going to bed. Coffee, tea, and some sodas contain caffeine. Do not drink alcohol or eat large meals close to bedtime. Try to get at least 30 minutes of sunlight every day. Morning sunlight is best. Medical concerns Work with your health care provider to treat medical conditions that may affect sleeping, such as: Nasal obstruction. Snoring. Sleep apnea and other sleep disorders. Talk to your health care provider if you think any of your prescription medicines may cause you to  have difficulty falling or staying asleep. If you have sleep problems, talk with a sleep consultant. If you think you have a sleep disorder, talk with your health care provider about getting evaluated by a specialist. Where to find more information Sleep Foundation: sleepfoundation.org American Academy of  Sleep Medicine: aasm.org Centers for Disease Control and Prevention (CDC): TonerPromos.no Contact a health care provider if: You have trouble getting to sleep or staying asleep. You often wake up very early in the morning and cannot get back to sleep. You have daytime sleepiness. You have daytime sleep attacks of suddenly falling asleep and sudden muscle weakness (narcolepsy). You have a tingling sensation in your legs with a strong urge to move your legs (restless legs syndrome). You stop breathing briefly during sleep (sleep apnea). You think you have a sleep disorder or are taking a medicine that is affecting your quality of sleep. Summary Most adults need 7-8 hours of quality sleep each night. Getting enough quality sleep is important for your mental and physical health. Make your bedroom a place that promotes quality sleep, and avoid things that may cause you to have poor sleep, such as alcohol, caffeine, smoking, or large meals. Talk to your health care provider if you have trouble falling asleep or staying asleep. This information is not intended to replace advice given to you by your health care provider. Make sure you discuss any questions you have with your health care provider. Document Revised: 04/11/2022 Document Reviewed: 04/11/2022 Elsevier Patient Education  2024 [employer].

## 2024-07-06 NOTE — Progress Notes (Signed)
 SLEEP MEDICINE CLINIC    Provider:  Dedra Gores, MD  Primary Care Physician:  Kennyth Worth HERO, MD [address] [place] [address]     Referring Provider: Kennyth Worth HERO, Md [address] [place],  [place] [address]          Chief Complaint according to patient   Patient presents with:     New Patient (Initial Visit)           HISTORY OF PRESENT ILLNESS:  Kristopher Martinez is a 78 y.o. male patient who is seen upon referral on 07/06/2024 from  PCP Dr Kennyth  for a sleep medicine consultation.  Chief concern according to patient :  My fit bit shows too little sleep , I was snoring in spring but now not as loud anymore, have set routines for sleep time, habits, and listening to  classic music in bed.  His PCP's  previous suggestion for undergoing a sleep study had been at a busy time in his s life and he only now comes around.    I have the pleasure of seeing Kristopher Martinez 07/06/24 a right -handed male with a possible sleep disorder.   He has noted shorter sleep time totally at night, he sometimes has been unable to sleep through the night. He practices deep breathing.       Sleep relevant medical history:   likes to sleep with a TV on, set to classic music.  He has had a repeated problem with sinus infections, seasonal allergy related. He has been  snoring less.     Family medical /sleep history: No other family member on CPAP with OSA, insomnia, sleep walkers. He lost his 3 year younger sister in 2020 to alcoholism.    Social history:  Patient is retired from [employer] and lives in a household with spouse  Kristopher Martinez  ,  no children.    Pets are  present. One dog, small.  Tobacco use- quit at age 89 .   ETOH use - moderate , 3=4 a week, beer.  Caffeine intake in form of Coffee( 1-2 cups  in am ) Soda( 1 can pepsi zero ) Tea ( iced  tea) or energy drinks. No liquids after dinner-   Exercise in form of a physical fitness class- he likes to resume golfing.    Hobbies :films/ movies. .       Sleep habits are as follows: The patient's dinner time is between 6-7  PM.  No shower before dinner. The patient goes to bed at 10.30 PM and continues to sleep for 4-6 hours, wakes for one bathroom breaks, the first time at 1-4 AM. The later  the likelier to not get any more sleep-   he considers himself a morning person.  The preferred sleep position is  right lateral, but he snores often in supine, with the support of 1 firm pillows. Dreams are reportedly frequent/vivid.  The patient wakes up spontaneously/ without an alarm. 4-6  AM is the usual rise time. He reports not feeling refreshed or restored in AM, with symptoms such as dry mouth, but seldomly morning headaches, and always a bit of  residual fatigue.   Naps are taken  2-3 times a week ( frequently) , lasting from 15 to 35 minutes and are more refreshing than nocturnal sleep.    Review of Systems: Out of a complete 14 system review, the patient complains of only the following symptoms, and all  other reviewed systems are negative.:  Fatigue, sleepiness , snoring, fragmented sleep,  Nocturia once. Dry mouth.    How likely are you to doze in the following situations: 0 = not likely, 1 = slight chance, 2 = moderate chance, 3 = high chance   Sitting and Reading? Watching Television? Sitting inactive in a public place (theater or meeting)? As a passenger in a car for an hour without a break? Lying down in the afternoon when circumstances permit? Sitting and talking to someone? Sitting quietly after lunch without alcohol? In a car, while stopped for a few minutes in traffic?   Total = 1/ 24 points   FSS endorsed at 9/ 63 points.   GDS : 2/ 15.   Social History   Socioeconomic History   Marital status: Married    Spouse name: Not on file   Number of children: 0   Years of education: Not on file   Highest education level: Professional school degree (e.g., MD, DDS, DVM, JD)  Occupational  History   Not on file  Tobacco Use   Smoking status: Former    Current packs/day: 0.00    Types: Cigarettes    Quit date: 12/31/1976    Years since quitting: 47.5   Smokeless tobacco: Never  Vaping Use   Vaping status: Never Used  Substance and Sexual Activity   Alcohol use: Yes    Alcohol/week: 2.0 standard drinks of alcohol    Types: 2 Glasses of wine per week   Drug use: No   Sexual activity: Yes  Other Topics Concern   Not on file  Social History Narrative   Not on file   Social Drivers of Health   Financial Resource Strain: Low Risk  (06/19/2024)   Overall Financial Resource Strain (CARDIA)    Difficulty of Paying Living Expenses: Not hard at all  Food Insecurity: No Food Insecurity (06/19/2024)   Hunger Vital Sign    Worried About Running Out of Food in the Last Year: Never true    Ran Out of Food in the Last Year: Never true  Transportation Needs: No Transportation Needs (06/19/2024)   PRAPARE - Administrator, Civil Service (Medical): No    Lack of Transportation (Non-Medical): No  Physical Activity: Insufficiently Active (06/19/2024)   Exercise Vital Sign    Days of Exercise per Week: 3 days    Minutes of Exercise per Session: 40 min  Stress: No Stress Concern Present (06/19/2024)   [employer] of Occupational Health - Occupational Stress Questionnaire    Feeling of Stress: Only a little  Social Connections: Moderately Isolated (06/19/2024)   Social Connection and Isolation Panel    Frequency of Communication with Friends and Family: Never    Frequency of Social Gatherings with Friends and Family: Once a week    Attends Religious Services: Never    Database administrator or Organizations: Yes    Attends Engineer, structural: 1 to 4 times per year    Marital Status: Married    Family History  Problem Relation Age of Onset   Colitis Mother    Alcohol abuse Mother    Cancer Father        prostate cancer   Prostate cancer Father     Prostate cancer Other    Colon cancer Neg Hx    Colon polyps Neg Hx    Esophageal cancer Neg Hx    Rectal cancer Neg Hx    Stomach cancer  Neg Hx     Past Medical History:  Diagnosis Date   Flu    03/23/14     Hemorrhoid    Hyperlipidemia    IBS (irritable bowel syndrome)    Low back pain    Sinusitis    03/23/14   Syncope 1995   did all testing-post-prandal-pt.states was told had long QT segment    Past Surgical History:  Procedure Laterality Date   APPENDECTOMY     COLONOSCOPY  2008   HYPERPLASTIC POLYPS X 8    COLONOSCOPY  02/05/2018   Dr.Stark   EYE SURGERY     strabismus as child   HYPERPLASIA TISSUE EXCISION     atypical lymphoid    INGUINAL HERNIA REPAIR Right 04/08/2014   Procedure: LAPAROSCOPIC RIGHT INGUINAL HERNIA REPAIR;  Surgeon: Camellia CHRISTELLA Blush, MD;  Location: [facility];  Service: General;  Laterality: Right;   INSERTION OF MESH Right 04/08/2014   Procedure: INSERTION OF MESH;  Surgeon: Camellia CHRISTELLA Blush, MD;  Location: [facility];  Service: General;  Laterality: Right;   POLYPECTOMY     TONSILLECTOMY     WISDOM TOOTH EXTRACTION       Current Outpatient Medications on File Prior to Visit  Medication Sig Dispense Refill   atorvastatin  (LIPITOR) 20 MG tablet TAKE ONE TABLET BY MOUTH ONCE A DAY 90 tablet 0   cholecalciferol (VITAMIN D3) 25 MCG (1000 UNIT) tablet Take 2,000 Units by mouth daily.     desonide  (DESOWEN ) 0.05 % cream APPLY TO AFFECTED AREA TWICE A DAY 30 g 0   Flaxseed Oil OIL 700 mg.     glucosamine-chondroitin 500-400 MG tablet Take 1 tablet by mouth 3 (three) times daily.     ibuprofen (ADVIL,MOTRIN) 200 MG tablet Take 200 mg by mouth every 6 (six) hours as needed for mild pain.     METAMUCIL FIBER PO Take by mouth.     OVER THE COUNTER MEDICATION Take 1 tablet by mouth at bedtime. MELATONIN     tadalafil  (CIALIS ) 10 MG tablet Take 1 tablet (10 mg total) by mouth daily as needed for erectile dysfunction. 30 tablet 0   triamcinolone  cream (KENALOG ) 0.1 %  APPLY TOPICALLY TWICE A DAY 60 g 0   Turmeric-Ginger-Black Pepper 125-6-50 MG-MG-MCG CHEW Chew by mouth.     No current facility-administered medications on file prior to visit.    Allergies  Allergen Reactions   Augmentin  [Amoxicillin -Pot Clavulanate] Other (See Comments)    Nausea, vomiting, dehydration had to go to hospital.   Amoxicillin     Trazodone  And Nefazodone     Nausea and flushing. Possible passing out?   Cleocin [Clindamycin Hcl] Rash     DIAGNOSTIC DATA (LABS, IMAGING, TESTING) - I reviewed patient records, labs, notes, testing and imaging myself where available.  Lab Results  Component Value Date   WBC 6.8 06/25/2024   HGB 16.3 06/25/2024   HCT 48.2 06/25/2024   MCV 95.3 06/25/2024   PLT 224.0 06/25/2024      Component Value Date/Time   NA 140 06/25/2024 0907   K 4.3 06/25/2024 0907   CL 104 06/25/2024 0907   CO2 26 06/25/2024 0907   GLUCOSE 93 06/25/2024 0907   BUN 18 06/25/2024 0907   CREATININE 0.92 06/25/2024 0907   CREATININE 0.93 12/08/2020 0958   CALCIUM  9.5 06/25/2024 0907   PROT 7.1 06/25/2024 0907   ALBUMIN 4.8 06/25/2024 0907   AST 27 06/25/2024 0907   ALT 39 06/25/2024 0907  ALKPHOS 71 06/25/2024 0907   BILITOT 1.0 06/25/2024 0907   GFRNONAA >60 05/20/2022 1058   GFRAA >60 08/17/2019 0539   Lab Results  Component Value Date   CHOL 177 06/25/2024   HDL 78.50 06/25/2024   LDLCALC 85 06/25/2024   LDLDIRECT 114.9 06/05/2013   TRIG 71.0 06/25/2024   CHOLHDL 2 06/25/2024   Lab Results  Component Value Date   HGBA1C 5.5 06/25/2024   Lab Results  Component Value Date   VITAMINB12 303 12/03/2022   Lab Results  Component Value Date   TSH 1.93 06/25/2024    PHYSICAL EXAM:  Today's Vitals   07/06/24 1523  BP: 127/70  Pulse: 69  Weight: 163 lb (73.9 kg)  Height: 5' 10 (1.778 m)   Body mass index is 23.39 kg/m.   Wt Readings from Last 3 Encounters:  07/06/24 163 lb (73.9 kg)  06/25/24 164 lb 12.8 oz (74.8 kg)  04/22/24  164 lb (74.4 kg)     Ht Readings from Last 3 Encounters:  07/06/24 5' 10 (1.778 m)  06/25/24 5' 10 (1.778 m)  04/22/24 5' 10 (1.778 m)      General: The patient is awake, alert and appears not in acute distress. The patient is well groomed. He is dressed in sports wear.  Head: Normocephalic, atraumatic. Neck is supple. Mallampati 2,  neck circumference:15.5  inches . Nasal airflow is now patent.   Retrognathia is  seen.  Dental status:  biological  Cardiovascular:  Regular rate and cardiac rhythm by pulse,  without distended neck veins. Respiratory: Lungs are clear to auscultation.  Skin:  Without evidence of ankle edema, or rash. Trunk: The patient's posture is erect.   NEUROLOGIC EXAM: The patient is awake and alert, oriented to place and time.   Memory subjective described as intact.  Attention span & concentration ability appears normal.  Speech is fluent,  without  dysarthria,  mild dysphonia .  Mood and affect are appropriate.   Cranial nerves: no loss of smell or taste reported  Pupils are equal and briskly reactive to light.  Funduscopic exam : .  Extraocular movements in vertical and horizontal planes were intact and without nystagmus. No Diplopia. Visual fields by finger perimetry are intact. Hearing was intact to soft voice and finger rubbing.    Facial sensation intact to fine touch.  Facial motor strength is symmetric and tongue and uvula move midline.  Neck ROM : rotation, tilt and flexion extension were normal for age and shoulder shrug was symmetrical.    Motor exam:  Symmetric bulk, tone and ROM.   Normal tone without cog wheeling, symmetric grip strength .  He reports stiffness and pain in  fingers.    Sensory:  Fine touch, pinprick and vibration were tested  and  normal.  Proprioception tested in the upper extremities was normal.   Coordination: Rapid alternating movements in the fingers/hands were of normal speed.  The Finger-to-nose maneuver was  intact without evidence of ataxia, dysmetria or tremor.   Gait and station: Patient could rise unassisted from a seated position, walked without assistive device.  Stance is of normal width/ base and the patient turned with 3.5 steps.  Toe and heel walk were deferred.  Deep tendon reflexes: in the  upper and lower extremities are symmetric and intact.  Babinski response was deferred    ASSESSMENT AND PLAN:   78 y.o. year old male  here with:    1) sleep hygiene concerns. Wanting to improve  sleep efficiency .   we discussed a hot shower at bedtime, using no caffeine at dinner. Consider audio books instead of TV, no screens, using classic music played by another device.   Breathing exercises and reduction of liquids are to be continued.   Sleep journal to be kept.  We can talk after the cruise scheduled for next 3 weeks.   I plan to follow up  prn if mr Buer feels he would like to pursue a sleep study, either personally or through our NP .  I would like to thank  Kennyth Worth HERO, Md [address] [place],  [place] [address] for allowing me to meet with and to take care of this pleasant patient.   CC: I will share my notes with PCP.  After spending a total time of  45  minutes face to face and additional time for physical and neurologic examination, review of laboratory studies,  personal review of imaging studies, reports and results of other testing and review of referral information / records as far as provided in visit,   Electronically signed by: Dedra Gores, MD 07/06/2024 3:40 PM  [facility] Sleep Board certified by The [employer] of Sleep Medicine and Diplomate of the [employer] of Sleep Medicine. Board certified In Neurology through the ABPN, Fellow of the [employer] of Neurology.

## 2024-07-21 ENCOUNTER — Other Ambulatory Visit: Payer: Self-pay | Admitting: Family Medicine

## 2024-09-14 DIAGNOSIS — Z23 Encounter for immunization: Secondary | ICD-10-CM | POA: Diagnosis not present

## 2024-09-21 ENCOUNTER — Encounter: Payer: Self-pay | Admitting: Family Medicine

## 2024-09-21 ENCOUNTER — Other Ambulatory Visit: Payer: Self-pay | Admitting: *Deleted

## 2024-09-21 DIAGNOSIS — Z1211 Encounter for screening for malignant neoplasm of colon: Secondary | ICD-10-CM

## 2024-09-23 DIAGNOSIS — H524 Presbyopia: Secondary | ICD-10-CM | POA: Diagnosis not present

## 2024-10-22 ENCOUNTER — Encounter: Payer: Self-pay | Admitting: Gastroenterology

## 2024-10-22 ENCOUNTER — Ambulatory Visit: Admitting: Gastroenterology

## 2024-10-22 VITALS — BP 118/74 | HR 76 | Ht 70.0 in | Wt 169.6 lb

## 2024-10-22 DIAGNOSIS — K824 Cholesterolosis of gallbladder: Secondary | ICD-10-CM

## 2024-10-22 DIAGNOSIS — K5900 Constipation, unspecified: Secondary | ICD-10-CM | POA: Diagnosis not present

## 2024-10-22 DIAGNOSIS — Z860100 Personal history of colon polyps, unspecified: Secondary | ICD-10-CM

## 2024-10-22 MED ORDER — POLYETHYLENE GLYCOL 3350 17 G PO PACK: 17.0000 g | PACK | Freq: Every day | ORAL | Status: AC | PRN

## 2024-10-22 NOTE — Patient Instructions (Signed)
 Please purchase the following medications over the counter and take as directed: Miralax  - Take as needed  You have been scheduled for an abdominal ultrasound at [facility] (1st floor of hospital) on Tuesday, 11-03-24 at 9:15am. Please arrive 15 minutes prior to your appointment for registration. Make certain not to have anything to eat or drink 6 hours prior to your appointment. Should you need to reschedule your appointment, please contact radiology at [phone]. This test typically takes about 30 minutes to perform.   You will be due for an Office visit in 09-2026. We will send you a reminder in the mail when it gets closer to that time.   Thank you for entrusting me with your care and for choosing [facility], Dr. Elspeth Naval    _______________________________________________________  If your blood pressure at your visit was 140/90 or greater, please contact your primary care physician to follow up on this.  _______________________________________________________  If you are age 78 or older, your body mass index should be between 23-30. Your Body mass index is 24.33 kg/m. If this is out of the aforementioned range listed, please consider follow up with your Primary Care Provider.  If you are age 78 or younger, your body mass index should be between 19-25. Your Body mass index is 24.33 kg/m. If this is out of the aformentioned range listed, please consider follow up with your Primary Care Provider.   ________________________________________________________  The [facility] providers would like to encourage you to use MYCHART to communicate with providers for non-urgent requests or questions.  Due to long hold times on the telephone, sending your provider a message by [facility] may be a faster and more efficient way to get a response.  Please allow 48 business hours for a response.  Please remember that this is for non-urgent requests.   _______________________________________________________  [facility] is using a team-based approach to care.  Your team is made up of your doctor and two to three APPS. Our APPS (Nurse Practitioners and Physician Assistants) work with your physician to ensure care continuity for you. They are fully qualified to address your health concerns and develop a treatment plan. They communicate directly with your gastroenterologist to care for you. Seeing the Advanced Practice Practitioners on your physician's team can help you by facilitating care more promptly, often allowing for earlier appointments, access to diagnostic testing, procedures, and other specialty referrals.

## 2024-10-22 NOTE — Progress Notes (Signed)
 HPI :  78 year old male history of colon polyps, constipation, gallbladder polyp, here to reestablish his care for some of these issues.  He previously followed with Dr. Aneita, his last visit was just over a year ago, October 11, 2021.  He wonders if he needs another colonoscopy.  His last few exams are as below.  In February 2019 he had multiple polyps removed including 1 advanced adenoma which warranted a 3-year follow-up.  His last colonoscopy was on October 11, 2021.  At that point in time he had 1 adenoma removed, hemorrhoids, no other concerning pathology.  Based on that result he would not be due for another exam for 5 years given history of advanced adenoma in 2019.  He states in recent years he has had some trouble with constipation.  This has been going on for some time.  He tends to manage this by taking a fiber pill daily as well as 3 prunes per night.  Typically this will keep his bowels fairly regular and work okay for him.  He occasionally will need to use over-the-counter regimen such as MiraLAX  or other stimulants to move his bowels.  He states when he travels in particular is when he really can get bind up and does not use the bathroom.  He wonders how he can manage this when traveling.  He denies any blood in the stools, no abdominal pains.  No no medications.  He has no known family history of colon cancer.  Of note he has had some biliary colic symptoms in years past.  He had an ultrasound in May 2023 showing a 5 mm gallbladder polyp, he saw general surgery at the time and they elected to forego cholecystectomy as his symptoms resolved.  He was unaware of any plans for surveillance of the gallbladder polyp, we discussed if he wanted to pursue that.  He denies any biliary colic type symptoms currently and feels well.    Colonoscopy 10/11/21: - The perianal and digital rectal examinations were normal. - Four sessile polyps were found in the rectum (1), descending colon (2) and  transverse colon (1). The polyps were 6 to 8 mm in size. These polyps were removed with a cold snare. Resection and retrieval were complete. - Internal hemorrhoids were found during retroflexion. The hemorrhoids were small and Grade I (internal hemorrhoids that do not prolapse). - The exam was otherwise without abnormality on direct and retroflexion views.   1. Surgical [P], colon, transverse, polyp (1) - TUBULAR ADENOMA. - NO HIGH GRADE DYSPLASIA OR CARCINOMA. 2. Surgical [P], colon, rectum and descending, polyp (3) - HYPERPLASTIC POLYP (3). - NO ADENOMATOUS CHANGE OR CARCINOMA.  Colonoscopy 02/05/2018: - The perianal and digital rectal examinations were normal. - A 14 mm polyp was found in the cecum. The polyp was sessile. The polyp was removed with a hot snare. Resection and retrieval were complete. - Five sessile polyps were found in the sigmoid colon (3), descending colon and ascending colon. The polyps were 6 to 7 mm in size. These polyps were removed with a cold snare. Resection and retrieval were complete. - Two sessile polyps were found in the sigmoid colon and transverse colon. The polyps were 5 mm in size. These polyps were removed with a cold biopsy forceps. Resection and retrieval were complete. - Internal hemorrhoids were found during retroflexion. The hemorrhoids were small and Grade I (internal hemorrhoids that do not prolapse). - The exam was otherwise without abnormality on direct and retroflexion views.  1. Surgical [P], cecum, polyp - TUBULAR ADENOMA (X4 FRAGMENTS). - NO HIGH GRADE DYSPLASIA OR MALIGNANCY. 2. Surgical [P], ascending and transverse and descending and sigmoid - 4, polyp (7) - TUBULAR ADENOMA (X2 FRAGMENTS). - HYPERPLASTIC POLYP (X4 FRAGMENTS). - NO HIGH GRADE DYSPLASIA OR MALIGNANCY.   RUQ US  05/25/22: IMPRESSION: 1. No acute process identified. 2. 5 mm likely polyp in the gallbladder. 3. Inhomogeneous hepatic parenchyma which may represent hepatic steatosis  and/or other hepatocellular disease.    Past Medical History:  Diagnosis Date   Colon polyps 2019   Flu    03/23/14     Hemorrhoid    Hyperlipidemia    IBS (irritable bowel syndrome)    Long Q-T syndrome    Low back pain    Osteoarthritis    Sinusitis    03/23/14   Syncope 12/31/1993   did all testing-post-prandal-pt.states was told had long QT segment     Past Surgical History:  Procedure Laterality Date   APPENDECTOMY     COLONOSCOPY  2008   HYPERPLASTIC POLYPS X 8    COLONOSCOPY  02/05/2018   Dr.Stark   EYE SURGERY     strabismus as child   HYPERPLASIA TISSUE EXCISION     atypical lymphoid    INGUINAL HERNIA REPAIR Right 04/08/2014   Procedure: LAPAROSCOPIC RIGHT INGUINAL HERNIA REPAIR;  Surgeon: Camellia CHRISTELLA Blush, MD;  Location: [facility];  Service: General;  Laterality: Right;   INSERTION OF MESH Right 04/08/2014   Procedure: INSERTION OF MESH;  Surgeon: Camellia CHRISTELLA Blush, MD;  Location: [facility];  Service: General;  Laterality: Right;   POLYPECTOMY     TONSILLECTOMY     WISDOM TOOTH EXTRACTION     Family History  Problem Relation Age of Onset   Colitis Mother    Alcohol abuse Mother    Cancer Father        prostate cancer   Prostate cancer Father    Prostate cancer Other    Colon cancer Neg Hx    Colon polyps Neg Hx    Esophageal cancer Neg Hx    Rectal cancer Neg Hx    Stomach cancer Neg Hx    Social History   Tobacco Use   Smoking status: Former    Current packs/day: 0.00    Types: Cigarettes    Quit date: 12/31/1976    Years since quitting: 47.8   Smokeless tobacco: Never  Vaping Use   Vaping status: Never Used  Substance Use Topics   Alcohol use: Yes    Alcohol/week: 2.0 standard drinks of alcohol    Types: 2 Glasses of wine per week   Drug use: No   Current Outpatient Medications  Medication Sig Dispense Refill   atorvastatin  (LIPITOR) 20 MG tablet TAKE ONE TABLET BY MOUTH ONCE A DAY 90 tablet 0   cholecalciferol (VITAMIN D3) 25 MCG (1000 UNIT)  tablet Take 2,000 Units by mouth daily.     desonide  (DESOWEN ) 0.05 % cream APPLY TO AFFECTED AREA TWICE A DAY 30 g 0   Flaxseed Oil OIL 700 mg.     glucosamine-chondroitin 500-400 MG tablet Take 1 tablet by mouth 3 (three) times daily.     ibuprofen (ADVIL,MOTRIN) 200 MG tablet Take 200 mg by mouth every 6 (six) hours as needed for mild pain.     METAMUCIL FIBER PO Take by mouth.     OVER THE COUNTER MEDICATION Take 1 tablet by mouth at bedtime. MELATONIN     tadalafil  (  CIALIS ) 10 MG tablet Take 1 tablet (10 mg total) by mouth daily as needed for erectile dysfunction. 30 tablet 0   triamcinolone  cream (KENALOG ) 0.1 % APPLY TOPICALLY TWICE A DAY 60 g 0   No current facility-administered medications for this visit.   Allergies  Allergen Reactions   Augmentin  [Amoxicillin -Pot Clavulanate] Other (See Comments)    Nausea, vomiting, dehydration had to go to hospital.   Amoxicillin     Trazodone  And Nefazodone     Nausea and flushing. Possible passing out?   Cleocin [Clindamycin Hcl] Rash     Review of Systems: All systems reviewed and negative except where noted in HPI.   Lab Results  Component Value Date   WBC 6.8 06/25/2024   HGB 16.3 06/25/2024   HCT 48.2 06/25/2024   MCV 95.3 06/25/2024   PLT 224.0 06/25/2024    Lab Results  Component Value Date   NA 140 06/25/2024   CL 104 06/25/2024   K 4.3 06/25/2024   CO2 26 06/25/2024   BUN 18 06/25/2024   CREATININE 0.92 06/25/2024   GFR 80.03 06/25/2024   CALCIUM  9.5 06/25/2024   ALBUMIN 4.8 06/25/2024   GLUCOSE 93 06/25/2024    Lab Results  Component Value Date   ALT 39 06/25/2024   AST 27 06/25/2024   ALKPHOS 71 06/25/2024   BILITOT 1.0 06/25/2024     Physical Exam: BP 118/74   Pulse 76   Ht 5' 10 (1.778 m)   Wt 169 lb 9 oz (76.9 kg)   BMI 24.33 kg/m  Constitutional: Pleasant,well-developed, male in no acute distress. HEENT: Normocephalic and atraumatic. Conjunctivae are normal. No scleral icterus. Neck  supple.  Cardiovascular: Normal rate, regular rhythm.  Pulmonary/chest: Effort normal and breath sounds normal. No wheezing, rales or rhonchi. Abdominal: Soft, nondistended, nontender. There are no masses palpable. No hepatomegaly. Extremities: no edema Lymphadenopathy: No cervical adenopathy noted. Neurological: Alert and oriented to person place and time. Skin: Skin is warm and dry. No rashes noted. Psychiatric: Normal mood and affect. Behavior is normal.   ASSESSMENT: 78 y.o. male here for assessment of the following  1. Constipation, unspecified constipation type   2. History of colon polyps   3. Gallbladder polyp    Discussed his bowel habits over the years, certainly has some baseline constipation and can worsen with travel which is not uncommon.  If the daily regimen works for him while he is at home, can continue high-fiber diet/fiber supplement and prunes.  When traveling is when he specifically has more problems.  I recommend he take MiraLAX  daily while traveling or before travel to help prevent constipation.  He can titrate that up as needed.  He agrees with this, can use as needed at home as well.  Reassured him his colonoscopy is up-to-date without any high risk pathology.  We discussed his history of colon polyps, he was under the impression he was due for colonoscopy now.  Based on national surveillance guidelines he would not be due next until October 2027, 5 years from his last exam.  At that point he will be 78 years old and we discussed if he wanted to pursue a colonoscopy at that age.  He is otherwise quite healthy for his age, life expectancy could be another 10 years.  I offered him another colonoscopy at age 29 if he wants to pursue that.  He will see me in the office at that time to discuss it further but his preference would be to do  1 more exam prior to stopping which I think is reasonable given his history of advanced adenomas.  Otherwise reviewed his history of  biliary colic and gallbladder polyp a few years ago.  We discussed that based on what guidelines you look at, management of gallbladder polyps can vary.  Some opt for no surveillance for gallbladder polyps less than 6 mm, others recommend surveillance ultrasound in 1 year for patients over the age of 106 with gallbladder polyps regardless of the size, given potential risk for gallbladder malignancy with gallbladder polyps.  He is feeling well, discussed if he wanted to pursue a surveillance ultrasound to reassess the size of this.  Following discussion he wanted to pursue the ultrasound, will relay results and recommendations once that has been done.  He agrees.  PLAN: - continue bowel regimen at home-high-fiber diet, prunes etc. - take Miralax  PRN especially when traveling - consideration for surveillance colonoscopy in 09/2026 - he can see me at that time to discuss further but his preference would be to do 1 more exam prior to stopping - coordinate RUQ US  - surveillance GB polyp  Marcey Naval, MD [facility]  CC: Kennyth Worth HERO, MD

## 2024-10-29 ENCOUNTER — Other Ambulatory Visit: Payer: Self-pay | Admitting: Family Medicine

## 2024-11-03 ENCOUNTER — Ambulatory Visit (HOSPITAL_COMMUNITY)
Admission: RE | Admit: 2024-11-03 | Discharge: 2024-11-03 | Disposition: A | Discharge status: Home / Self Care | Source: Ambulatory Visit | Attending: Gastroenterology | Admitting: Gastroenterology

## 2024-11-03 DIAGNOSIS — K824 Cholesterolosis of gallbladder: Secondary | ICD-10-CM | POA: Insufficient documentation

## 2024-11-05 ENCOUNTER — Ambulatory Visit: Payer: Self-pay | Admitting: Gastroenterology

## 2024-12-04 ENCOUNTER — Encounter: Payer: Self-pay | Admitting: Family Medicine

## 2025-01-28 ENCOUNTER — Other Ambulatory Visit: Payer: Self-pay | Admitting: Family Medicine

## 2025-04-29 ENCOUNTER — Ambulatory Visit
